# Patient Record
Sex: Male | Born: 1965 | Race: Black or African American | Hispanic: No | Smoking: Never smoker
Health system: Southern US, Community
[De-identification: ages and names within clinical notes are randomized; demographics above are authoritative.]

## PROBLEM LIST (undated history)

## (undated) DIAGNOSIS — M810 Age-related osteoporosis without current pathological fracture: Secondary | ICD-10-CM

## (undated) DIAGNOSIS — I10 Essential (primary) hypertension: Secondary | ICD-10-CM

## (undated) DIAGNOSIS — M1990 Unspecified osteoarthritis, unspecified site: Secondary | ICD-10-CM

## (undated) DIAGNOSIS — M069 Rheumatoid arthritis, unspecified: Secondary | ICD-10-CM

## (undated) DIAGNOSIS — T7840XA Allergy, unspecified, initial encounter: Secondary | ICD-10-CM

## (undated) HISTORY — PX: FOOT SURGERY: SHX648

## (undated) HISTORY — DX: Allergy, unspecified, initial encounter: T78.40XA

## (undated) HISTORY — DX: Rheumatoid arthritis, unspecified: M06.9

## (undated) HISTORY — DX: Essential (primary) hypertension: I10

## (undated) HISTORY — DX: Age-related osteoporosis without current pathological fracture: M81.0

---

## 2001-06-09 HISTORY — PX: KNEE SURGERY: SHX244

## 2003-03-30 ENCOUNTER — Encounter (INDEPENDENT_AMBULATORY_CARE_PROVIDER_SITE_OTHER): Payer: Self-pay | Admitting: Specialist

## 2003-03-30 ENCOUNTER — Ambulatory Visit (HOSPITAL_COMMUNITY): Admission: RE | Admit: 2003-03-30 | Discharge: 2003-03-30 | Payer: Self-pay | Admitting: Specialist

## 2006-08-28 ENCOUNTER — Emergency Department (HOSPITAL_COMMUNITY): Admission: EM | Admit: 2006-08-28 | Discharge: 2006-08-28 | Payer: Self-pay | Admitting: Emergency Medicine

## 2008-12-07 ENCOUNTER — Emergency Department (HOSPITAL_COMMUNITY): Admission: EM | Admit: 2008-12-07 | Discharge: 2008-12-07 | Payer: Self-pay | Admitting: Family Medicine

## 2009-05-08 ENCOUNTER — Emergency Department (HOSPITAL_COMMUNITY): Admission: EM | Admit: 2009-05-08 | Discharge: 2009-05-08 | Payer: Self-pay | Admitting: Emergency Medicine

## 2010-01-17 ENCOUNTER — Encounter: Admission: RE | Admit: 2010-01-17 | Discharge: 2010-01-17 | Payer: Self-pay | Admitting: Podiatry

## 2010-06-30 ENCOUNTER — Encounter: Payer: Self-pay | Admitting: Podiatry

## 2010-09-11 LAB — POCT URINALYSIS DIP (DEVICE)
Bilirubin Urine: NEGATIVE
Glucose, UA: NEGATIVE mg/dL
Ketones, ur: 15 mg/dL — AB
Nitrite: NEGATIVE
Protein, ur: 100 mg/dL — AB
Specific Gravity, Urine: 1.025 (ref 1.005–1.030)
Urobilinogen, UA: 0.2 mg/dL (ref 0.0–1.0)
pH: 6 (ref 5.0–8.0)

## 2010-09-11 LAB — DIFFERENTIAL
Basophils Absolute: 0.1 10*3/uL (ref 0.0–0.1)
Basophils Relative: 1 % (ref 0–1)
Eosinophils Absolute: 0 10*3/uL (ref 0.0–0.7)
Eosinophils Relative: 0 % (ref 0–5)
Lymphocytes Relative: 6 % — ABNORMAL LOW (ref 12–46)
Lymphs Abs: 0.7 10*3/uL (ref 0.7–4.0)
Monocytes Absolute: 0.2 10*3/uL (ref 0.1–1.0)
Monocytes Relative: 2 % — ABNORMAL LOW (ref 3–12)
Neutro Abs: 10.4 10*3/uL — ABNORMAL HIGH (ref 1.7–7.7)
Neutrophils Relative %: 91 % — ABNORMAL HIGH (ref 43–77)

## 2010-09-11 LAB — CBC
HCT: 43.6 % (ref 39.0–52.0)
Hemoglobin: 14.6 g/dL (ref 13.0–17.0)
MCHC: 33.4 g/dL (ref 30.0–36.0)
MCV: 80.2 fL (ref 78.0–100.0)
Platelets: 239 10*3/uL (ref 150–400)
RBC: 5.44 MIL/uL (ref 4.22–5.81)
RDW: 13.7 % (ref 11.5–15.5)
WBC: 11.5 10*3/uL — ABNORMAL HIGH (ref 4.0–10.5)

## 2010-10-25 NOTE — Op Note (Signed)
NAME:  Anthony Boyd, Anthony Boyd                        ACCOUNT NO.:  [id number]   MEDICAL RECORD NO.:  [id number]                   PATIENT TYPE:  AMB   LOCATION:  DAY                                  FACILITY:  [facility]   PHYSICIAN:  Jene Every, M.D.                 DATE OF BIRTH:  08-Feb-1966   DATE OF PROCEDURE:  03/30/2003  DATE OF DISCHARGE:                                 OPERATIVE REPORT   PREOPERATIVE DIAGNOSIS:  Lateral meniscus tear, degenerative joint disease  of the left knee.   POSTOPERATIVE DIAGNOSIS:  Grade 3 chondromalacia lateral tibial plateau,  grade 3 chondromalacia of the patella, medial femoral condyle, lateral  meniscus tear, hypertrophic synovitis.   PROCEDURE PERFORMED:  Left knee arthroscopy, partial lateral meniscectomy,  chondroplasty of the lateral tibial plateau, medial femoral condyle, and  patella, synovial biopsy.   BRIEF HISTORY AND INDICATIONS:  45 year old with refractory knee pain.  MRI  indicating chondromalacia and possible meniscal tear.  Operative  intervention was indicated for diagnosis and treatment.  The risks and  benefits were discussed including bleeding, infection, no changes, etc.   SURGICAL TECHNIQUE:  With the patient in the supine position, after adequate  general anesthesia and 1 gram Kefzol, the left lower extremity was prepped  and draped in the usual sterile fashion.  A lateral parapatellar portal and  a superomedial parapatellar portal was fashioned with a #11 blade.  The  instruments were atraumatically placed.  Irrigant was utilized to insufflate  the joint.  Under direct visualization, the medial parapatellar portal was  fashioned with a #11 blade after localization with the 18 gauge needle  sparring the medial meniscus.  Examination of the suprapatellar pouch  revealed grade 3 chondromalacia of the patella, normal patellofemoral  tracking.  The gutters are unremarkable.  Examination of the intercondylar  notch revealed  hypertrophic synovitis that was fairly significant,  therefore, I introduced a pituitary and performed synovial biopsy and sent  to pathology.  There was an extensive grade 3 lesion of the medial femoral  condyle with a chondral flap tear.  Loose cartilaginous debris was noted  throughout the medial lateral compartment.  The shaver was introduced and  chondroplasty of the patella and medial femoral condyle, lateral tibial  plateau.  The lateral tibial plateau there was a carpet like bunching of the  chondral surface which I debrided with a shaver.  There was also a tear in  the posterolateral meniscus.  I introduced the basket and resected to a  stable base and further contoured it with a shaver.  The remainder of the  medial and lateral menisci were stable without evidence of a tear.  I  debrided hypertrophic synovitis.  The ACL and PCL were unremarkable.  I  copiously lavaged the knee and re-examined all compartments.  No loose  cartilaginous debris or residual meniscal tears were amenable to  arthroscopic intervention.  The knee  was copiously lavaged, all  instrumentation was removed.  The portals were closed with 4-0 nylon simple  sutures.  0.25% Marcaine with epinephrine was infiltrated in the joint.  The  wound was dressed sterilely.  The patient was awakened without difficulty  and transported to the recovery room in satisfactory condition.  The patient  tolerated the procedure well with no complications.                                               Jene Every, M.D.    Cordelia Pen  D:  03/30/2003  T:  03/30/2003  Job:  [id number]

## 2011-11-12 ENCOUNTER — Encounter (INDEPENDENT_AMBULATORY_CARE_PROVIDER_SITE_OTHER): Payer: Self-pay

## 2011-11-26 ENCOUNTER — Encounter (INDEPENDENT_AMBULATORY_CARE_PROVIDER_SITE_OTHER): Payer: Self-pay | Admitting: General Surgery

## 2011-11-27 ENCOUNTER — Ambulatory Visit (INDEPENDENT_AMBULATORY_CARE_PROVIDER_SITE_OTHER): Payer: BC Managed Care – PPO | Admitting: General Surgery

## 2012-11-24 ENCOUNTER — Ambulatory Visit (INDEPENDENT_AMBULATORY_CARE_PROVIDER_SITE_OTHER): Payer: BC Managed Care – PPO | Admitting: General Surgery

## 2012-11-29 ENCOUNTER — Ambulatory Visit (INDEPENDENT_AMBULATORY_CARE_PROVIDER_SITE_OTHER): Payer: BC Managed Care – PPO | Admitting: General Surgery

## 2012-12-16 ENCOUNTER — Encounter (INDEPENDENT_AMBULATORY_CARE_PROVIDER_SITE_OTHER): Payer: Self-pay | Admitting: General Surgery

## 2013-11-14 ENCOUNTER — Telehealth: Payer: Self-pay | Admitting: *Deleted

## 2013-11-14 NOTE — Telephone Encounter (Signed)
Patient returned my call.  I need a prescription refill I think it's for Demerol.  I informed him he hasn't been seen in a while.  He will need to call back on tomorrow and schedule an appointment with Dr. Ralene Cork.  He stated okay, I'll do that.

## 2013-11-14 NOTE — Telephone Encounter (Signed)
Calling about getting (message was muffled couldn't understand what was being said)   I left him a message to return my call.

## 2013-11-28 ENCOUNTER — Encounter (INDEPENDENT_AMBULATORY_CARE_PROVIDER_SITE_OTHER): Payer: Self-pay | Admitting: Surgery

## 2013-12-07 ENCOUNTER — Ambulatory Visit (INDEPENDENT_AMBULATORY_CARE_PROVIDER_SITE_OTHER): Payer: BC Managed Care – PPO | Admitting: Surgery

## 2013-12-11 ENCOUNTER — Other Ambulatory Visit: Payer: Self-pay

## 2015-07-24 ENCOUNTER — Other Ambulatory Visit: Payer: Self-pay | Admitting: Rheumatology

## 2015-07-24 DIAGNOSIS — M25561 Pain in right knee: Secondary | ICD-10-CM

## 2015-07-29 ENCOUNTER — Ambulatory Visit
Admission: RE | Admit: 2015-07-29 | Discharge: 2015-07-29 | Disposition: A | Discharge status: Home / Self Care | Payer: BLUE CROSS/BLUE SHIELD | Source: Ambulatory Visit | Attending: Rheumatology | Admitting: Rheumatology

## 2015-07-29 DIAGNOSIS — M25561 Pain in right knee: Secondary | ICD-10-CM

## 2015-09-11 DIAGNOSIS — Z125 Encounter for screening for malignant neoplasm of prostate: Secondary | ICD-10-CM | POA: Diagnosis not present

## 2015-09-11 DIAGNOSIS — Z13220 Encounter for screening for lipoid disorders: Secondary | ICD-10-CM | POA: Diagnosis not present

## 2015-09-11 DIAGNOSIS — Z0001 Encounter for general adult medical examination with abnormal findings: Secondary | ICD-10-CM | POA: Diagnosis not present

## 2015-09-18 DIAGNOSIS — M0579 Rheumatoid arthritis with rheumatoid factor of multiple sites without organ or systems involvement: Secondary | ICD-10-CM | POA: Diagnosis not present

## 2015-09-18 DIAGNOSIS — R7303 Prediabetes: Secondary | ICD-10-CM | POA: Diagnosis not present

## 2015-09-18 DIAGNOSIS — I10 Essential (primary) hypertension: Secondary | ICD-10-CM | POA: Diagnosis not present

## 2015-09-18 DIAGNOSIS — Z0000 Encounter for general adult medical examination without abnormal findings: Secondary | ICD-10-CM | POA: Diagnosis not present

## 2015-10-09 DIAGNOSIS — M25561 Pain in right knee: Secondary | ICD-10-CM | POA: Diagnosis not present

## 2015-10-24 DIAGNOSIS — M25561 Pain in right knee: Secondary | ICD-10-CM | POA: Diagnosis not present

## 2015-12-10 DIAGNOSIS — Z79899 Other long term (current) drug therapy: Secondary | ICD-10-CM | POA: Diagnosis not present

## 2015-12-10 DIAGNOSIS — M0579 Rheumatoid arthritis with rheumatoid factor of multiple sites without organ or systems involvement: Secondary | ICD-10-CM | POA: Diagnosis not present

## 2015-12-10 DIAGNOSIS — M25561 Pain in right knee: Secondary | ICD-10-CM | POA: Diagnosis not present

## 2015-12-10 DIAGNOSIS — M179 Osteoarthritis of knee, unspecified: Secondary | ICD-10-CM | POA: Diagnosis not present

## 2015-12-19 DIAGNOSIS — M1711 Unilateral primary osteoarthritis, right knee: Secondary | ICD-10-CM | POA: Diagnosis not present

## 2016-01-07 DIAGNOSIS — M179 Osteoarthritis of knee, unspecified: Secondary | ICD-10-CM | POA: Diagnosis not present

## 2016-01-07 DIAGNOSIS — Z79899 Other long term (current) drug therapy: Secondary | ICD-10-CM | POA: Diagnosis not present

## 2016-01-07 DIAGNOSIS — M0579 Rheumatoid arthritis with rheumatoid factor of multiple sites without organ or systems involvement: Secondary | ICD-10-CM | POA: Diagnosis not present

## 2016-01-07 DIAGNOSIS — M25561 Pain in right knee: Secondary | ICD-10-CM | POA: Diagnosis not present

## 2016-03-10 ENCOUNTER — Ambulatory Visit (INDEPENDENT_AMBULATORY_CARE_PROVIDER_SITE_OTHER): Payer: Self-pay | Admitting: Orthopaedic Surgery

## 2016-04-07 DIAGNOSIS — M0579 Rheumatoid arthritis with rheumatoid factor of multiple sites without organ or systems involvement: Secondary | ICD-10-CM | POA: Diagnosis not present

## 2016-04-07 DIAGNOSIS — Z23 Encounter for immunization: Secondary | ICD-10-CM | POA: Diagnosis not present

## 2016-04-07 DIAGNOSIS — M179 Osteoarthritis of knee, unspecified: Secondary | ICD-10-CM | POA: Diagnosis not present

## 2016-04-07 DIAGNOSIS — I10 Essential (primary) hypertension: Secondary | ICD-10-CM | POA: Diagnosis not present

## 2016-10-16 DIAGNOSIS — Z0000 Encounter for general adult medical examination without abnormal findings: Secondary | ICD-10-CM | POA: Diagnosis not present

## 2016-10-16 DIAGNOSIS — Z79899 Other long term (current) drug therapy: Secondary | ICD-10-CM | POA: Diagnosis not present

## 2016-10-16 DIAGNOSIS — M179 Osteoarthritis of knee, unspecified: Secondary | ICD-10-CM | POA: Diagnosis not present

## 2016-10-16 DIAGNOSIS — M0579 Rheumatoid arthritis with rheumatoid factor of multiple sites without organ or systems involvement: Secondary | ICD-10-CM | POA: Diagnosis not present

## 2016-10-16 DIAGNOSIS — M6530 Trigger finger, unspecified finger: Secondary | ICD-10-CM | POA: Diagnosis not present

## 2017-02-04 DIAGNOSIS — M0579 Rheumatoid arthritis with rheumatoid factor of multiple sites without organ or systems involvement: Secondary | ICD-10-CM | POA: Diagnosis not present

## 2017-02-04 DIAGNOSIS — M179 Osteoarthritis of knee, unspecified: Secondary | ICD-10-CM | POA: Diagnosis not present

## 2017-02-04 DIAGNOSIS — M6530 Trigger finger, unspecified finger: Secondary | ICD-10-CM | POA: Diagnosis not present

## 2017-02-04 DIAGNOSIS — Z79899 Other long term (current) drug therapy: Secondary | ICD-10-CM | POA: Diagnosis not present

## 2017-06-08 DIAGNOSIS — H40033 Anatomical narrow angle, bilateral: Secondary | ICD-10-CM | POA: Diagnosis not present

## 2017-06-08 DIAGNOSIS — H04123 Dry eye syndrome of bilateral lacrimal glands: Secondary | ICD-10-CM | POA: Diagnosis not present

## 2017-06-10 DIAGNOSIS — Z0000 Encounter for general adult medical examination without abnormal findings: Secondary | ICD-10-CM | POA: Diagnosis not present

## 2017-06-10 DIAGNOSIS — I10 Essential (primary) hypertension: Secondary | ICD-10-CM | POA: Diagnosis not present

## 2017-06-10 DIAGNOSIS — R7303 Prediabetes: Secondary | ICD-10-CM | POA: Diagnosis not present

## 2017-06-17 ENCOUNTER — Ambulatory Visit (INDEPENDENT_AMBULATORY_CARE_PROVIDER_SITE_OTHER): Payer: BLUE CROSS/BLUE SHIELD | Admitting: Physician Assistant

## 2017-06-17 ENCOUNTER — Ambulatory Visit (INDEPENDENT_AMBULATORY_CARE_PROVIDER_SITE_OTHER): Payer: Self-pay

## 2017-06-17 ENCOUNTER — Encounter (INDEPENDENT_AMBULATORY_CARE_PROVIDER_SITE_OTHER): Payer: Self-pay | Admitting: Physician Assistant

## 2017-06-17 VITALS — Ht 66.0 in | Wt 241.0 lb

## 2017-06-17 DIAGNOSIS — Z0000 Encounter for general adult medical examination without abnormal findings: Secondary | ICD-10-CM | POA: Diagnosis not present

## 2017-06-17 DIAGNOSIS — Z23 Encounter for immunization: Secondary | ICD-10-CM | POA: Diagnosis not present

## 2017-06-17 DIAGNOSIS — M25562 Pain in left knee: Secondary | ICD-10-CM | POA: Diagnosis not present

## 2017-06-17 DIAGNOSIS — M0579 Rheumatoid arthritis with rheumatoid factor of multiple sites without organ or systems involvement: Secondary | ICD-10-CM | POA: Diagnosis not present

## 2017-06-17 DIAGNOSIS — I10 Essential (primary) hypertension: Secondary | ICD-10-CM | POA: Diagnosis not present

## 2017-06-17 DIAGNOSIS — R7303 Prediabetes: Secondary | ICD-10-CM | POA: Diagnosis not present

## 2017-06-17 NOTE — Progress Notes (Signed)
Office Visit Note   Patient: Anthony Boyd           Date of Birth: [birth year]           MRN: [id number] Visit Date: 06/17/2017              Requested by: No referring provider defined for this encounter. PCP: Anthony Fick, MD   Assessment & Plan: Visit Diagnoses:  1. Acute pain of left knee     Plan: We will have him return once supplemental injection has been approved .  Do feel that this is definitely something that can be beneficial and prolonged him from having a total knee replacement as is done very well on the right knee which she has rheumatoid arthritis that is very similar in radiographic appearance.  Questions were encouraged and answered at length.  Follow-Up Instructions: Return for Supplemental injection.   Orders:  Orders Placed This Encounter  Procedures  . XR KNEE 3 VIEW LEFT   No orders of the defined types were placed in this encounter.     Procedures: No procedures performed   Clinical Data: No additional findings.   Subjective: Chief Complaint  Patient presents with  . Left Knee - Pain    HPI Mr. Anthony Boyd is well-known to Dr. Magnus Boyd service is not been seen in July 2017.  In the summer 2017 he did receive some is one injection in his right knee and his right knee overall is doing well some pain periodically.  Comes in mainly today due to left knee pain is been worse over the last month.  Has a history of left knee arthroscopy some 10-15 years ago.  He does have rheumatoid arthritis.  Said no known injury to the left knee.  He states he has a lot of swelling and aching of the left knee after work and he works on concrete floors daily.  Does use brace at times.  On Celebrex methotrexate and Humira.   Review of Systems See HPI otherwise negative  Objective: Vital Signs: Ht 5\' 6"  (1.676 m)   Wt 241 lb (109.3 kg)   BMI 38.90 kg/m   Physical Exam  Constitutional: He is oriented to person, place, and time. He appears well-developed  and well-nourished. No distress.  Pulmonary/Chest: Effort normal.  Neurological: He is alert and oriented to person, place, and time.  Skin: He is not diaphoretic.  Psychiatric: He has a normal mood and affect.    Ortho Exam Right knee good range of motion without pain.  Significant crepitus with passive range of motion.  No instability valgus varus stressing.  Left knee has tenderness along medial lateral joint line.  No effusion abnormal erythema.  No instability valgus varus stressing.  Significant crepitus with passive range of motion Specialty Comments:  No specialty comments available.  Imaging: Xr Knee 3 View Left  Result Date: 06/17/2017 AP left knee lateral view and sunrise view: No acute fracture.  Tricompartmental arthritis with bone-on-bone medial compartment moderate severe lateral compartmental arthritis and severe patellofemoral arthritis.  Knee is well located.    PMFS History: There are no active problems to display for this patient.  Past Medical History:  Diagnosis Date  . Allergy   . Hypertension   . Osteoporosis   . Rheumatoid arthritis(714.0)     Family History  Problem Relation Age of Onset  . Hypertension Mother   . Hypertension Father     Past Surgical History:  Procedure Laterality Date  .  FOOT SURGERY  1998, 2001  . KNEE SURGERY  2003   left   Social History   Occupational History  . Not on file  Tobacco Use  . Smoking status: Never Smoker  . Smokeless tobacco: Never Used  Substance and Sexual Activity  . Alcohol use: No  . Drug use: No  . Sexual activity: Not on file

## 2017-06-22 ENCOUNTER — Ambulatory Visit (INDEPENDENT_AMBULATORY_CARE_PROVIDER_SITE_OTHER): Payer: Self-pay | Admitting: Physician Assistant

## 2017-06-29 DIAGNOSIS — Z1211 Encounter for screening for malignant neoplasm of colon: Secondary | ICD-10-CM | POA: Diagnosis not present

## 2017-07-07 ENCOUNTER — Telehealth (INDEPENDENT_AMBULATORY_CARE_PROVIDER_SITE_OTHER): Payer: Self-pay | Admitting: Orthopaedic Surgery

## 2017-07-07 NOTE — Telephone Encounter (Signed)
[name] from [employer] called stating that the insurance requires a preauthorization for the Syn[employer] injection.  She has checked with the patient's insurance and there is not one on file.  CB#[phone].  Thank you.

## 2017-07-07 NOTE — Telephone Encounter (Signed)
Faxed form for PA

## 2017-07-13 ENCOUNTER — Telehealth (INDEPENDENT_AMBULATORY_CARE_PROVIDER_SITE_OTHER): Payer: Self-pay | Admitting: Orthopaedic Surgery

## 2017-07-13 NOTE — Telephone Encounter (Signed)
Patient called asked if the insurance approved for him to have an injection in his left knee. The number to contact patient is [phone]

## 2017-07-14 ENCOUNTER — Telehealth (INDEPENDENT_AMBULATORY_CARE_PROVIDER_SITE_OTHER): Payer: Self-pay | Admitting: Orthopaedic Surgery

## 2017-07-14 DIAGNOSIS — K635 Polyp of colon: Secondary | ICD-10-CM | POA: Diagnosis not present

## 2017-07-14 DIAGNOSIS — D122 Benign neoplasm of ascending colon: Secondary | ICD-10-CM | POA: Diagnosis not present

## 2017-07-14 DIAGNOSIS — Z1211 Encounter for screening for malignant neoplasm of colon: Secondary | ICD-10-CM | POA: Diagnosis not present

## 2017-07-14 NOTE — Telephone Encounter (Signed)
Yes, this has been approved, we can schedule him for appt

## 2017-07-14 NOTE — Telephone Encounter (Signed)
Patient scheduled for injection next Thursday 02/14.

## 2017-07-14 NOTE — Telephone Encounter (Signed)
Called patient left message to return call to schedule appointment for injection

## 2017-07-22 IMAGING — MR MR KNEE*R* W/O CM
4 of 5 series · 28 of 40 positions shown · non-contrast
Comparison: Plain films right knee 08/31/2014 and 07/23/2015.

CLINICAL DATA: Anterior right knee pain for 1 year. No known
injury. Initial encounter.

EXAM:
MRI OF THE RIGHT KNEE WITHOUT CONTRAST
TECHNIQUE: Multiplanar, multisequence MR imaging of the knee was performed. No
intravenous contrast was administered.

[Series 3: PD fat-sat · coronal · 4.0mm · 0.31mm/px · 9 of 24 slices shown (1 of 3)]
[im 1/24]
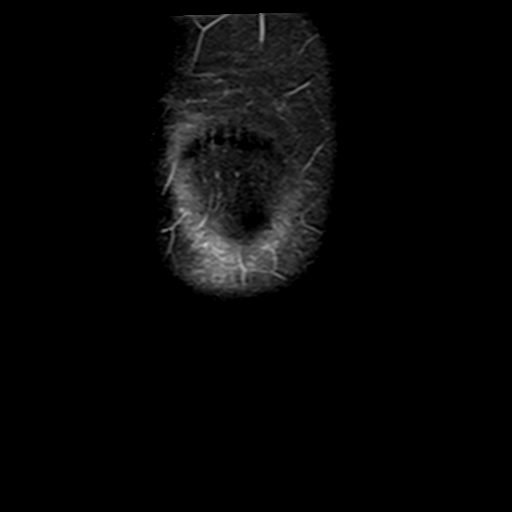
[im 3/24]
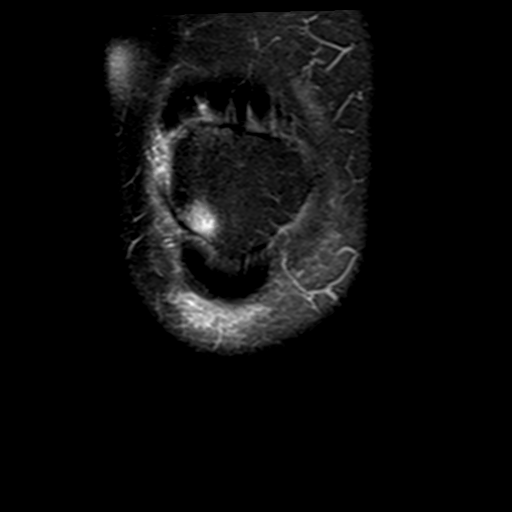
[im 6/24]
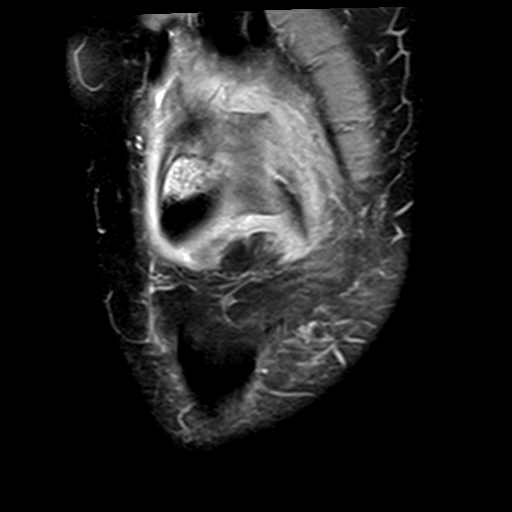
[im 9/24]
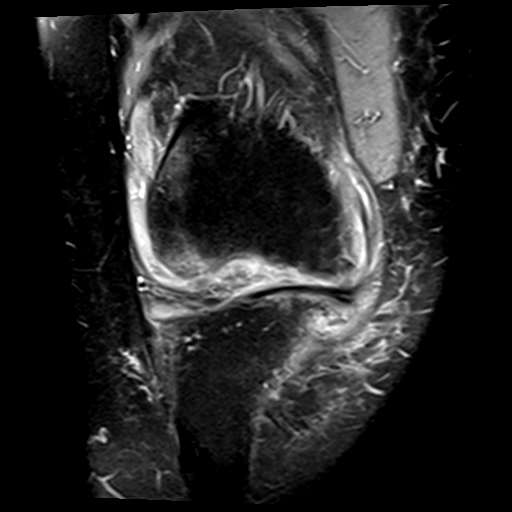
[im 12/24]
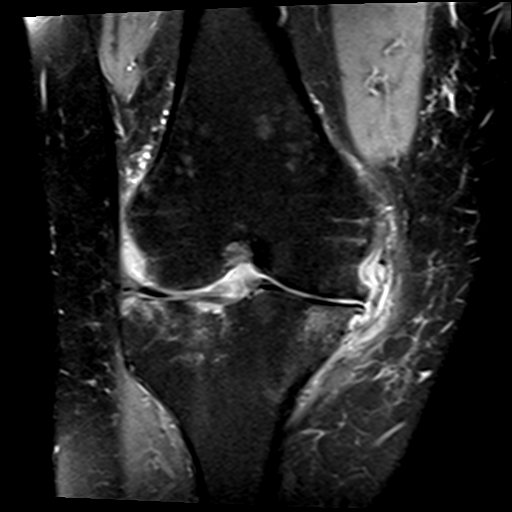
[im 15/24]
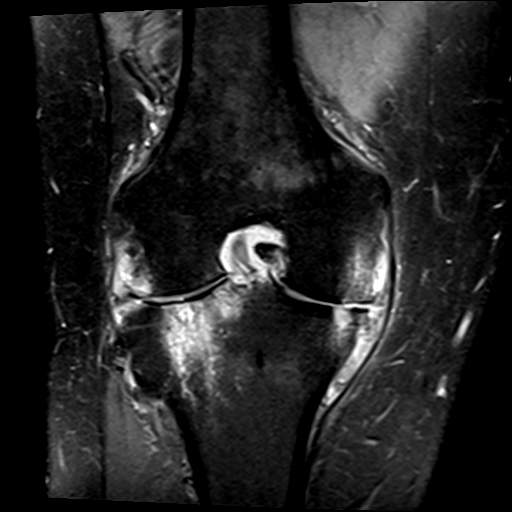
[im 18/24]
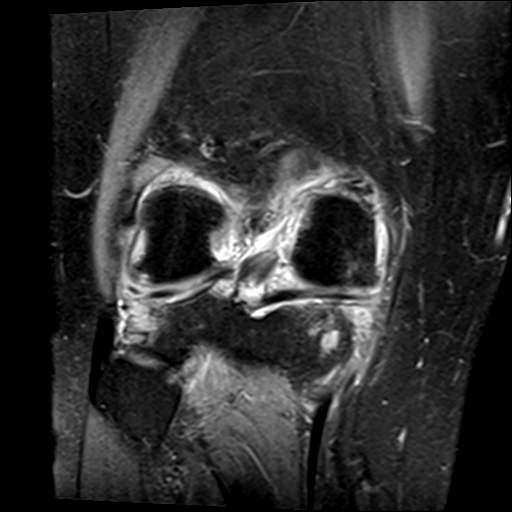
[im 21/24]
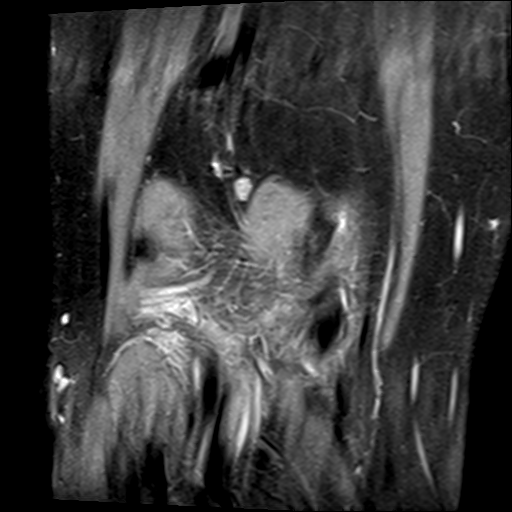
[im 24/24]
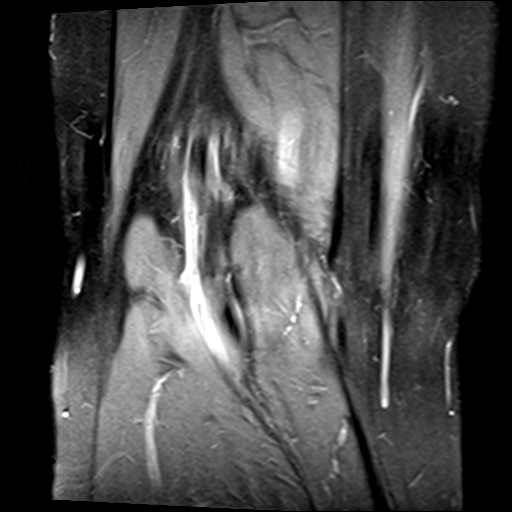

[Series 4: T2 fat-sat · coronal · 4.0mm · 0.31mm/px · 4 of 24 slices shown]
[im 1/24]
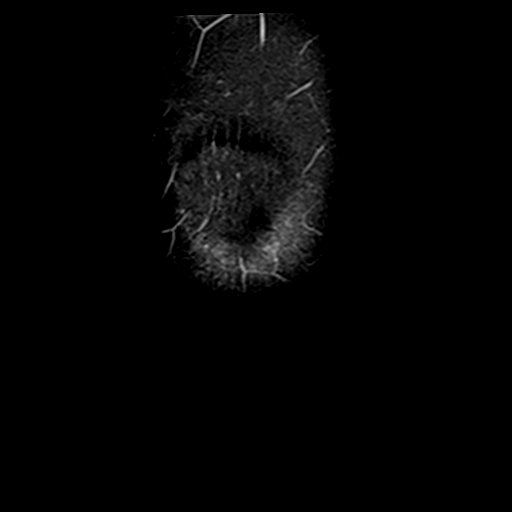
[im 4/24]
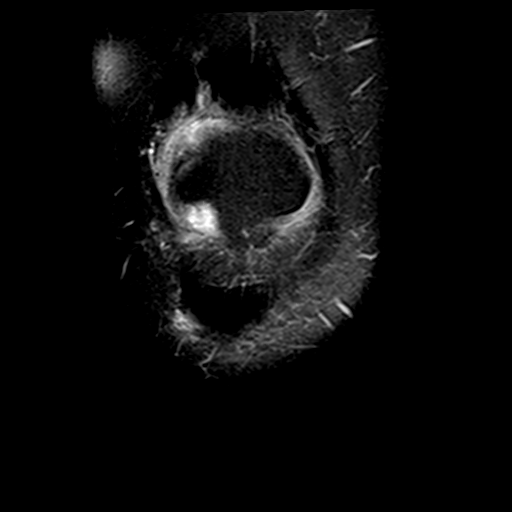
[im 14/24]
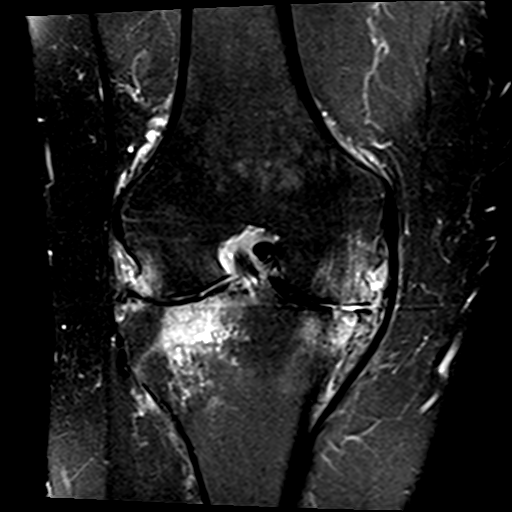
[im 20/24]
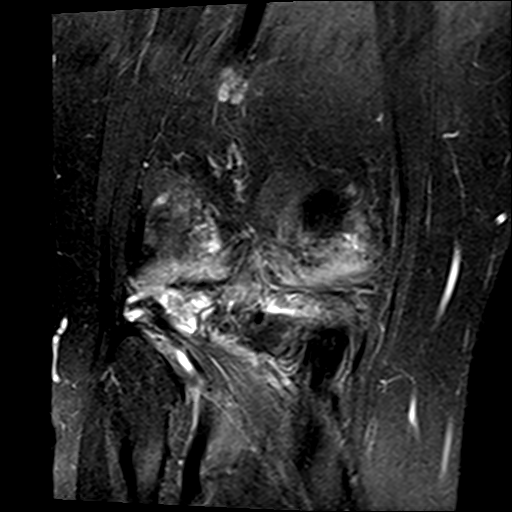

[Series 6: PD fat-sat · sagittal · 4.0mm · 0.50mm/px · 7 of 20 slices shown (2 of 3)]
[im 1/20]
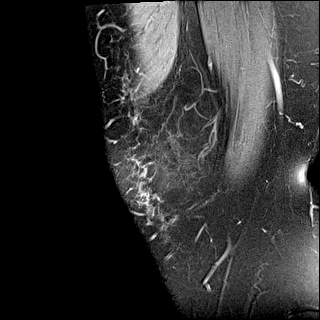
[im 4/20]
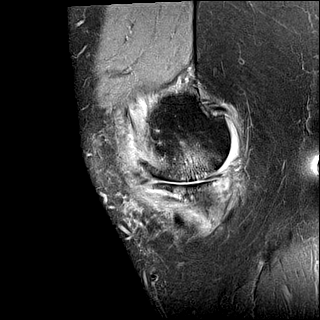
[im 7/20]
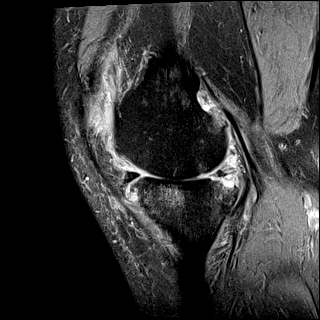
[im 10/20]
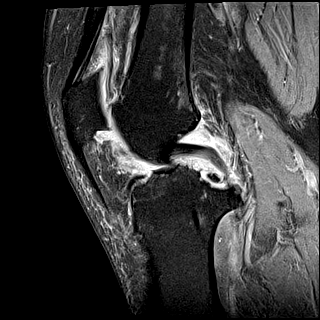
[im 13/20]
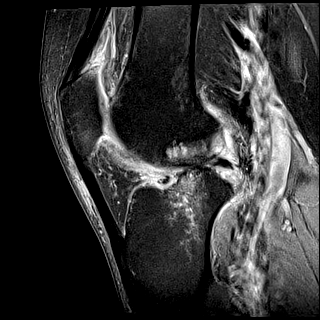
[im 16/20]
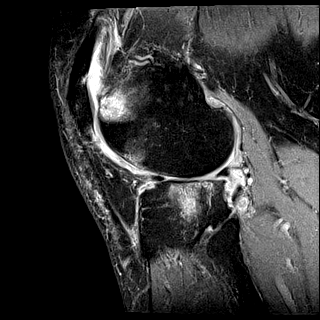
[im 20/20]
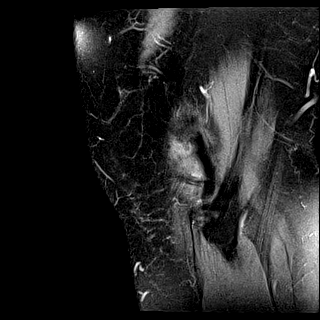

[Series 7: PD fat-sat · axial · 3.5mm · 0.62mm/px · z∈[-46,+43]mm · 8 of 24 slices shown (3 of 3)]
[im 1/24]
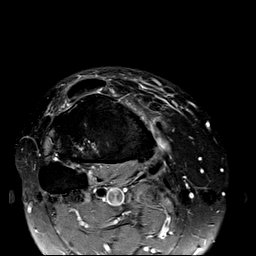
[im 4/24]
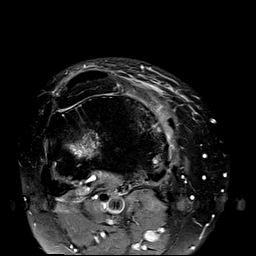
[im 7/24]
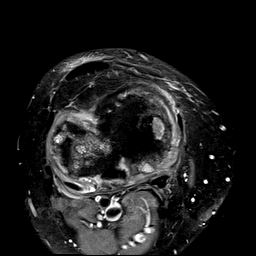
[im 10/24]
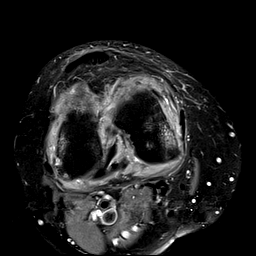
[im 14/24]
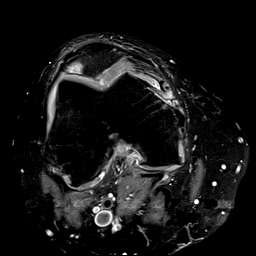
[im 17/24]
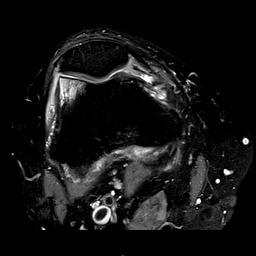
[im 20/24]
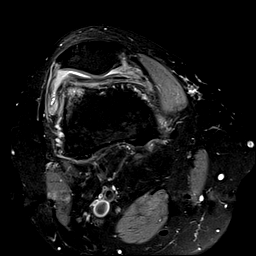
[im 24/24]
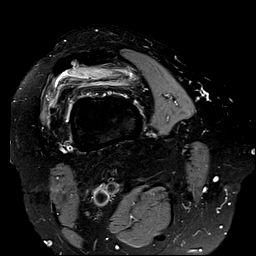

[28 of 40 positions shown; findings below may reference images not displayed]

FINDINGS: MENISCI

Medial meniscus: The posterior horn and body of the medial meniscus
are severely degenerated. No normal-appearing meniscal tissue is
seen in the body.

Lateral meniscus: The body of the medial meniscus is severely
degenerated and diminutive. Only a small remnant of the body is
identified and it is extruded out of the joint peripherally.

LIGAMENTS

Cruciates:  Intact.

Collaterals:  Intact.

CARTILAGE

Patellofemoral: Hyaline cartilage loss is most notable along the
lateral patellar facet and femoral trochlea where there is
associated subchondral cyst formation.

Medial: Cartilage is denuded with bone-on-bone joint space
narrowing. Extensive associated subchondral edema is identified.

Lateral: Hyaline cartilage is nearly completely denuded with
associated extensive subchondral edema, particularly in the tibial
plateau.

Joint:  Small joint effusion.

Popliteal Fossa:  No Baker's cyst.

Extensor Mechanism:  Intact.

Bones: As described above. No fracture or worrisome marrow lesion.
Osteophytosis about the knee is identified.
IMPRESSION: Dominant finding is severe and markedly advanced for age
tricompartmental osteoarthritis appearing worst in the medial and
lateral compartments.

Severely degenerated and diminutive posterior horn and body of the
medial meniscus and body of the lateral meniscus as described above.

## 2017-07-23 ENCOUNTER — Ambulatory Visit (INDEPENDENT_AMBULATORY_CARE_PROVIDER_SITE_OTHER): Payer: BLUE CROSS/BLUE SHIELD | Admitting: Physician Assistant

## 2017-07-27 ENCOUNTER — Ambulatory Visit (INDEPENDENT_AMBULATORY_CARE_PROVIDER_SITE_OTHER): Payer: BLUE CROSS/BLUE SHIELD | Admitting: Physician Assistant

## 2017-07-27 ENCOUNTER — Encounter (INDEPENDENT_AMBULATORY_CARE_PROVIDER_SITE_OTHER): Payer: Self-pay | Admitting: Physician Assistant

## 2017-07-27 DIAGNOSIS — M1710 Unilateral primary osteoarthritis, unspecified knee: Secondary | ICD-10-CM | POA: Diagnosis not present

## 2017-07-27 MED ORDER — HYLAN G-F 20 48 MG/6ML IX SOSY
48.0000 mg | PREFILLED_SYRINGE | INTRA_ARTICULAR | Status: AC | PRN
  Administered 2017-07-27: 48 mg via INTRA_ARTICULAR

## 2017-07-27 NOTE — Progress Notes (Signed)
   Procedure Note  Patient: Anthony Boyd             Date of Birth: 05-30-1966           MRN: [id number]             Visit Date: 07/27/2017 HPI: [name] is well-known to Dr. Magnus Ivan service comes in today for a Synvisc 1 injection of his left knee.  He states he is having pain lateral and medial aspects of the knee.  Pain is worse with standing for prolonged periods time and worse with early morning.  Review of systems: Denies any fevers chills recent falls. Procedures: Visit Diagnoses: Arthritis of knee  Large Joint Inj: L knee on 07/27/2017 4:03 PM Indications: pain Details: 22 G 1.5 in needle, anterolateral approach  Arthrogram: No  Medications: 48 mg Hylan 48 MG/6ML Outcome: tolerated well, no immediate complications Procedure, treatment alternatives, risks and benefits explained, specific risks discussed. Consent was given by the patient. Immediately prior to procedure a time out was called to verify the correct patient, procedure, equipment, support staff and site/side marked as required. Patient was prepped and draped in the usual sterile fashion.     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  We will see him back in 8 weeks to see what type of response he had to the Synvisc 1 injection.  We did write him out of work until Wednesday when he can return to work full duties.

## 2017-08-04 ENCOUNTER — Telehealth (INDEPENDENT_AMBULATORY_CARE_PROVIDER_SITE_OTHER): Payer: Self-pay | Admitting: Physician Assistant

## 2017-08-04 NOTE — Telephone Encounter (Signed)
Patient called wanting to know if his knee is suppose to be stiff after getting an injection.  CB#[phone].  Thank you.

## 2017-08-05 NOTE — Telephone Encounter (Signed)
Please advise 

## 2017-08-05 NOTE — Telephone Encounter (Signed)
Yes this is normal

## 2017-08-07 NOTE — Telephone Encounter (Signed)
LMOM for patient this is normal

## 2017-09-08 ENCOUNTER — Ambulatory Visit (INDEPENDENT_AMBULATORY_CARE_PROVIDER_SITE_OTHER): Payer: BLUE CROSS/BLUE SHIELD | Admitting: Orthopaedic Surgery

## 2017-09-08 ENCOUNTER — Encounter (INDEPENDENT_AMBULATORY_CARE_PROVIDER_SITE_OTHER): Payer: Self-pay | Admitting: Orthopaedic Surgery

## 2017-09-08 DIAGNOSIS — M1711 Unilateral primary osteoarthritis, right knee: Secondary | ICD-10-CM | POA: Diagnosis not present

## 2017-09-08 DIAGNOSIS — M1712 Unilateral primary osteoarthritis, left knee: Secondary | ICD-10-CM | POA: Diagnosis not present

## 2017-09-08 NOTE — Progress Notes (Signed)
The patient is only 52 years old.  He is 6 weeks status post a Synvisc injection with hyaluronic acid in his left knee.  He has known severe tricompartment arthritis of both his knees.  A supplement injection worked well in his right knee.  He says is an hour as well as left knee.  He still has stiffness with pain and swelling.  On exam both knees have slight varus malalignment.  The left knee is more on the right knee.  The left knee does have a mild effusion.  Both knees have patellofemoral position with medial joint line tenderness.  X-rays reviewed again with him of both his knees showing severe tricompartmental arthritis of both his knees.  Both knees have varus malalignment and complete loss of medial joint space.  At this point the only option for him other than doing nothing is considering knee replacement surgery.  I showed him the knee model and explained in detail what this involves.  He said he will think about this and let us know.  He is already on Humira and Celebrex.  All questions concerns were answered and addressed.

## 2017-09-24 ENCOUNTER — Encounter (INDEPENDENT_AMBULATORY_CARE_PROVIDER_SITE_OTHER): Payer: Self-pay | Admitting: Physician Assistant

## 2017-09-24 ENCOUNTER — Ambulatory Visit (INDEPENDENT_AMBULATORY_CARE_PROVIDER_SITE_OTHER): Payer: BLUE CROSS/BLUE SHIELD | Admitting: Physician Assistant

## 2017-09-24 DIAGNOSIS — M1712 Unilateral primary osteoarthritis, left knee: Secondary | ICD-10-CM | POA: Diagnosis not present

## 2017-09-24 MED ORDER — LIDOCAINE HCL 1 % IJ SOLN
3.0000 mL | INTRAMUSCULAR | Status: AC | PRN
  Administered 2017-09-24: 3 mL

## 2017-09-24 MED ORDER — METHYLPREDNISOLONE ACETATE 40 MG/ML IJ SUSP
40.0000 mg | INTRAMUSCULAR | Status: AC | PRN
  Administered 2017-09-24: 40 mg via INTRA_ARTICULAR

## 2017-09-24 NOTE — Progress Notes (Signed)
   Procedure Note  Patient: Anthony Boyd             Date of Birth: [birth year]           MRN: [id number]             Visit Date: 09/24/2017 HPI Sasuke returns today back surgery nicely 8 weeks then his pain he was seen is an injection left knee.  Is having some popping in the knee.  He is requesting a cortisone injection in the knee today.  Pain and stiffness in the knee.  He states if he just feels like the knee needs to pop.  Again patient has severe tricompartmental arthritis of both knees.  He has had no new injury to either knee.  Physical exam: Left knee no effusion abnormal warmth erythema.  Port sites well-healed from prior knee arthroscopy.  He has full extension and flexion to approximately 105 degrees.  No instability valgus varus stressing. Procedures: Visit Diagnoses: Unilateral primary osteoarthritis, left knee  Large Joint Inj: L knee on 09/24/2017 4:15 PM Indications: pain Details: 22 G 1.5 in needle, anterolateral approach  Arthrogram: No  Medications: 3 mL lidocaine 1 %; 40 mg methylPREDNISolone acetate 40 MG/ML Outcome: tolerated well, no immediate complications Procedure, treatment alternatives, risks and benefits explained, specific risks discussed. Consent was given by the patient. Immediately prior to procedure a time out was called to verify the correct patient, procedure, equipment, support staff and site/side marked as required. Patient was prepped and draped in the usual sterile fashion.     Plan: He understands that he can only have cortisone injections every 3 months.  He is work on Dance movement psychotherapist.  Follow-up as needed.

## 2017-11-26 DIAGNOSIS — M5442 Lumbago with sciatica, left side: Secondary | ICD-10-CM | POA: Diagnosis not present

## 2017-12-29 DIAGNOSIS — R7303 Prediabetes: Secondary | ICD-10-CM | POA: Diagnosis not present

## 2017-12-29 DIAGNOSIS — M0579 Rheumatoid arthritis with rheumatoid factor of multiple sites without organ or systems involvement: Secondary | ICD-10-CM | POA: Diagnosis not present

## 2017-12-29 DIAGNOSIS — I10 Essential (primary) hypertension: Secondary | ICD-10-CM | POA: Diagnosis not present

## 2017-12-29 DIAGNOSIS — Z125 Encounter for screening for malignant neoplasm of prostate: Secondary | ICD-10-CM | POA: Diagnosis not present

## 2018-01-06 DIAGNOSIS — M179 Osteoarthritis of knee, unspecified: Secondary | ICD-10-CM | POA: Diagnosis not present

## 2018-01-06 DIAGNOSIS — M0579 Rheumatoid arthritis with rheumatoid factor of multiple sites without organ or systems involvement: Secondary | ICD-10-CM | POA: Diagnosis not present

## 2018-01-06 DIAGNOSIS — R7303 Prediabetes: Secondary | ICD-10-CM | POA: Diagnosis not present

## 2018-01-06 DIAGNOSIS — K429 Umbilical hernia without obstruction or gangrene: Secondary | ICD-10-CM | POA: Diagnosis not present

## 2018-01-06 DIAGNOSIS — I10 Essential (primary) hypertension: Secondary | ICD-10-CM | POA: Diagnosis not present

## 2018-02-05 DIAGNOSIS — M25511 Pain in right shoulder: Secondary | ICD-10-CM | POA: Diagnosis not present

## 2018-02-19 DIAGNOSIS — Z79899 Other long term (current) drug therapy: Secondary | ICD-10-CM | POA: Diagnosis not present

## 2018-02-19 DIAGNOSIS — M179 Osteoarthritis of knee, unspecified: Secondary | ICD-10-CM | POA: Diagnosis not present

## 2018-02-19 DIAGNOSIS — M25561 Pain in right knee: Secondary | ICD-10-CM | POA: Diagnosis not present

## 2018-02-19 DIAGNOSIS — M0579 Rheumatoid arthritis with rheumatoid factor of multiple sites without organ or systems involvement: Secondary | ICD-10-CM | POA: Diagnosis not present

## 2018-04-08 DIAGNOSIS — M0579 Rheumatoid arthritis with rheumatoid factor of multiple sites without organ or systems involvement: Secondary | ICD-10-CM | POA: Diagnosis not present

## 2018-05-20 DIAGNOSIS — M7022 Olecranon bursitis, left elbow: Secondary | ICD-10-CM | POA: Diagnosis not present

## 2018-07-21 DIAGNOSIS — Z79899 Other long term (current) drug therapy: Secondary | ICD-10-CM | POA: Diagnosis not present

## 2018-07-21 DIAGNOSIS — I10 Essential (primary) hypertension: Secondary | ICD-10-CM | POA: Diagnosis not present

## 2018-07-21 DIAGNOSIS — Z13220 Encounter for screening for lipoid disorders: Secondary | ICD-10-CM | POA: Diagnosis not present

## 2018-07-21 DIAGNOSIS — R7303 Prediabetes: Secondary | ICD-10-CM | POA: Diagnosis not present

## 2018-07-27 DIAGNOSIS — Z79899 Other long term (current) drug therapy: Secondary | ICD-10-CM | POA: Diagnosis not present

## 2018-07-27 DIAGNOSIS — M0579 Rheumatoid arthritis with rheumatoid factor of multiple sites without organ or systems involvement: Secondary | ICD-10-CM | POA: Diagnosis not present

## 2018-07-27 DIAGNOSIS — M1712 Unilateral primary osteoarthritis, left knee: Secondary | ICD-10-CM | POA: Diagnosis not present

## 2018-07-27 DIAGNOSIS — M25562 Pain in left knee: Secondary | ICD-10-CM | POA: Diagnosis not present

## 2018-07-30 DIAGNOSIS — Z0000 Encounter for general adult medical examination without abnormal findings: Secondary | ICD-10-CM | POA: Diagnosis not present

## 2018-07-30 DIAGNOSIS — M0579 Rheumatoid arthritis with rheumatoid factor of multiple sites without organ or systems involvement: Secondary | ICD-10-CM | POA: Diagnosis not present

## 2018-07-30 DIAGNOSIS — I10 Essential (primary) hypertension: Secondary | ICD-10-CM | POA: Diagnosis not present

## 2018-07-30 DIAGNOSIS — R7303 Prediabetes: Secondary | ICD-10-CM | POA: Diagnosis not present

## 2018-10-27 DIAGNOSIS — M0579 Rheumatoid arthritis with rheumatoid factor of multiple sites without organ or systems involvement: Secondary | ICD-10-CM | POA: Diagnosis not present

## 2018-10-27 DIAGNOSIS — M1712 Unilateral primary osteoarthritis, left knee: Secondary | ICD-10-CM | POA: Diagnosis not present

## 2018-10-27 DIAGNOSIS — M25562 Pain in left knee: Secondary | ICD-10-CM | POA: Diagnosis not present

## 2018-10-27 DIAGNOSIS — Z79899 Other long term (current) drug therapy: Secondary | ICD-10-CM | POA: Diagnosis not present

## 2018-12-14 ENCOUNTER — Encounter: Payer: Self-pay | Admitting: Orthopaedic Surgery

## 2018-12-14 ENCOUNTER — Other Ambulatory Visit: Payer: Self-pay

## 2018-12-14 ENCOUNTER — Ambulatory Visit (INDEPENDENT_AMBULATORY_CARE_PROVIDER_SITE_OTHER): Payer: BC Managed Care – PPO | Admitting: Orthopaedic Surgery

## 2018-12-14 VITALS — Ht 66.0 in | Wt 246.0 lb

## 2018-12-14 DIAGNOSIS — M1712 Unilateral primary osteoarthritis, left knee: Secondary | ICD-10-CM

## 2018-12-14 DIAGNOSIS — M1711 Unilateral primary osteoarthritis, right knee: Secondary | ICD-10-CM

## 2018-12-14 NOTE — Progress Notes (Signed)
Office Visit Note   Patient: Anthony Boyd           Date of Birth: 09-Sep-1965           MRN: [id number] Visit Date: 12/14/2018              Requested by: Georgianne Fick, MD [address] [place],  [place] [address] PCP: Georgianne Fick, MD   Assessment & Plan: Visit Diagnoses:  1. Unilateral primary osteoarthritis, left knee   2. Unilateral primary osteoarthritis, right knee     Plan: I went over knee replacement with him in great detail showing him his x-rays and a knee replacement model.  We talked about the risk and benefits of surgery.  We discussed his interoperative and postoperative course as it relates to surgery.  I do feel that at this point it is reasonable to proceed with the surgery later this year.  He will continue to work on weight loss as well.  His BMI is 39.7 and I do feel that we are able to proceed with the surgery given his body habitus and what he looks like it is knees.  He is very motivated and will continue to lose weight.  I think that surgery will absolutely help him increase his mobility thereby improving his functional status and leading to more weight loss.  Follow-Up Instructions: Return for 2 weeks post-op.   Orders:  No orders of the defined types were placed in this encounter.  No orders of the defined types were placed in this encounter.     Procedures: No procedures performed   Clinical Data: No additional findings.   Subjective: No chief complaint on file. The patient is well-known to me.  We have been seeing him for well over a year now.  He has debilitating end-stage arthritis in both his knees that we have seen him for for even over 2 years.  He is worked on activity modification and weight loss.  He is worked on Freight forwarder.  He has known bone-on-bone or arthritis in both knees.  His left hurts worse than the right.  He has had multiple injections of steroids in both knees.  He has had  hyaluronic acid injections in both knees.  He has had arthroscopic interventions as well.  He is on Humira.  At this point his pain is daily and is detrimentally affect his mobility, his quality of life and his activities daily living to the point that knee replacement is warranted.  I agree with this as well given the failure of conservative treatment.  HPI  Review of Systems He currently denies any headache, chest pain, shortness of breath, fever, chills, nausea, vomiting  Objective: Vital Signs: Ht 5\' 6"  (1.676 m)   Wt 246 lb (111.6 kg)   BMI 39.71 kg/m   Physical Exam He is alert and orient x3 and in no acute distress Ortho Exam Examination of both knees show varus malalignment with significant medial joint line tenderness.  He has patellofemoral crepitation with both knees.  Both knees have limited mobility secondary to pain but he can flex and extend them and they are ligamentously stable. Specialty Comments:  No specialty comments available.  Imaging: No results found. X-rays reviewed again of both knees from January 2019 show severe end-stage arthritis with complete loss of medial joint space on both knees, para-articular osteophytes in all 3 compartments, sclerotic changes and varus malalignment.  PMFS History: Patient Active Problem List  Diagnosis Date Noted  . Unilateral primary osteoarthritis, left knee 09/08/2017  . Unilateral primary osteoarthritis, right knee 09/08/2017   Past Medical History:  Diagnosis Date  . Allergy   . Hypertension   . Osteoporosis   . Rheumatoid arthritis(714.0)     Family History  Problem Relation Age of Onset  . Hypertension Mother   . Hypertension Father     Past Surgical History:  Procedure Laterality Date  . [place], 2001  . KNEE SURGERY  2003   left   Social History   Occupational History  . Not on file  Tobacco Use  . Smoking status: Never Smoker  . Smokeless tobacco: Never Used  Substance and Sexual  Activity  . Alcohol use: No  . Drug use: No  . Sexual activity: Not on file

## 2018-12-17 DIAGNOSIS — Z7189 Other specified counseling: Secondary | ICD-10-CM | POA: Diagnosis not present

## 2018-12-17 DIAGNOSIS — M545 Low back pain: Secondary | ICD-10-CM | POA: Diagnosis not present

## 2019-02-07 DIAGNOSIS — M1712 Unilateral primary osteoarthritis, left knee: Secondary | ICD-10-CM | POA: Diagnosis not present

## 2019-02-07 DIAGNOSIS — M0579 Rheumatoid arthritis with rheumatoid factor of multiple sites without organ or systems involvement: Secondary | ICD-10-CM | POA: Diagnosis not present

## 2019-02-07 DIAGNOSIS — Z79899 Other long term (current) drug therapy: Secondary | ICD-10-CM | POA: Diagnosis not present

## 2019-02-07 DIAGNOSIS — M25562 Pain in left knee: Secondary | ICD-10-CM | POA: Diagnosis not present

## 2019-02-21 ENCOUNTER — Other Ambulatory Visit: Payer: Self-pay | Admitting: Physician Assistant

## 2019-02-22 ENCOUNTER — Other Ambulatory Visit: Payer: Self-pay

## 2019-03-01 ENCOUNTER — Other Ambulatory Visit (HOSPITAL_COMMUNITY)
Admission: RE | Admit: 2019-03-01 | Discharge: 2019-03-01 | Disposition: A | Discharge status: Home / Self Care | Payer: BC Managed Care – PPO | Source: Ambulatory Visit | Attending: Orthopaedic Surgery | Admitting: Orthopaedic Surgery

## 2019-03-01 DIAGNOSIS — Z01812 Encounter for preprocedural laboratory examination: Secondary | ICD-10-CM | POA: Insufficient documentation

## 2019-03-01 DIAGNOSIS — Z20828 Contact with and (suspected) exposure to other viral communicable diseases: Secondary | ICD-10-CM | POA: Insufficient documentation

## 2019-03-02 LAB — NOVEL CORONAVIRUS, NAA (HOSP ORDER, SEND-OUT TO REF LAB; TAT 18-24 HRS): SARS-CoV-2, NAA: NOT DETECTED

## 2019-03-02 NOTE — Patient Instructions (Addendum)
DUE TO COVID-19 ONLY ONE VISITOR IS ALLOWED TO COME WITH YOU AND STAY IN THE WAITING ROOM ONLY DURING PRE OP AND PROCEDURE DAY OF SURGERY.  THE 1 VISITOR MAY VISIT WITH YOU AFTER SURGERY IN YOUR PRIVATE ROOM DURING VISITING HOURS ONLY!   ONCE YOUR COVID TEST IS COMPLETED, PLEASE BEGIN THE QUARANTINE INSTRUCTIONS AS OUTLINED IN YOUR HANDOUT.                Anthony Boyd    Your procedure is scheduled on: Friday 03/04/19   Report to [facility] Main  Entrance   Report to admitting at  6:00 AM     Call this number if you have problems the morning of surgery [place], NO [place].   Do not eat food After Midnight.   YOU MAY HAVE CLEAR LIQUIDS FROM MIDNIGHT UNTIL 5:30 AM .  At 5:30 AM Please finish the prescribed Pre-Surgery  drink.   Nothing by mouth after you finish the  drink !   Take these medicines the morning of surgery with A SIP OF WATER: none  Ask your MD about your RA meds.                                 You may not have any metal on your body including piercings              Do not wear jewelry,, lotions, powders or  deodorant                       Men may shave face and neck.   Do not bring valuables to the hospital. [place].       Name and phone numurgery.  Leave suitcase in the car. After surgery it may be brought to your room.ber of your driver:  Special Instructions: N/A              Please read over the following fact sheets you were given: _____________________________________________________________________             [facility] - Preparing for Surgery Before surgery, you can play an important role.   Because skin is not sterile, your skin needs to be as free of germs as possible.   You can reduce the number of germs on your skin by washing with CHG (chlorahexidine gluconate) soap before surgery.    CHG is an antiseptic cleaner which kills germs and bonds with the skin to continue killing germs even after washing. Please DO NOT use if you have an allergy to CHG or antibacterial soaps.   If your skin becomes reddened/irritated stop using the CHG and inform your nurse when you arrive at Short Stay. You may shave your face/neck.  Please follow these instructions carefully:  1.  Shower with CHG Soap the night before surgery and the  morning of Surgery.  2.  If you choose to wash your hair, wash your hair first as usual with your  normal  shampoo.  3.  After you shampoo, rinse your hair and body thoroughly to remove the  shampoo.  4.  Use CHG as you would any other liquid soap.  You can apply chg directly  to the skin and wash                       Gently with a scrungie or clean washcloth.  5.  Apply the CHG Soap to your body ONLY FROM THE NECK DOWN.   Do not use on face/ open                           Wound or open sores. Avoid contact with eyes, ears mouth and genitals (private parts).                       Wash face,  Genitals (private parts) with your normal soap.             6.  Wash thoroughly, paying special attention to the area where your surgery  will be performed.  7.  Thoroughly rinse your body with warm water from the neck down.  8.  DO NOT shower/wash with your normal soap after using and rinsing off  the CHG Soap.                9.  Pat yourself dry with a clean towel.            10.  Wear clean pajamas.            11.  Place clean sheets on your bed the night of your first shower and do not  sleep with pets. Day of Surgery : Do not apply any lotions/deodorants the morning of surgery.  Please wear clean clothes to the hospital/surgery center.  FAILURE TO FOLLOW THESE INSTRUCTIONS MAY RESULT IN THE CANCELLATION OF YOUR SURGERY PATIENT SIGNATURE_________________________________  NURSE  SIGNATURE__________________________________  ________________________________________________________________________   Anthony Boyd  An incentive spirometer is a tool that can help keep your lungs clear and active. This tool measures how well you are filling your lungs with each breath. Taking long deep breaths may help reverse or decrease the chance of developing breathing (pulmonary) problems (especially infection) following:  A long period of time when you are unable to move or be active. BEFORE THE PROCEDURE   If the spirometer includes an indicator to show your best effort, your nurse or respiratory therapist will set it to a desired goal.  If possible, sit up straight or lean slightly forward. Try not to slouch.  Hold the incentive spirometer in an upright position. INSTRUCTIONS FOR USE  1. Sit on the edge of your bed if possible, or sit up as far as you can in bed or on a chair. 2. Hold the incentive spirometer in an upright position. 3. Breathe out normally. 4. Place the mouthpiece in your mouth and seal your lips tightly around it. 5. Breathe in slowly and as deeply as possible, raising the piston or the ball toward the top of the column. 6. Hold your breath for 3-5 seconds or for as long as possible. Allow the piston or ball to fall to the bottom of the column. 7. Remove the mouthpiece from your mouth and breathe out normally. 8. Rest for a few seconds and repeat Steps 1 through 7 at least 10 times every 1-2 hours when you are awake. Take your time and take a few normal breaths between deep breaths. 9. The spirometer may include an indicator to show  your best effort. Use the indicator as a goal to work toward during each repetition. 10. After each set of 10 deep breaths, practice coughing to be sure your lungs are clear. If you have an incision (the cut made at the time of surgery), support your incision when coughing by placing a pillow or rolled up towels firmly  against it. Once you are able to get out of bed, walk around indoors and cough well. You may stop using the incentive spirometer when instructed by your caregiver.  RISKS AND COMPLICATIONS  Take your time so you do not get dizzy or light-headed.  If you are in pain, you may need to take or ask for pain medication before doing incentive spirometry. It is harder to take a deep breath if you are having pain. AFTER USE  Rest and breathe slowly and easily.  It can be helpful to keep track of a log of your progress. Your caregiver can provide you with a simple table to help with this. If you are using the spirometer at home, follow these instructions: [place] IF:   You are having difficultly using the spirometer.  You have trouble using the spirometer as often as instructed.  Your pain medication is not giving enough relief while using the spirometer.  You develop fever of 100.5 F (38.1 C) or higher. SEEK IMMEDIATE MEDICAL CARE IF:   You cough up bloody sputum that had not been present before.  You develop fever of 102 F (38.9 C) or greater.  You develop worsening pain at or near the incision site. MAKE SURE YOU:   Understand these instructions.  Will watch your condition.  Will get help right away if you are not doing well or get worse. Document Released: 10/06/2006 Document Revised: 08/18/2011 Document Reviewed: 12/07/2006 [facility] Patient Information 2014 [place], [place].   ________________________________________________________________________

## 2019-03-03 ENCOUNTER — Encounter (HOSPITAL_COMMUNITY): Payer: Self-pay

## 2019-03-03 ENCOUNTER — Encounter (HOSPITAL_COMMUNITY)
Admission: RE | Admit: 2019-03-03 | Discharge: 2019-03-03 | Disposition: A | Discharge status: Home / Self Care | Payer: BC Managed Care – PPO | Source: Ambulatory Visit | Attending: Orthopaedic Surgery | Admitting: Orthopaedic Surgery

## 2019-03-03 ENCOUNTER — Other Ambulatory Visit: Payer: Self-pay

## 2019-03-03 DIAGNOSIS — M1712 Unilateral primary osteoarthritis, left knee: Secondary | ICD-10-CM | POA: Diagnosis not present

## 2019-03-03 DIAGNOSIS — Z471 Aftercare following joint replacement surgery: Secondary | ICD-10-CM | POA: Diagnosis not present

## 2019-03-03 DIAGNOSIS — G8918 Other acute postprocedural pain: Secondary | ICD-10-CM | POA: Diagnosis not present

## 2019-03-03 DIAGNOSIS — Z7982 Long term (current) use of aspirin: Secondary | ICD-10-CM | POA: Diagnosis not present

## 2019-03-03 DIAGNOSIS — I10 Essential (primary) hypertension: Secondary | ICD-10-CM | POA: Diagnosis not present

## 2019-03-03 DIAGNOSIS — M069 Rheumatoid arthritis, unspecified: Secondary | ICD-10-CM | POA: Diagnosis not present

## 2019-03-03 DIAGNOSIS — M25762 Osteophyte, left knee: Secondary | ICD-10-CM | POA: Diagnosis not present

## 2019-03-03 DIAGNOSIS — Z96652 Presence of left artificial knee joint: Secondary | ICD-10-CM | POA: Diagnosis not present

## 2019-03-03 DIAGNOSIS — M810 Age-related osteoporosis without current pathological fracture: Secondary | ICD-10-CM | POA: Diagnosis not present

## 2019-03-03 DIAGNOSIS — Z791 Long term (current) use of non-steroidal anti-inflammatories (NSAID): Secondary | ICD-10-CM | POA: Diagnosis not present

## 2019-03-03 DIAGNOSIS — M25562 Pain in left knee: Secondary | ICD-10-CM | POA: Diagnosis not present

## 2019-03-03 HISTORY — DX: Unspecified osteoarthritis, unspecified site: M19.90

## 2019-03-03 LAB — BASIC METABOLIC PANEL
Anion gap: 6 (ref 5–15)
BUN: 14 mg/dL (ref 6–20)
CO2: 27 mmol/L (ref 22–32)
Calcium: 9.2 mg/dL (ref 8.9–10.3)
Chloride: 105 mmol/L (ref 98–111)
Creatinine, Ser: 0.89 mg/dL (ref 0.61–1.24)
GFR calc Af Amer: >60 mL/min (ref >60)
GFR calc non Af Amer: >60 mL/min (ref >60)
Glucose, Bld: 120 mg/dL — ABNORMAL HIGH (ref 70–99)
Potassium: 4.4 mmol/L (ref 3.5–5.1)
Sodium: 138 mmol/L (ref 135–145)

## 2019-03-03 LAB — CBC
HCT: 47.8 % (ref 39.0–52.0)
Hemoglobin: 14.5 g/dL (ref 13.0–17.0)
MCH: 26.3 pg (ref 26.0–34.0)
MCHC: 30.3 g/dL (ref 30.0–36.0)
MCV: 86.6 fL (ref 80.0–100.0)
Platelets: 278 10*3/uL (ref 150–400)
RBC: 5.52 MIL/uL (ref 4.22–5.81)
RDW: 14.3 % (ref 11.5–15.5)
WBC: 6 10*3/uL (ref 4.0–10.5)
nRBC: 0 % (ref 0.0–0.2)

## 2019-03-03 LAB — SURGICAL PCR SCREEN
MRSA, PCR: NEGATIVE
Staphylococcus aureus: NEGATIVE

## 2019-03-03 NOTE — Anesthesia Preprocedure Evaluation (Addendum)
Anesthesia Evaluation  Patient identified by MRN, date of birth, ID band Patient awake    Reviewed: Allergy & Precautions, NPO status , Patient's Chart, lab work & pertinent test results  History of Anesthesia Complications Negative for: history of anesthetic complications  Airway Mallampati: II  TM Distance: >3 FB Neck ROM: Full    Dental  (+) Teeth Intact   Pulmonary neg pulmonary ROS,    Pulmonary exam normal        Cardiovascular hypertension, Pt. on medications Normal cardiovascular exam     Neuro/Psych negative neurological ROS  negative psych ROS   GI/Hepatic negative GI ROS, Neg liver ROS,   Endo/Other  Morbid obesity  Renal/GU negative Renal ROS  negative genitourinary   Musculoskeletal  (+) Arthritis , Rheumatoid disorders,    Abdominal   Peds  Hematology negative hematology ROS (+)   Anesthesia Other Findings On humira/MTX for RA  plts 278, no AC  Reproductive/Obstetrics                          Anesthesia Physical Anesthesia Plan  ASA: III  Anesthesia Plan: Spinal   Post-op Pain Management: GA combined w/ Regional for post-op pain   Induction:   PONV Risk Score and Plan: 1 and Propofol infusion, Treatment may vary due to age or medical condition, Ondansetron and TIVA  Airway Management Planned: Nasal Cannula and Simple Face Mask  Additional Equipment: None  Intra-op Plan:   Post-operative Plan:   Informed Consent: I have reviewed the patients History and Physical, chart, labs and discussed the procedure including the risks, benefits and alternatives for the proposed anesthesia with the patient or authorized representative who has indicated his/her understanding and acceptance.       Plan Discussed with:   Anesthesia Plan Comments:        Anesthesia Quick Evaluation

## 2019-03-03 NOTE — Progress Notes (Signed)
PCP - Dr. Roselle Locus Cardiologist - none  Chest x-ray - no EKG - 03/03/19 Stress Test - no ECHO - no Cardiac Cath - no  Sleep Study - no CPAP -   Fasting Blood Sugar - NA Checks Blood Sugar _____ times a day  Blood Thinner Instructions:NA Aspirin Instructions: Last Dose:  Anesthesia review:   Patient denies shortness of breath, fever, cough and chest pain at PAT appointment yes  Patient verbalized understanding of instructions that were given to them at the PAT appointment. Patient was also instructed that they will need to review over the PAT instructions again at home before surgery. yes

## 2019-03-04 ENCOUNTER — Inpatient Hospital Stay (HOSPITAL_COMMUNITY): Payer: BC Managed Care – PPO

## 2019-03-04 ENCOUNTER — Encounter (HOSPITAL_COMMUNITY): Payer: Self-pay | Admitting: Anesthesiology

## 2019-03-04 ENCOUNTER — Inpatient Hospital Stay (HOSPITAL_COMMUNITY): Payer: BC Managed Care – PPO | Admitting: Anesthesiology

## 2019-03-04 ENCOUNTER — Inpatient Hospital Stay (HOSPITAL_COMMUNITY): Payer: BC Managed Care – PPO | Admitting: Physician Assistant

## 2019-03-04 ENCOUNTER — Inpatient Hospital Stay (HOSPITAL_COMMUNITY)
Admission: RE | Admit: 2019-03-04 | Discharge: 2019-03-05 | DRG: 470 | Disposition: A | Discharge status: Home Health | Payer: BC Managed Care – PPO | Attending: Orthopaedic Surgery | Admitting: Orthopaedic Surgery

## 2019-03-04 ENCOUNTER — Encounter (HOSPITAL_COMMUNITY)
Admission: RE | Disposition: A | Discharge status: Home / Self Care | Payer: Self-pay | Source: Home / Self Care | Attending: Orthopaedic Surgery

## 2019-03-04 DIAGNOSIS — M25762 Osteophyte, left knee: Secondary | ICD-10-CM | POA: Diagnosis present

## 2019-03-04 DIAGNOSIS — I10 Essential (primary) hypertension: Secondary | ICD-10-CM | POA: Diagnosis present

## 2019-03-04 DIAGNOSIS — M069 Rheumatoid arthritis, unspecified: Secondary | ICD-10-CM | POA: Diagnosis present

## 2019-03-04 DIAGNOSIS — Z791 Long term (current) use of non-steroidal anti-inflammatories (NSAID): Secondary | ICD-10-CM

## 2019-03-04 DIAGNOSIS — Z96652 Presence of left artificial knee joint: Secondary | ICD-10-CM

## 2019-03-04 DIAGNOSIS — M810 Age-related osteoporosis without current pathological fracture: Secondary | ICD-10-CM | POA: Diagnosis present

## 2019-03-04 DIAGNOSIS — M1712 Unilateral primary osteoarthritis, left knee: Secondary | ICD-10-CM | POA: Diagnosis present

## 2019-03-04 DIAGNOSIS — M25562 Pain in left knee: Secondary | ICD-10-CM | POA: Diagnosis present

## 2019-03-04 DIAGNOSIS — Z7982 Long term (current) use of aspirin: Secondary | ICD-10-CM

## 2019-03-04 HISTORY — PX: TOTAL KNEE ARTHROPLASTY: SHX125

## 2019-03-04 SURGERY — ARTHROPLASTY, KNEE, TOTAL
Anesthesia: Spinal | Site: Knee | Laterality: Left

## 2019-03-04 MED ORDER — POVIDONE-IODINE 10 % EX SWAB
2.0000 "application " | Freq: Once | CUTANEOUS | Status: AC
  Administered 2019-03-04: 2 via TOPICAL

## 2019-03-04 MED ORDER — SODIUM CHLORIDE 0.9 % IV SOLN
INTRAVENOUS | Status: DC | PRN
  Administered 2019-03-04: 20 ug/min via INTRAVENOUS

## 2019-03-04 MED ORDER — DEXAMETHASONE SODIUM PHOSPHATE 10 MG/ML IJ SOLN
INTRAMUSCULAR | Status: DC | PRN
  Administered 2019-03-04: 10 mg via INTRAVENOUS

## 2019-03-04 MED ORDER — ACETAMINOPHEN 325 MG PO TABS
325.0000 mg | ORAL_TABLET | Freq: Four times a day (QID) | ORAL | Status: DC | PRN
  Administered 2019-03-05: 06:00:00 650 mg via ORAL
  Filled 2019-03-04: qty 2

## 2019-03-04 MED ORDER — METOCLOPRAMIDE HCL 5 MG PO TABS: 5.0000 mg | ORAL_TABLET | Freq: Three times a day (TID) | ORAL | Status: DC | PRN

## 2019-03-04 MED ORDER — FENTANYL CITRATE (PF) 100 MCG/2ML IJ SOLN
25.0000 ug | INTRAMUSCULAR | Status: DC | PRN
  Administered 2019-03-04: 25 ug via INTRAVENOUS

## 2019-03-04 MED ORDER — FENTANYL CITRATE (PF) 100 MCG/2ML IJ SOLN
INTRAMUSCULAR | Status: AC
  Administered 2019-03-04: 100 ug via INTRAVENOUS
  Filled 2019-03-04: qty 2

## 2019-03-04 MED ORDER — ROPIVACAINE HCL 5 MG/ML IJ SOLN
INTRAMUSCULAR | Status: DC | PRN
  Administered 2019-03-04: 30 mL via PERINEURAL

## 2019-03-04 MED ORDER — PROPOFOL 10 MG/ML IV BOLUS
INTRAVENOUS | Status: AC
  Filled 2019-03-04: qty 80

## 2019-03-04 MED ORDER — MIDAZOLAM HCL 5 MG/5ML IJ SOLN
INTRAMUSCULAR | Status: DC | PRN
  Administered 2019-03-04 (×2): 0.5 mg via INTRAVENOUS
  Administered 2019-03-04: 1 mg via INTRAVENOUS

## 2019-03-04 MED ORDER — TRANEXAMIC ACID-NACL 1000-0.7 MG/100ML-% IV SOLN
1000.0000 mg | INTRAVENOUS | Status: AC
  Administered 2019-03-04: 1000 mg via INTRAVENOUS

## 2019-03-04 MED ORDER — OXYCODONE HCL 5 MG/5ML PO SOLN: 5.0000 mg | Freq: Once | ORAL | Status: DC | PRN

## 2019-03-04 MED ORDER — CEFAZOLIN SODIUM-DEXTROSE 1-4 GM/50ML-% IV SOLN
1.0000 g | Freq: Four times a day (QID) | INTRAVENOUS | Status: AC
  Administered 2019-03-04 (×2): 1 g via INTRAVENOUS
  Filled 2019-03-04 (×3): qty 50

## 2019-03-04 MED ORDER — DEXAMETHASONE SODIUM PHOSPHATE 10 MG/ML IJ SOLN
INTRAMUSCULAR | Status: AC
  Filled 2019-03-04: qty 2

## 2019-03-04 MED ORDER — GLYCOPYRROLATE PF 0.2 MG/ML IJ SOSY
PREFILLED_SYRINGE | INTRAMUSCULAR | Status: AC
  Filled 2019-03-04: qty 1

## 2019-03-04 MED ORDER — PROPOFOL 500 MG/50ML IV EMUL
INTRAVENOUS | Status: DC | PRN
  Administered 2019-03-04: 85 ug/kg/min via INTRAVENOUS

## 2019-03-04 MED ORDER — LIDOCAINE 2% (20 MG/ML) 5 ML SYRINGE
INTRAMUSCULAR | Status: AC
  Filled 2019-03-04: qty 5

## 2019-03-04 MED ORDER — 0.9 % SODIUM CHLORIDE (POUR BTL) OPTIME
TOPICAL | Status: DC | PRN
  Administered 2019-03-04: 1000 mL

## 2019-03-04 MED ORDER — MIDAZOLAM HCL 2 MG/2ML IJ SOLN
1.0000 mg | INTRAMUSCULAR | Status: DC
  Administered 2019-03-04: 08:00:00 2 mg via INTRAVENOUS

## 2019-03-04 MED ORDER — DIPHENHYDRAMINE HCL 12.5 MG/5ML PO ELIX: 12.5000 mg | ORAL_SOLUTION | ORAL | Status: DC | PRN

## 2019-03-04 MED ORDER — MENTHOL 3 MG MT LOZG: 1.0000 | LOZENGE | OROMUCOSAL | Status: DC | PRN

## 2019-03-04 MED ORDER — ASPIRIN 81 MG PO CHEW
81.0000 mg | CHEWABLE_TABLET | Freq: Two times a day (BID) | ORAL | Status: DC
  Administered 2019-03-04 – 2019-03-05 (×2): 81 mg via ORAL
  Filled 2019-03-04 (×2): qty 1

## 2019-03-04 MED ORDER — PROPOFOL 10 MG/ML IV BOLUS
INTRAVENOUS | Status: DC | PRN
  Administered 2019-03-04: 10 mg via INTRAVENOUS

## 2019-03-04 MED ORDER — METHOCARBAMOL 500 MG IVPB - SIMPLE MED
500.0000 mg | Freq: Four times a day (QID) | INTRAVENOUS | Status: DC | PRN
  Administered 2019-03-04: 500 mg via INTRAVENOUS
  Filled 2019-03-04: qty 50

## 2019-03-04 MED ORDER — PROPOFOL 10 MG/ML IV BOLUS
INTRAVENOUS | Status: AC
  Filled 2019-03-04: qty 20

## 2019-03-04 MED ORDER — ONDANSETRON HCL 4 MG/2ML IJ SOLN
INTRAMUSCULAR | Status: DC | PRN
  Administered 2019-03-04: 4 mg via INTRAVENOUS

## 2019-03-04 MED ORDER — MIDAZOLAM HCL 2 MG/2ML IJ SOLN
INTRAMUSCULAR | Status: AC
  Filled 2019-03-04: qty 2

## 2019-03-04 MED ORDER — MIDAZOLAM HCL 2 MG/2ML IJ SOLN
INTRAMUSCULAR | Status: AC
  Administered 2019-03-04: 2 mg via INTRAVENOUS
  Filled 2019-03-04: qty 2

## 2019-03-04 MED ORDER — PANTOPRAZOLE SODIUM 40 MG PO TBEC
40.0000 mg | DELAYED_RELEASE_TABLET | Freq: Every day | ORAL | Status: DC
  Administered 2019-03-04 – 2019-03-05 (×2): 40 mg via ORAL
  Filled 2019-03-04 (×2): qty 1

## 2019-03-04 MED ORDER — METOCLOPRAMIDE HCL 5 MG/ML IJ SOLN: 5.0000 mg | Freq: Three times a day (TID) | INTRAMUSCULAR | Status: DC | PRN

## 2019-03-04 MED ORDER — TRANEXAMIC ACID-NACL 1000-0.7 MG/100ML-% IV SOLN
INTRAVENOUS | Status: AC
  Filled 2019-03-04: qty 100

## 2019-03-04 MED ORDER — ONDANSETRON HCL 4 MG/2ML IJ SOLN: 4.0000 mg | Freq: Four times a day (QID) | INTRAMUSCULAR | Status: DC | PRN

## 2019-03-04 MED ORDER — OXYCODONE HCL 5 MG PO TABS
ORAL_TABLET | ORAL | Status: AC
  Filled 2019-03-04: qty 1

## 2019-03-04 MED ORDER — SODIUM CHLORIDE 0.9 % IV SOLN
INTRAVENOUS | Status: DC
  Administered 2019-03-04: 13:00:00 via INTRAVENOUS

## 2019-03-04 MED ORDER — KETOROLAC TROMETHAMINE 15 MG/ML IJ SOLN
15.0000 mg | Freq: Four times a day (QID) | INTRAMUSCULAR | Status: AC
  Administered 2019-03-04 (×3): 15 mg via INTRAVENOUS
  Filled 2019-03-04 (×2): qty 1

## 2019-03-04 MED ORDER — STERILE WATER FOR IRRIGATION IR SOLN
Status: DC | PRN
  Administered 2019-03-04: 2000 mL

## 2019-03-04 MED ORDER — DEXMEDETOMIDINE HCL IN NACL 200 MCG/50ML IV SOLN
INTRAVENOUS | Status: AC
  Filled 2019-03-04: qty 50

## 2019-03-04 MED ORDER — ONDANSETRON HCL 4 MG/2ML IJ SOLN: 4.0000 mg | Freq: Once | INTRAMUSCULAR | Status: DC | PRN

## 2019-03-04 MED ORDER — KETOROLAC TROMETHAMINE 15 MG/ML IJ SOLN
INTRAMUSCULAR | Status: AC
  Filled 2019-03-04: qty 1

## 2019-03-04 MED ORDER — HYDROCHLOROTHIAZIDE 12.5 MG PO CAPS
12.5000 mg | ORAL_CAPSULE | Freq: Every day | ORAL | Status: DC
  Administered 2019-03-04 – 2019-03-05 (×2): 12.5 mg via ORAL
  Filled 2019-03-04 (×2): qty 1

## 2019-03-04 MED ORDER — DEXMEDETOMIDINE HCL IN NACL 200 MCG/50ML IV SOLN
INTRAVENOUS | Status: DC | PRN
  Administered 2019-03-04 (×2): 4 ug via INTRAVENOUS

## 2019-03-04 MED ORDER — ONDANSETRON HCL 4 MG/2ML IJ SOLN
INTRAMUSCULAR | Status: AC
  Filled 2019-03-04: qty 4

## 2019-03-04 MED ORDER — OXYCODONE HCL 5 MG PO TABS: 5.0000 mg | ORAL_TABLET | Freq: Once | ORAL | Status: DC | PRN

## 2019-03-04 MED ORDER — FOLIC ACID 1 MG PO TABS
1.0000 mg | ORAL_TABLET | Freq: Every day | ORAL | Status: DC
  Administered 2019-03-04 – 2019-03-05 (×2): 1 mg via ORAL
  Filled 2019-03-04 (×2): qty 1

## 2019-03-04 MED ORDER — BUPIVACAINE IN DEXTROSE 0.75-8.25 % IT SOLN
INTRATHECAL | Status: DC | PRN
  Administered 2019-03-04: 1.6 mL via INTRATHECAL

## 2019-03-04 MED ORDER — FENTANYL CITRATE (PF) 100 MCG/2ML IJ SOLN
INTRAMUSCULAR | Status: AC
  Filled 2019-03-04: qty 4

## 2019-03-04 MED ORDER — CEFAZOLIN SODIUM-DEXTROSE 2-4 GM/100ML-% IV SOLN
2.0000 g | INTRAVENOUS | Status: AC
  Administered 2019-03-04: 2 g via INTRAVENOUS

## 2019-03-04 MED ORDER — CHLORHEXIDINE GLUCONATE 4 % EX LIQD: 60.0000 mL | Freq: Once | CUTANEOUS | Status: DC

## 2019-03-04 MED ORDER — ZOLPIDEM TARTRATE 5 MG PO TABS: 5.0000 mg | ORAL_TABLET | Freq: Every evening | ORAL | Status: DC | PRN

## 2019-03-04 MED ORDER — DOCUSATE SODIUM 100 MG PO CAPS
100.0000 mg | ORAL_CAPSULE | Freq: Two times a day (BID) | ORAL | Status: DC
  Administered 2019-03-04 – 2019-03-05 (×2): 100 mg via ORAL
  Filled 2019-03-04 (×2): qty 1

## 2019-03-04 MED ORDER — FENTANYL CITRATE (PF) 100 MCG/2ML IJ SOLN
50.0000 ug | INTRAMUSCULAR | Status: DC
  Administered 2019-03-04: 08:00:00 100 ug via INTRAVENOUS

## 2019-03-04 MED ORDER — LISINOPRIL 20 MG PO TABS
20.0000 mg | ORAL_TABLET | Freq: Every day | ORAL | Status: DC
  Administered 2019-03-04 – 2019-03-05 (×2): 20 mg via ORAL
  Filled 2019-03-04 (×2): qty 1

## 2019-03-04 MED ORDER — LACTATED RINGERS IV SOLN
INTRAVENOUS | Status: DC
  Administered 2019-03-04 (×2): via INTRAVENOUS

## 2019-03-04 MED ORDER — HYDROMORPHONE HCL 1 MG/ML IJ SOLN
0.5000 mg | INTRAMUSCULAR | Status: DC | PRN
  Administered 2019-03-04: 0.5 mg via INTRAVENOUS
  Filled 2019-03-04: qty 1

## 2019-03-04 MED ORDER — METHOCARBAMOL 500 MG PO TABS
500.0000 mg | ORAL_TABLET | Freq: Four times a day (QID) | ORAL | Status: DC | PRN
  Administered 2019-03-04 – 2019-03-05 (×3): 500 mg via ORAL
  Filled 2019-03-04 (×3): qty 1

## 2019-03-04 MED ORDER — POLYETHYLENE GLYCOL 3350 17 G PO PACK: 17.0000 g | PACK | Freq: Every day | ORAL | Status: DC | PRN

## 2019-03-04 MED ORDER — ALUM & MAG HYDROXIDE-SIMETH 200-200-20 MG/5ML PO SUSP
30.0000 mL | ORAL | Status: DC | PRN
  Administered 2019-03-05: 02:00:00 30 mL via ORAL
  Filled 2019-03-04: qty 30

## 2019-03-04 MED ORDER — OXYCODONE HCL 5 MG PO TABS
10.0000 mg | ORAL_TABLET | ORAL | Status: DC | PRN
  Administered 2019-03-04 – 2019-03-05 (×3): 10 mg via ORAL

## 2019-03-04 MED ORDER — PHENOL 1.4 % MT LIQD: 1.0000 | OROMUCOSAL | Status: DC | PRN

## 2019-03-04 MED ORDER — PHENYLEPHRINE HCL (PRESSORS) 10 MG/ML IV SOLN
INTRAVENOUS | Status: AC
  Filled 2019-03-04: qty 1

## 2019-03-04 MED ORDER — BUPIVACAINE-EPINEPHRINE 0.5% -1:200000 IJ SOLN
INTRAMUSCULAR | Status: AC
  Filled 2019-03-04: qty 1

## 2019-03-04 MED ORDER — PHENYLEPHRINE HCL (PRESSORS) 10 MG/ML IV SOLN
INTRAVENOUS | Status: AC
  Filled 2019-03-04: qty 7

## 2019-03-04 MED ORDER — METHOCARBAMOL 500 MG IVPB - SIMPLE MED
INTRAVENOUS | Status: AC
  Filled 2019-03-04: qty 50

## 2019-03-04 MED ORDER — DEXAMETHASONE SODIUM PHOSPHATE 10 MG/ML IJ SOLN
INTRAMUSCULAR | Status: AC
  Filled 2019-03-04: qty 1

## 2019-03-04 MED ORDER — SODIUM CHLORIDE 0.9 % IR SOLN
Status: DC | PRN
  Administered 2019-03-04: 1000 mL

## 2019-03-04 MED ORDER — BUPIVACAINE HCL (PF) 0.25 % IJ SOLN
INTRAMUSCULAR | Status: AC
  Filled 2019-03-04: qty 30

## 2019-03-04 MED ORDER — LIDOCAINE 2% (20 MG/ML) 5 ML SYRINGE
INTRAMUSCULAR | Status: DC | PRN
  Administered 2019-03-04: 50 mg via INTRAVENOUS

## 2019-03-04 MED ORDER — CEFAZOLIN SODIUM-DEXTROSE 2-4 GM/100ML-% IV SOLN
INTRAVENOUS | Status: AC
  Filled 2019-03-04: qty 100

## 2019-03-04 MED ORDER — GLYCOPYRROLATE PF 0.2 MG/ML IJ SOSY
PREFILLED_SYRINGE | INTRAMUSCULAR | Status: DC | PRN
  Administered 2019-03-04 (×2): .1 mg via INTRAVENOUS

## 2019-03-04 MED ORDER — OXYCODONE HCL 5 MG PO TABS
5.0000 mg | ORAL_TABLET | ORAL | Status: DC | PRN
  Administered 2019-03-04: 5 mg via ORAL
  Administered 2019-03-05 (×2): 10 mg via ORAL
  Filled 2019-03-04 (×5): qty 2

## 2019-03-04 MED ORDER — LISINOPRIL-HYDROCHLOROTHIAZIDE 20-12.5 MG PO TABS: 1.0000 | ORAL_TABLET | Freq: Every day | ORAL | Status: DC

## 2019-03-04 MED ORDER — ONDANSETRON HCL 4 MG/2ML IJ SOLN
INTRAMUSCULAR | Status: AC
  Filled 2019-03-04: qty 2

## 2019-03-04 MED ORDER — ONDANSETRON HCL 4 MG PO TABS: 4.0000 mg | ORAL_TABLET | Freq: Four times a day (QID) | ORAL | Status: DC | PRN

## 2019-03-04 SURGICAL SUPPLY — 54 items
BAG ZIPLOCK 12X15 (MISCELLANEOUS) IMPLANT
BEARING TIBIAL INST SZ4 10 KNE (Miscellaneous) IMPLANT
BENZOIN TINCTURE PRP APPL 2/3 (GAUZE/BANDAGES/DRESSINGS) IMPLANT
BLADE SAG 18X100X1.27 (BLADE) ×1 IMPLANT
BLADE SURG SZ10 CARB STEEL (BLADE) ×4 IMPLANT
BNDG ELASTIC 6X10 VLCR STRL LF (GAUZE/BANDAGES/DRESSINGS) ×2 IMPLANT
BNDG ELASTIC 6X5.8 VLCR STR LF (GAUZE/BANDAGES/DRESSINGS) ×2 IMPLANT
BOWL SMART MIX CTS (DISPOSABLE) IMPLANT
COVER SURGICAL LIGHT HANDLE (MISCELLANEOUS) ×2 IMPLANT
COVER WAND RF STERILE (DRAPES) ×1 IMPLANT
CUFF TOURN SGL QUICK 34 (TOURNIQUET CUFF) ×1
CUFF TRNQT CYL 34X4.125X (TOURNIQUET CUFF) ×1 IMPLANT
DECANTER SPIKE VIAL GLASS SM (MISCELLANEOUS) IMPLANT
DRAPE U-SHAPE 47X51 STRL (DRAPES) ×2 IMPLANT
DRSG PAD ABDOMINAL 8X10 ST (GAUZE/BANDAGES/DRESSINGS) ×4 IMPLANT
DURAPREP 26ML APPLICATOR (WOUND CARE) ×2 IMPLANT
ELECT REM PT RETURN 15FT ADLT (MISCELLANEOUS) ×2 IMPLANT
FEMORAL POSTERIOR SZ5 LFT (Femur) IMPLANT
GAUZE SPONGE 4X4 12PLY STRL (GAUZE/BANDAGES/DRESSINGS) ×2 IMPLANT
GAUZE XEROFORM 1X8 LF (GAUZE/BANDAGES/DRESSINGS) ×2 IMPLANT
GLOVE BIO SURGEON STRL SZ7.5 (GLOVE) ×2 IMPLANT
GLOVE BIOGEL PI IND STRL 8 (GLOVE) ×2 IMPLANT
GLOVE BIOGEL PI INDICATOR 8 (GLOVE) ×2
GLOVE ECLIPSE 8.0 STRL XLNG CF (GLOVE) ×2 IMPLANT
GOWN STRL REUS W/TWL XL LVL3 (GOWN DISPOSABLE) ×4 IMPLANT
HANDPIECE INTERPULSE COAX TIP (DISPOSABLE) ×1
HOLDER FOLEY CATH W/STRAP (MISCELLANEOUS) ×1 IMPLANT
IMMOBILIZER KNEE 20 (SOFTGOODS) ×2
IMMOBILIZER KNEE 20 THIGH 36 (SOFTGOODS) ×1 IMPLANT
KIT TURNOVER KIT A (KITS) IMPLANT
KNEE PATELLA ASYMMETRIC 10X35 (Knees) ×1 IMPLANT
KNEE TIBIAL COMP TRI SZ4 (Knees) ×1 IMPLANT
NS IRRIG 1000ML POUR BTL (IV SOLUTION) ×2 IMPLANT
PACK TOTAL KNEE CUSTOM (KITS) ×2 IMPLANT
PADDING CAST COTTON 6X4 STRL (CAST SUPPLIES) ×4 IMPLANT
PIN FLUTED HEDLESS FIX 3.5X1/8 (PIN) ×1 IMPLANT
POSTERIOR FEMORAL SZ5 LFT (Femur) ×2 IMPLANT
PROTECTOR NERVE ULNAR (MISCELLANEOUS) ×2 IMPLANT
SET HNDPC FAN SPRY TIP SCT (DISPOSABLE) ×1 IMPLANT
SET PAD KNEE POSITIONER (MISCELLANEOUS) ×2 IMPLANT
STAPLER VISISTAT 35W (STAPLE) ×1 IMPLANT
STRIP CLOSURE SKIN 1/2X4 (GAUZE/BANDAGES/DRESSINGS) IMPLANT
SUT MNCRL AB 4-0 PS2 18 (SUTURE) ×1 IMPLANT
SUT VIC AB 0 CT1 27 (SUTURE) ×1
SUT VIC AB 0 CT1 27XBRD ANTBC (SUTURE) ×1 IMPLANT
SUT VIC AB 0 CT1 36 (SUTURE) ×1 IMPLANT
SUT VIC AB 1 CT1 36 (SUTURE) ×4 IMPLANT
SUT VIC AB 2-0 CT1 27 (SUTURE) ×2
SUT VIC AB 2-0 CT1 TAPERPNT 27 (SUTURE) ×2 IMPLANT
TIBIAL BEAR INST SZ4 10 KNEE (Miscellaneous) ×2 IMPLANT
TRAY FOLEY MTR SLVR 16FR STAT (SET/KITS/TRAYS/PACK) ×2 IMPLANT
WATER STERILE IRR 1000ML POUR (IV SOLUTION) ×2 IMPLANT
WRAP KNEE MAXI GEL POST OP (GAUZE/BANDAGES/DRESSINGS) ×2 IMPLANT
YANKAUER SUCT BULB TIP 10FT TU (MISCELLANEOUS) ×2 IMPLANT

## 2019-03-04 NOTE — Anesthesia Procedure Notes (Signed)

## 2019-03-04 NOTE — Transfer of Care (Signed)
Immediate Anesthesia Transfer of Care Note  Patient: Anthony Boyd  Procedure(s) Performed: Procedure(s): LEFT TOTAL KNEE ARTHROPLASTY (Left)  Patient Location: PACU  Anesthesia Type:Spinal  Level of Consciousness:  sedated, patient cooperative and responds to stimulation  Airway & Oxygen Therapy:Patient Spontanous Breathing and Patient connected to face mask oxgen  Post-op Assessment:  Report given to PACU RN and Post -op Vital signs reviewed and stable  Post vital signs:  Reviewed and stable  Last Vitals:  Vitals:   03/04/19 0759 03/04/19 1023  BP:  (!) 147/85  Pulse: 83 91  Resp: 20 18  Temp:  36.6 C  SpO2: 84% 66%    Complications: No apparent anesthesia complications

## 2019-03-04 NOTE — Evaluation (Signed)
Physical Therapy Evaluation Patient Details Name: Anthony Boyd MRN: [id number] DOB: 14-Aug-1965 Today's Date: 03/04/2019   History of Present Illness  Pt s/p L TKR and with hx RA and osteoporosis  Clinical Impression  Pt s/p L TKR and presents with decreased L LE strength/ROM and post op pain limiting functional mobility.  Pt should progress to dc home with family assist.    Follow Up Recommendations Follow surgeon's recommendation for DC plan and follow-up therapies    Equipment Recommendations  None recommended by PT    Recommendations for Other Services       Precautions / Restrictions Precautions Precautions: Knee;Fall Required Braces or Orthoses: Knee Immobilizer - Left Knee Immobilizer - Left: Discontinue once straight leg raise with < 10 degree lag Restrictions Weight Bearing Restrictions: No Other Position/Activity Restrictions: WBAT      Mobility  Bed Mobility Overal bed mobility: Needs Assistance Bed Mobility: Supine to Sit     Supine to sit: Min assist     General bed mobility comments: cues for sequence with min assist for L LE  Transfers Overall transfer level: Needs assistance Equipment used: Rolling walker (2 wheeled) Transfers: Sit to/from Stand Sit to Stand: Min assist         General transfer comment: cues for LE management and use of UEs to self assist  Ambulation/Gait Ambulation/Gait assistance: Min assist Gait Distance (Feet): 42 Feet Assistive device: Rolling walker (2 wheeled) Gait Pattern/deviations: Step-to pattern;Decreased step length - right;Decreased step length - left;Shuffle;Trunk flexed Gait velocity: decr   General Gait Details: cues for sequence, posture and position from [employer]            Wheelchair Mobility    Modified Rankin (Stroke Patients Only)       Balance Overall balance assessment: Mild deficits observed, not formally tested                                            Pertinent Vitals/Pain Pain Assessment: 0-10 Pain Score: 6  Pain Location: L knee Pain Descriptors / Indicators: Aching;Sore Pain Intervention(s): Limited activity within patient's tolerance;Monitored during session;Premedicated before session;Ice applied    Home Living Family/patient expects to be discharged to:: Private residence Living Arrangements: Spouse/significant other;Children Available Help at Discharge: Family Type of Home: House Home Access: Stairs to enter Entrance Stairs-Rails: Right;Left;Can reach both Technical brewer of Steps: 5 Home Layout: One level Home Equipment: Environmental consultant - 2 wheels      Prior Function Level of Independence: Independent         Comments: 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     Hand Dominance        Extremity/Trunk Assessment   Upper Extremity Assessment Upper Extremity Assessment: Overall WFL for tasks assessed    Lower Extremity Assessment Lower Extremity Assessment: LLE deficits/detail       Communication   Communication: No difficulties  Cognition Arousal/Alertness: Awake/alert Behavior During Therapy: WFL for tasks assessed/performed Overall Cognitive Status: Within Functional Limits for tasks assessed                                        General Comments      Exercises Total Joint Exercises Ankle Circles/Pumps: AROM;Both;15 reps;Supine   Assessment/Plan  PT Assessment Patient needs continued PT services  PT Problem List Decreased strength;Decreased range of motion;Decreased activity tolerance;Decreased mobility;Decreased knowledge of use of DME;Obesity;Pain       PT Treatment Interventions DME instruction;Gait training;Stair training;Functional mobility training;Therapeutic activities;Therapeutic exercise;Patient/family education    PT Goals (Current goals can be found in the Care Plan section)  Acute Rehab PT Goals Patient Stated Goal: Regain  IND PT Goal Formulation: With patient Time For Goal Achievement: 03/11/19 Potential to Achieve Goals: Good    Frequency 7X/week   Barriers to discharge        Co-evaluation               AM-PAC PT "6 Clicks" Mobility  Outcome Measure Help needed turning from your back to your side while in a flat bed without using bedrails?: A Little Help needed moving from lying on your back to sitting on the side of a flat bed without using bedrails?: A Little Help needed moving to and from a bed to a chair (including a wheelchair)?: A Little Help needed standing up from a chair using your arms (e.g., wheelchair or bedside chair)?: A Little Help needed to walk in hospital room?: A Little Help needed climbing 3-5 steps with a railing? : A Lot 6 Click Score: 17    End of Session Equipment Utilized During Treatment: Gait belt;Left knee immobilizer Activity Tolerance: Patient tolerated treatment well Patient left: in chair;with call bell/phone within reach;with chair alarm set;with family/visitor present Nurse Communication: Mobility status PT Visit Diagnosis: Difficulty in walking, not elsewhere classified (R26.2)    Time: 8127-5170 PT Time Calculation (min) (ACUTE ONLY): 20 min   Charges:   PT Evaluation $PT Eval Low Complexity: 1 Low          Anthony Boyd PT Acute Rehabilitation Services Pager [phone] Office [phone]   Anthony Boyd 03/04/2019, 4:04 PM

## 2019-03-04 NOTE — Anesthesia Postprocedure Evaluation (Signed)
Anesthesia Post Note  Patient: Anthony Boyd  Procedure(s) Performed: LEFT TOTAL KNEE ARTHROPLASTY (Left Knee)     Patient location during evaluation: PACU Anesthesia Type: Spinal Level of consciousness: oriented and awake and alert Pain management: pain level controlled Vital Signs Assessment: post-procedure vital signs reviewed and stable Respiratory status: spontaneous breathing, respiratory function stable and nonlabored ventilation Cardiovascular status: blood pressure returned to baseline and stable Postop Assessment: no headache, no backache, no apparent nausea or vomiting and spinal receding Anesthetic complications: no    Last Vitals:  Vitals:   03/04/19 1254 03/04/19 1408  BP: (!) 144/96 (!) 159/95  Pulse: 71 66  Resp: 14 18  Temp: 36.5 C 36.8 C  SpO2: 99% 100%    Last Pain:  Vitals:   03/04/19 1453  TempSrc:   PainSc: 6                  Lidia Collum

## 2019-03-04 NOTE — Op Note (Signed)
NAME: BRANSEN, FASSNACHT MEDICAL RECORD VQ:[id number] ACCOUNT [id number] DATE OF BIRTH:April 24, 1966 FACILITY: [facility] LOCATION: [facility] PHYSICIAN:Jolie Strohecker Aretha Parrot, MD  OPERATIVE REPORT  DATE OF PROCEDURE:  03/04/2019  PREOPERATIVE DIAGNOSIS:  Primary end-stage arthritis and degenerative joint disease, left knee.  POSTOPERATIVE DIAGNOSIS:  Primary end-stage arthritis and degenerative joint disease, left knee.  PROCEDURE:  Left total knee arthroplasty.  IMPLANTS:  Stryker Triathlon press-fit knee system with size 5 femur, size 4 tibial tray, a 10 mm, fixed-bearing polyethylene insert, size 35 press-fit patellar button.  SURGEON:  Vanita Panda. Magnus Ivan, MD  ASSISTANT:  Richardean Canal, PA-C  ANESTHESIA: 1.  Left lower extremity adductor canal block. 2.  General.  TOURNIQUET TIME:  Less than 1 hour.  ESTIMATED BLOOD LOSS:  100 mL.  ANTIBIOTICS:  Two grams IV Ancef.  COMPLICATIONS:  None.  INDICATIONS:  The patient is a 53 year old active individual with bilateral knee severe end-stage arthritis.  He had a left knee arthroscopy about 6 years ago.  His pain is daily.  It has detrimentally affected his activities of daily living, his quality  of life, and his mobility.  His x-rays show complete loss of joint space on the left side.  He also has severe arthritis on his right side.  At this point, he has tried and failed all forms of conservative treatment and does wish to proceed with total  knee arthroplasty due to the detrimental effect his knee pain has had on his quality of life and his mobility as well as activities of daily living.  He understands knee replacement surgery, the risk of acute blood loss anemia, nerve and vessel injury,  fracture, infection, DVT, and implant failure.  He understands our goals are to decrease pain, improve mobility, and overall improve quality of life.  DESCRIPTION OF PROCEDURE:  After informed consent was obtained and appropriate left knee was  marked, an adductor canal block was obtained in the holding room.  He was then brought to the operating room, sat up on the operating table.  Spinal anesthesia  was then obtained.  He was then laid in supine position.  A Foley catheter was placed, and a nonsterile tourniquet was placed around his upper left thigh.  His left thigh, knee, leg, ankle, foot were prepped and draped with DuraPrep and sterile drapes.   Time-out was called, and he was identified as correct patient, correct left knee.  We then made a midline incision over the patella and carried this proximally and distally.  We dissected down the knee joint and carried out a medial parapatellar  arthrotomy, finding a large joint effusion and significant large osteophytes and loose bodies around the knee.  With the knee in a flexed position, we removed remnants of ACL, PCL, medial, and lateral meniscus.  We found significant varus malalignment  and removed all the osteophytes and loose bodies as we could as well.  Using the extramedullary cutting guide for making our proximal tibia cut, we set this for a neutral slope, correcting for varus and valgus and taking 9 mm off the high side and made  this cut without difficulty, but I felt like we needed to take him a little bit more tibia, so we actually took 2 more millimeters.  We then went to the femur.  Using intramedullary guide for the femur, setting this for a left femur for our distal  femoral cut at 10 mm and distal femoral cut in 5 degrees externally rotated.  We made that cut without difficulty, and  I brought the knee back down to full extension.  With a 9 mm extension block, it was just slightly tight, but we felt like we did open  this up with a 4-in-1 cutting block.  We then went back to the femur and put our femoral sizing guide based off Whiteside's line and epicondylar axis.  For a left knee at 3 degrees, we chose a size 5 femur.  We put a 4-in-1 cutting block for a size 5  femur, made our  anterior and posterior cuts, followed by our chamfer cuts.  We then made our femoral box cut.  We then went back to the tibia.  We chose a size 4 tibial tray for coverage, and we again felt like we could perform press-fit implants due to  his young age and his hard bone with good bone quality.  We set our rotation of the tibial tray off of the tibial tubercle and the femur and made our keel cut off of this and our keel punch.  We then placed a trial 4 tibia, followed by the trial 5 femur.   We tried a 9 mm fixed-bearing insert, and we felt good with the 9 mm insert in terms of stability and range of motion.  We then made our patellar cut and drilled 3 holes for a left press-fit 35 patellar button.  We then removed all instrumentation from  the knee and irrigated the knee thoroughly with normal saline solution using pulsatile lavage.  We dried the knee real well.  Then with the knee in a flexed position, we placed our real Stryker tibial tray size 4.  We did perform the final finishing  block for the tibia prior to placing the size 4 insert.  Once the size 4 tibial tray was placed, we placed the real size 5 femur, and we went with a 10 mm fixed-bearing polyethylene insert and then we press-fit our patellar button.  I was pleased with  range of motion and stability after that.  With the tourniquet down, hemostasis was obtained with electrocautery.  We then closed the arthrotomy with interrupted #1 Vicryl suture, followed by closing the deep tissue with 0 Vicryl, 2-0 Vicryl was used to  close subcutaneous tissue, and staples to close the skin incision.  A Xeroform well-padded sterile dressing was applied.  He was taken to recovery room in stable condition.  All final counts were correct.  There were no complications noted.  Note Benita Stabile, PA-C, assisted during the entire case.  His assistance was crucial for facilitating all aspects of this case.  LN/NUANCE  D:03/04/2019 T:03/04/2019 JOB:[id number]

## 2019-03-04 NOTE — Progress Notes (Signed)
AssistedDr. Carolyn Witman with left, ultrasound guided, adductor canal block. Side rails up, monitors on throughout procedure. See vital signs in flow sheet. Tolerated Procedure well.  

## 2019-03-04 NOTE — Brief Op Note (Signed)
03/04/2019  9:50 AM  PATIENT:  Anthony Boyd  53 y.o. male  PRE-OPERATIVE DIAGNOSIS:  osteoarthritis left knee  POST-OPERATIVE DIAGNOSIS:  osteoarthritis left knee  PROCEDURE:  Procedure(s): LEFT TOTAL KNEE ARTHROPLASTY (Left)  SURGEON:  Surgeon(s) and Role:    Mcarthur Rossetti, MD - Primary  PHYSICIAN ASSISTANT: Benita Stabile, PA-C  ANESTHESIA:   regional and spinal  EBL:  100 mL   COUNTS:  YES  TOURNIQUET:   Total Tourniquet Time Documented: Thigh (Left) - 48 minutes Total: Thigh (Left) - 48 minutes   DICTATION: .Other Dictation: Dictation Number [id number]  PLAN OF CARE: Admit to inpatient   PATIENT DISPOSITION:  PACU - hemodynamically stable.   Delay start of Pharmacological VTE agent (>24hrs) due to surgical blood loss or risk of bleeding: no

## 2019-03-04 NOTE — Anesthesia Procedure Notes (Signed)
Anesthesia Regional Block: Adductor canal block   Pre-Anesthetic Checklist: ,, timeout performed, Correct Patient, Correct Site, Correct Laterality, Correct Procedure, Correct Position, site marked, Risks and benefits discussed,  Surgical consent,  Pre-op evaluation,  At surgeon's request and post-op pain management  Laterality: Left  Prep: chloraprep       Needles:  Injection technique: Single-shot  Needle Type: Echogenic Stimulator Needle     Needle Length: 10cm  Needle Gauge: 21     Additional Needles:   Procedures:,,,, ultrasound used (permanent image in chart),,,,  Narrative:  Start time: 03/04/2019 7:55 AM End time: 03/04/2019 8:01 AM Injection made incrementally with aspirations every 5 mL.  Performed by: Personally  Anesthesiologist: Lidia Collum, MD  Additional Notes: Monitors applied. Injection made in 5cc increments. No resistance to injection. Good needle visualization. Patient tolerated procedure well.

## 2019-03-04 NOTE — H&P (Signed)
TOTAL KNEE ADMISSION H&P  Patient is being admitted for left total knee arthroplasty.  Subjective:  Chief Complaint:left knee pain.  HPI: Anthony Boyd, 53 y.o. male, has a history of pain and functional disability in the left knee due to arthritis and has failed non-surgical conservative treatments for greater than 12 weeks to includeNSAID's and/or analgesics, corticosteriod injections, viscosupplementation injections, flexibility and strengthening excercises, supervised PT with diminished ADL's post treatment, weight reduction as appropriate and activity modification.  Onset of symptoms was gradual, starting 6 years ago with gradually worsening course since that time. The patient noted prior procedures on the knee to include  arthroscopy on the left knee(s).  Patient currently rates pain in the left knee(s) at 10 out of 10 with activity. Patient has night pain, worsening of pain with activity and weight bearing, pain that interferes with activities of daily living, pain with passive range of motion, crepitus and joint swelling.  Patient has evidence of subchondral sclerosis, periarticular osteophytes and joint space narrowing by imaging studies. There is no active infection.  Patient Active Problem List   Diagnosis Date Noted  . Unilateral primary osteoarthritis, left knee 09/08/2017  . Unilateral primary osteoarthritis, right knee 09/08/2017   Past Medical History:  Diagnosis Date  . Allergy   . 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 Hypertension   . Osteoporosis   . Rheumatoid arthritis(714.0)     Past Surgical History:  Procedure Laterality Date  . FOOT SURGERY  1998, 2001  . KNEE SURGERY  2003   left    Current Facility-Administered Medications  Medication Dose Route Frequency Provider Last Rate Last Dose  . ceFAZolin (ANCEF) 2-4 GM/100ML-% IVPB           . ceFAZolin (ANCEF) IVPB 2g/100 mL premix  2 g Intravenous On Call to OR Kirtland Bouchard, PA-C      . chlorhexidine  (HIBICLENS) 4 % liquid 4 application  60 mL Topical Once Richardean Canal W, PA-C      . fentaNYL (SUBLIMAZE) 100 MCG/2ML injection           . fentaNYL (SUBLIMAZE) injection 50-100 mcg  50-100 mcg Intravenous UD Lucretia Kern, MD      . lactated ringers infusion   Intravenous Continuous Lucretia Kern, MD 20 mL/hr at 03/04/19 [phone]    . midazolam (VERSED) 2 MG/2ML injection           . midazolam (VERSED) injection 1-2 mg  1-2 mg Intravenous UD Witman, Gae Bon, MD      . tranexamic acid (CYKLOKAPRON) 1000MG /149mL IVPB           . tranexamic acid (CYKLOKAPRON) IVPB 1,000 mg  1,000 mg Intravenous To OR 80m, PA-C       No Known Allergies  Social History   Tobacco Use  . Smoking status: Never Smoker  . Smokeless tobacco: Never Used  Substance Use Topics  . Alcohol use: No    Family History  Problem Relation Age of Onset  . Hypertension Mother   . Hypertension Father      Review of Systems  Musculoskeletal: Positive for joint pain.  All other systems reviewed and are negative.   Objective:  Physical Exam  Constitutional: He is oriented to person, place, and time. He appears well-developed and well-nourished.  HENT:  Head: Normocephalic and atraumatic.  Eyes: Pupils are equal, round, and reactive to light. EOM are normal.  Neck: Normal range of motion.  Cardiovascular: Normal rate.  Respiratory: Effort normal.  GI: Soft.  Musculoskeletal:     Left knee: He exhibits decreased range of motion, swelling, effusion, abnormal alignment, bony tenderness and abnormal meniscus. Tenderness found. Medial joint line and lateral joint line tenderness noted.  Neurological: He is alert and oriented to person, place, and time.  Skin: Skin is warm and dry.  Psychiatric: He has a normal mood and affect.    Vital signs in last 24 hours: Temp:  [98.3 F (36.8 C)-98.6 F (37 C)] 98.6 F (37 C) (09/25 0630) Pulse Rate:  [79-93] 93 (09/25 0630) Resp:  [17-20] 17 (09/25  0630) BP: (156)/(99) 156/99 (09/24 0946) SpO2:  [96 %-97 %] 96 % (09/25 0630) Weight:  [829 kg] 112 kg (09/25 0643)  Labs:   Estimated body mass index is 39.87 kg/m as calculated from the following:   Height as of this encounter: 5\' 6"  (1.676 m).   Weight as of this encounter: 112 kg.   Imaging Review Plain radiographs demonstrate severe degenerative joint disease of the left knee(s). The overall alignment ismild varus. The bone quality appears to be excellent for age and reported activity level.      Assessment/Plan:  End stage arthritis, left knee   The patient history, physical examination, clinical judgment of the provider and imaging studies are consistent with end stage degenerative joint disease of the left knee(s) and total knee arthroplasty is deemed medically necessary. The treatment options including medical management, injection therapy arthroscopy and arthroplasty were discussed at length. The risks and benefits of total knee arthroplasty were presented and reviewed. The risks due to aseptic loosening, infection, stiffness, patella tracking problems, thromboembolic complications and other imponderables were discussed. The patient acknowledged the explanation, agreed to proceed with the plan and consent was signed. Patient is being admitted for inpatient treatment for surgery, pain control, PT, OT, prophylactic antibiotics, VTE prophylaxis, progressive ambulation and ADL's and discharge planning. The patient is planning to be discharged home with home health services     Patient's anticipated LOS is less than 2 midnights, meeting these requirements: - Younger than 39 - Lives within 1 hour of care - Has a competent adult at home to recover with post-op recover - NO history of  - Chronic pain requiring opiods  - Diabetes  - Coronary Artery Disease  - Heart failure  - Heart attack  - Stroke  - DVT/VTE  - Cardiac arrhythmia  - Respiratory Failure/COPD  - Renal  failure  - Anemia  - Advanced Liver disease

## 2019-03-05 LAB — CBC
HCT: 40.2 % (ref 39.0–52.0)
Hemoglobin: 12.1 g/dL — ABNORMAL LOW (ref 13.0–17.0)
MCH: 26.3 pg (ref 26.0–34.0)
MCHC: 30.1 g/dL (ref 30.0–36.0)
MCV: 87.4 fL (ref 80.0–100.0)
Platelets: 267 10*3/uL (ref 150–400)
RBC: 4.6 MIL/uL (ref 4.22–5.81)
RDW: 14.2 % (ref 11.5–15.5)
WBC: 9.4 10*3/uL (ref 4.0–10.5)
nRBC: 0 % (ref 0.0–0.2)

## 2019-03-05 LAB — BASIC METABOLIC PANEL
Anion gap: 7 (ref 5–15)
BUN: 24 mg/dL — ABNORMAL HIGH (ref 6–20)
CO2: 24 mmol/L (ref 22–32)
Calcium: 8.3 mg/dL — ABNORMAL LOW (ref 8.9–10.3)
Chloride: 102 mmol/L (ref 98–111)
Creatinine, Ser: 1.52 mg/dL — ABNORMAL HIGH (ref 0.61–1.24)
GFR calc Af Amer: 60 mL/min — ABNORMAL LOW (ref >60)
GFR calc non Af Amer: 52 mL/min — ABNORMAL LOW (ref >60)
Glucose, Bld: 149 mg/dL — ABNORMAL HIGH (ref 70–99)
Potassium: 5 mmol/L (ref 3.5–5.1)
Sodium: 133 mmol/L — ABNORMAL LOW (ref 135–145)

## 2019-03-05 MED ORDER — OXYCODONE HCL 5 MG PO TABS: 5.0000 mg | ORAL_TABLET | ORAL | 0 refills | Status: DC | PRN

## 2019-03-05 MED ORDER — METHOCARBAMOL 500 MG PO TABS: 500.0000 mg | ORAL_TABLET | Freq: Three times a day (TID) | ORAL | 1 refills | Status: DC | PRN

## 2019-03-05 MED ORDER — DOCUSATE SODIUM 100 MG PO CAPS: 100.0000 mg | ORAL_CAPSULE | Freq: Two times a day (BID) | ORAL | 0 refills | Status: DC

## 2019-03-05 MED ORDER — ASPIRIN 81 MG PO CHEW: 81.0000 mg | CHEWABLE_TABLET | Freq: Two times a day (BID) | ORAL | 1 refills | Status: DC

## 2019-03-05 NOTE — Progress Notes (Signed)
Physical Therapy Treatment Patient Details Name: Anthony Boyd MRN: [id number] DOB: 12/18/1965 Today's Date: 03/05/2019    History of Present Illness Pt s/p L TKR and with hx RA and osteoporosis    PT Comments    Pt progressing well with mobility and eager for dc home this date.   Follow Up Recommendations  Follow surgeon's recommendation for DC plan and follow-up therapies     Equipment Recommendations  None recommended by PT    Recommendations for Other Services       Precautions / Restrictions Precautions Precautions: Knee;Fall Required Braces or Orthoses: Knee Immobilizer - Left Knee Immobilizer - Left: Discontinue once straight leg raise with < 10 degree lag(Pt performed IND SLR this am) Restrictions Weight Bearing Restrictions: No Other Position/Activity Restrictions: WBAT    Mobility  Bed Mobility Overal bed mobility: Needs Assistance Bed Mobility: Supine to Sit     Supine to sit: Min assist     General bed mobility comments: cues for sequence with min assist for L LE  Transfers Overall transfer level: Needs assistance Equipment used: Rolling walker (2 wheeled) Transfers: Sit to/from Stand Sit to Stand: Min assist         General transfer comment: cues for LE management and use of UEs to self assist  Ambulation/Gait Ambulation/Gait assistance: Min assist;Min guard Gait Distance (Feet): 140 Feet Assistive device: Rolling walker (2 wheeled) Gait Pattern/deviations: Step-to pattern;Decreased step length - right;Decreased step length - left;Shuffle;Trunk flexed Gait velocity: decr   General Gait Details: cues for sequence, posture and position from [employer]             Wheelchair Mobility    Modified Rankin (Stroke Patients Only)       Balance Overall balance assessment: Mild deficits observed, not formally tested                                          Cognition Arousal/Alertness: Awake/alert Behavior  During Therapy: WFL for tasks assessed/performed Overall Cognitive Status: Within Functional Limits for tasks assessed                                        Exercises Total Joint Exercises Ankle Circles/Pumps: AROM;Both;15 reps;Supine Quad Sets: AROM;Both;10 reps;Supine Heel Slides: AAROM;Left;15 reps;Supine Straight Leg Raises: AAROM;AROM;Left;15 reps;Supine Goniometric ROM: AAROM L knee -10 - 45    General Comments        Pertinent Vitals/Pain Pain Assessment: 0-10 Pain Score: 5  Pain Location: L knee Pain Descriptors / Indicators: Aching;Sore Pain Intervention(s): Limited activity within patient's tolerance;Monitored during session;Premedicated before session;Ice applied    Home Living                      Prior Function            PT Goals (current goals can now be found in the care plan section) Acute Rehab PT Goals Patient Stated Goal: Regain IND PT Goal Formulation: With patient Time For Goal Achievement: 03/11/19 Potential to Achieve Goals: Good Progress towards PT goals: Progressing toward goals    Frequency    7X/week      PT Plan Current plan remains appropriate    Co-evaluation              AM-PAC  PT "6 Clicks" Mobility   Outcome Measure  Help needed turning from your back to your side while in a flat bed without using bedrails?: A Little Help needed moving from lying on your back to sitting on the side of a flat bed without using bedrails?: A Little Help needed moving to and from a bed to a chair (including a wheelchair)?: A Little Help needed standing up from a chair using your arms (e.g., wheelchair or bedside chair)?: A Little Help needed to walk in hospital room?: A Little Help needed climbing 3-5 steps with a railing? : A Lot 6 Click Score: 17    End of Session Equipment Utilized During Treatment: Gait belt Activity Tolerance: Patient tolerated treatment well Patient left: in chair;with call bell/phone  within reach;with chair alarm set Nurse Communication: Mobility status PT Visit Diagnosis: Difficulty in walking, not elsewhere classified (R26.2)     Time: 4431-5400 PT Time Calculation (min) (ACUTE ONLY): 27 min  Charges:  $Gait Training: 8-22 mins $Therapeutic Exercise: 8-22 mins                     [place] Pager [phone] Office [phone]    Halim Surrette 03/05/2019, 12:36 PM

## 2019-03-05 NOTE — TOC Initial Note (Signed)
Transition of Care [facility]) - Initial/Assessment Note    Patient Details  Name: Anthony Boyd MRN: [id number] Date of Birth: 10/17/1965  Transition of Care (TOC) CM/SW Contact:    Joaquin Courts, RN Phone Number: 03/05/2019, 12:58 PM  Clinical Narrative:            CM spoke with patient at bedside. [facility] [place]. Reports has rolling walker at home, declines 3-in-1.        Expected Discharge Plan: [place] Barriers to Discharge: No Barriers Identified   Patient Goals and CMS Choice Patient states their goals for this hospitalization and ongoing recovery are:: to go home CMS Medicare.gov Compare Post Acute Care list provided to:: Patient Choice offered to / list presented to : Patient  Expected Discharge Plan and Services Expected Discharge Plan: [place]   Discharge Planning Services: CM Consult Post Acute Care Choice: [place] arrangements for the past 2 months: Single Family Home Expected Discharge Date: 03/05/19               DME Arranged: N/A DME Agency: NA       HH Arranged: PT HH [facility]: [facility] [employer]     Representative spoke with at [place]: pre arranged by MD office  Prior Living Arrangements/Services Living arrangements for the past 2 months: Single Family Home Lives with:: Spouse Patient language and need for interpreter reviewed:: Yes Do you feel safe going back to the place where you live?: Yes      Need for Family Participation in Patient Care: Yes (Comment) Care giver support system in place?: Yes (comment)   Criminal Activity/Legal Involvement Pertinent to Current Situation/Hospitalization: No - Comment as needed  Activities of Daily Living Home Assistive Devices/Equipment: Gilford Rile (specify type) ADL Screening (condition at time of admission) Patient's cognitive ability adequate to safely complete daily activities?: Yes Is the  patient deaf or have difficulty hearing?: No Does the patient have difficulty seeing, even when wearing glasses/contacts?: No Does the patient have difficulty concentrating, remembering, or making decisions?: No Patient able to express need for assistance with ADLs?: Yes Does the patient have difficulty dressing or bathing?: No Independently performs ADLs?: Yes (appropriate for developmental age) Does the patient have difficulty walking or climbing stairs?: No Weakness of Legs: None Weakness of Arms/Hands: None  Permission Sought/Granted                  Emotional Assessment Appearance:: Appears stated age Attitude/Demeanor/Rapport: Engaged Affect (typically observed): Accepting Orientation: : Oriented to Place, Oriented to  Time, Oriented to Situation, Oriented to Self   Psych Involvement: No (comment)  Admission diagnosis:  osteoarthritis left knee Patient Active Problem List   Diagnosis Date Noted  . Status post total left knee replacement 03/04/2019  . Unilateral primary osteoarthritis, left knee 09/08/2017  . Unilateral primary osteoarthritis, right knee 09/08/2017   PCP:  Merrilee Seashore, MD Pharmacy:   [id number] - [place], [place] [address] [place] [address] Phone: [phone] Fax: [phone]     Social Determinants of Health (SDOH) Interventions    Readmission Risk Interventions No flowsheet data found.

## 2019-03-05 NOTE — Progress Notes (Addendum)
     Subjective: 1 Day Post-Op Procedure(s) (LRB): LEFT TOTAL KNEE ARTHROPLASTY (Left) Awake, alert and oriented x 3. I'd like to go home today. Stood with PT last evening and has help at home.  Patient reports pain as moderate.    Objective:   VITALS:  Temp:  [97.7 F (36.5 C)-98.2 F (36.8 C)] 98 F (36.7 C) (09/26 0537) Pulse Rate:  [63-95] 63 (09/26 0537) Resp:  [11-21] 16 (09/26 0537) BP: (117-159)/(70-96) 117/79 (09/26 0537) SpO2:  [95 %-100 %] 100 % (09/26 0537)  Neurologically intact ABD soft Neurovascular intact Sensation intact distally Intact pulses distally Dorsiflexion/Plantar flexion intact Incision: dressing C/D/I and Ice packs intact, CPM is on table  Compartment soft   LABS Recent Labs    03/03/19 1000 03/05/19 0320  HGB 14.5 12.1*  WBC 6.0 9.4  PLT 278 267   Recent Labs    03/03/19 1000 03/05/19 0320  NA 138 133*  K 4.4 5.0  CL 105 102  CO2 27 24  BUN 14 24*  CREATININE 0.89 1.52*  GLUCOSE 120* 149*   No results for input(s): LABPT, INR in the last 72 hours.   Assessment/Plan: 1 Day Post-Op Procedure(s) (LRB): LEFT TOTAL KNEE ARTHROPLASTY (Left)  Advance diet Up with therapy D/C IV fluids  Dr. Ninfa Linden will see and decide if he will go home. Needs PT assessment prior to discharge but overall he is stable.  Paperwork completed and Rx sent to pharmacy pending Dr.Blackman's visit.   Basil Dess 03/05/2019, 8:37 AMPatient ID: Anthony Boyd, male   DOB: 1965/07/06, 53 y.o.   MRN: [id number]

## 2019-03-05 NOTE — Progress Notes (Signed)
Physical Therapy Treatment Patient Details Name: Anthony Boyd MRN: [id number] DOB: [birth year] Today's Date: 03/05/2019    History of Present Illness Pt s/p L TKR and with hx RA and osteoporosis    PT Comments    Pt progressing well with mobility.  Spouse present to review stairs and home therex program - written instruction provided.   Follow Up Recommendations  Follow surgeon's recommendation for DC plan and follow-up therapies     Equipment Recommendations  None recommended by PT    Recommendations for Other Services       Precautions / Restrictions Precautions Precautions: Knee;Fall Required Braces or Orthoses: Knee Immobilizer - Left Knee Immobilizer - Left: Discontinue once straight leg raise with < 10 degree lag Restrictions Weight Bearing Restrictions: No Other Position/Activity Restrictions: WBAT    Mobility  Bed Mobility Overal bed mobility: Needs Assistance Bed Mobility: Supine to Sit     Supine to sit: Min assist     General bed mobility comments: Pt up in chair and requests back to same  Transfers Overall transfer level: Needs assistance Equipment used: Rolling walker (2 wheeled) Transfers: Sit to/from Stand Sit to Stand: Supervision         General transfer comment: cues for LE management and use of UEs to self assist  Ambulation/Gait Ambulation/Gait assistance: Min guard;Supervision Gait Distance (Feet): 100 Feet Assistive device: Rolling walker (2 wheeled) Gait Pattern/deviations: Step-to pattern;Decreased step length - right;Decreased step length - left;Shuffle;Trunk flexed Gait velocity: decr   General Gait Details: cues for sequence, posture and position from RW   Stairs Stairs: Yes Stairs assistance: Min assist Stair Management: Two rails;Step to pattern;Forwards Number of Stairs: 5 General stair comments: min cues for sequence   Wheelchair Mobility    Modified Rankin (Stroke Patients Only)       Balance Overall  balance assessment: Mild deficits observed, not formally tested                                          Cognition Arousal/Alertness: Awake/alert Behavior During Therapy: WFL for tasks assessed/performed Overall Cognitive Status: Within Functional Limits for tasks assessed                                        Exercises Total Joint Exercises Ankle Circles/Pumps: AROM;Both;15 reps;Supine Quad Sets: AROM;Both;10 reps;Supine Heel Slides: AAROM;Left;15 reps;Supine Straight Leg Raises: AAROM;AROM;Left;15 reps;Supine Goniometric ROM: AAROM L knee -10 - 45    General Comments        Pertinent Vitals/Pain Pain Assessment: 0-10 Pain Score: 4  Pain Location: L knee Pain Descriptors / Indicators: Aching;Sore Pain Intervention(s): Limited activity within patient's tolerance;Monitored during session;Premedicated before session    Home Living                      Prior Function            PT Goals (current goals can now be found in the care plan section) Acute Rehab PT Goals Patient Stated Goal: Regain IND PT Goal Formulation: With patient Time For Goal Achievement: 03/11/19 Potential to Achieve Goals: Good Progress towards PT goals: Progressing toward goals    Frequency    7X/week      PT Plan Current plan remains appropriate    Co-evaluation  AM-PAC PT "6 Clicks" Mobility   Outcome Measure  Help needed turning from your back to your side while in a flat bed without using bedrails?: A Little Help needed moving from lying on your back to sitting on the side of a flat bed without using bedrails?: A Little Help needed moving to and from a bed to a chair (including a wheelchair)?: A Little Help needed standing up from a chair using your arms (e.g., wheelchair or bedside chair)?: A Little Help needed to walk in hospital room?: A Little Help needed climbing 3-5 steps with a railing? : A Little 6 Click Score:  18    End of Session Equipment Utilized During Treatment: Gait belt Activity Tolerance: Patient tolerated treatment well Patient left: in chair;with call bell/phone within reach;with family/visitor present Nurse Communication: Mobility status PT Visit Diagnosis: Difficulty in walking, not elsewhere classified (R26.2)     Time: 5427-0623 PT Time Calculation (min) (ACUTE ONLY): 37 min  Charges:  $Gait Training: 8-22 mins $Therapeutic Exercise: 8-22 mins                     [name] PT Acute Rehabilitation Services Pager [phone] Office [phone]    [name] 03/05/2019, 3:19 PM

## 2019-03-05 NOTE — Progress Notes (Signed)
    Home health agencies that serve [phone].        Home Health Agencies Search Results  Results List Table  Home Health Agency Information Quality of Patient Care Rating Patient Survey Summary Rating  [facility] [phone] 4 out of 5 stars 4 out of 5 stars  [facility] [phone] 3 out of 5 stars 4 out of 5 stars  [facility] [phone] 4  out of 5 stars 3 out of 5 stars  [facility] [phone] 4  out of 5 stars 4 out of 5 stars  [facility] [phone] 4 out of 5 stars 4 out of 5 stars  [facility] [phone] 4 out of 5 stars 4 out of 5 stars  [facility] [phone] 3  out of 5 stars 4 out of 5 stars  [facility] [phone] 2  out of 5 stars 3 out of 5 stars  [facility] [phone] 3 out of 5 stars 4 out of 5 stars  [facility] [phone] 4 out of 5 stars Not Available12  [facility] [phone] 3 out of 5 stars 4 out of 5 stars  [facility] [phone] Not Available5 Not Available12  [facility] [phone] 3  out of 5 stars 3 out of 5 stars  [facility] [phone] 5 out of 5 stars 4 out of 5 stars  [facility] [phone] 3  out of 5 stars 4 out of 5 stars  [facility] [phone] 3  out of 5 stars 3 out of 5 stars  [facility] [phone] 3  out of 5 stars Not Available11  [facility] [phone] 4  out of 5 stars 3 out of 5 stars   Home Health Footnotes  Footnote number Footnote as displayed on Home Health Compare  1 This agency provides services under a federal waiver program to non-traditional, chronic long term population.  2 This agency provides services to a special needs population.  3 Not Available.  4 The number of patient episodes  for this measure is too small to report.  5 This measure currently does not have data or provider has been certified/recertified for less than 6 months.  6 The national average for this measure is not provided because of state-to-state differences in data collection.  7 Medicare is not displaying rates for this measure for any home health agency, because of an issue with the data.  8 There were problems with the data and they are being corrected.  9 Zero, or very few, patients met the survey's rules for inclusion. The scores shown, if any, reflect a very small number of surveys and may not accurately tell how an agency is doing.  10 Survey results are based on less than 12 months of data.  11 Fewer than 70 patients completed the survey. Use the scores shown, if any, with caution as the number of surveys may be too low to accurately tell how an agency is doing.  12 No survey results are available for this period.  13 Data suppressed by CMS for one or more quarters.    

## 2019-03-05 NOTE — Progress Notes (Signed)
Patient ID: Anthony Boyd, male   DOB: 01/26/1966, 53 y.o.   MRN: [id number] Doing well.  Can be discharged to home this afternoon.

## 2019-03-05 NOTE — Discharge Summary (Signed)
Patient ID: Anthony Boyd MRN: [id number] DOB/AGE: [birth year] 53 y.o.  Admit date: 03/04/2019 Discharge date: 03/05/2019  Admission Diagnoses:  Principal Problem:   Unilateral primary osteoarthritis, left knee Active Problems:   Status post total left knee replacement   Discharge Diagnoses:  Same  Past Medical History:  Diagnosis Date  . Allergy   . 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 Hypertension   . Osteoporosis   . Rheumatoid arthritis(714.0)     Surgeries: Procedure(s): LEFT TOTAL KNEE ARTHROPLASTY on 03/04/2019   Consultants:   Discharged Condition: Improved  Hospital Course: Anthony Boyd is an 53 y.o. male who was admitted 03/04/2019 for operative treatment ofUnilateral primary osteoarthritis, left knee. Patient has severe unremitting pain that affects sleep, daily activities, and work/hobbies. After pre-op clearance the patient was taken to the operating room on 03/04/2019 and underwent  Procedure(s): LEFT TOTAL KNEE ARTHROPLASTY.    Patient was given perioperative antibiotics:  Anti-infectives (From admission, onward)   Start     Dose/Rate Route Frequency Ordered Stop   03/04/19 1300  ceFAZolin (ANCEF) IVPB 1 g/50 mL premix     1 g 100 mL/hr over 30 Minutes Intravenous Every 6 hours 03/04/19 1254 03/04/19 2208   03/04/19 0630  ceFAZolin (ANCEF) IVPB 2g/100 mL premix     2 g 200 mL/hr over 30 Minutes Intravenous On call to O.R. 03/04/19 9629 03/04/19 0828   03/04/19 0622  ceFAZolin (ANCEF) 2-4 GM/100ML-% IVPB    Note to Pharmacy: Randa Evens  : cabinet override      03/04/19 0622 03/04/19 0828       Patient was given sequential compression devices, early ambulation, and chemoprophylaxis to prevent DVT.  Patient benefited maximally from hospital stay and there were no complications.    Recent vital signs:  Patient Vitals for the past 24 hrs:  BP Temp Temp src Pulse Resp SpO2  03/05/19 0948 133/63 97.8 F (36.6 C) Oral 65 16 97 %  03/05/19  0537 117/79 98 F (36.7 C) Oral 63 16 100 %  03/05/19 0143 118/71 98 F (36.7 C) Oral 69 18 98 %  03/04/19 2159 132/84 98 F (36.7 C) Oral 70 18 98 %  03/04/19 1757 (!) 141/91 97.8 F (36.6 C) - 73 17 97 %  03/04/19 1408 (!) 159/95 98.2 F (36.8 C) Oral 66 18 100 %     Recent laboratory studies:  Recent Labs    03/03/19 1000 03/05/19 0320  WBC 6.0 9.4  HGB 14.5 12.1*  HCT 47.8 40.2  PLT 278 267  NA 138 133*  K 4.4 5.0  CL 105 102  CO2 27 24  BUN 14 24*  CREATININE 0.89 1.52*  GLUCOSE 120* 149*  CALCIUM 9.2 8.3*     Discharge Medications:   Allergies as of 03/05/2019   No Known Allergies     Medication List    STOP taking these medications   methotrexate 2.5 MG tablet Commonly known as: RHEUMATREX     TAKE these medications   aspirin 81 MG chewable tablet Chew 1 tablet (81 mg total) by mouth 2 (two) times daily.   celecoxib 200 MG capsule Commonly known as: CELEBREX Take 200 mg by mouth 2 (two) times daily.   docusate sodium 100 MG capsule Commonly known as: COLACE Take 1 capsule (100 mg total) by mouth 2 (two) times daily.   folic acid 1 MG tablet Commonly known as: FOLVITE Take 1 mg by mouth daily.  Humira Pen 40 MG/0.8ML Pnkt Generic drug: Adalimumab Inject 40 mg into the skin every 14 (fourteen) days.   lisinopril-hydrochlorothiazide 20-12.5 MG tablet Commonly known as: ZESTORETIC Take 1 tablet by mouth daily.   methocarbamol 500 MG tablet Commonly known as: ROBAXIN Take 1 tablet (500 mg total) by mouth every 8 (eight) hours as needed for muscle spasms.   oxyCODONE 5 MG immediate release tablet Commonly known as: Oxy IR/ROXICODONE Take 1-2 tablets (5-10 mg total) by mouth every 4 (four) hours as needed for moderate pain (pain score 4-6).            Durable Medical Equipment  (From admission, onward)         Start     Ordered   03/04/19 1255  DME Walker rolling  Once    Question:  Patient needs a walker to treat with the  following condition  Answer:  Status post total left knee replacement   03/04/19 1254   03/04/19 1255  DME 3 n 1  Once     03/04/19 1254          Diagnostic Studies: Dg Knee Left Port  Result Date: 03/04/2019 CLINICAL DATA:  Status post total left knee arthroplasty EXAM: PORTABLE LEFT KNEE - 1-2 VIEW COMPARISON:  Radiographs 06/17/2017 FINDINGS: The femoral, patellar and tibial components are well seated. No complicating features are identified. IMPRESSION: Well seated components of a total knee arthroplasty. Electronically Signed   By: Rudie Meyer M.D.   On: 03/04/2019 13:00    Disposition: Discharge disposition: 01-Home or Self Care       Discharge Instructions    Call MD / Call 911   Complete by: As directed    If you experience chest pain or shortness of breath, CALL 911 and be transported to the hospital emergency room.  If you develope a fever above 101 F, pus (white drainage) or increased drainage or redness at the wound, or calf pain, call your surgeon's office.   Constipation Prevention   Complete by: As directed    Drink plenty of fluids.  Prune juice may be helpful.  You may use a stool softener, such as Colace (over the counter) 100 mg twice a day.  Use MiraLax (over the counter) for constipation as needed.   Diet - low sodium heart healthy   Complete by: As directed    Discharge instructions   Complete by: As directed    INSTRUCTIONS AFTER JOINT REPLACEMENT   Remove items at home which could result in a fall. This includes throw rugs or furniture in walking pathways ICE to the affected joint every three hours while awake for 30 minutes at a time, for at least the first 3-5 days, and then as needed for pain and swelling.  Continue to use ice for pain and swelling. You may notice swelling that will progress down to the foot and ankle.  This is normal after surgery.  Elevate your leg when you are not up walking on it.   Continue to use the breathing machine you got in  the hospital (incentive spirometer) which will help keep your temperature down.  It is common for your temperature to cycle up and down following surgery, especially at night when you are not up moving around and exerting yourself.  The breathing machine keeps your lungs expanded and your temperature down.   DIET:  As you were doing prior to hospitalization, we recommend a well-balanced diet.  DRESSING / WOUND CARE / SHOWERING  Keep the surgical dressing until follow up.  The dressing is water proof, so you can shower without any extra covering.  IF THE DRESSING FALLS OFF or the wound gets wet inside, change the dressing with sterile gauze.  Please use good hand washing techniques before changing the dressing.  Do not use any lotions or creams on the incision until instructed by your surgeon.    ACTIVITY  Increase activity slowly as tolerated, but follow the weight bearing instructions below.   No driving for 6 weeks or until further direction given by your physician.  You cannot drive while taking narcotics.  No lifting or carrying greater than 10 lbs. until further directed by your surgeon. Avoid periods of inactivity such as sitting longer than an hour when not asleep. This helps prevent blood clots.  You may return to work once you are authorized by your doctor.     WEIGHT BEARING   Weight bearing as tolerated with assist device (walker, cane, etc) as directed, use it as long as suggested by your surgeon or therapist, typically at least 4-6 weeks.   EXERCISES  Results after joint replacement surgery are often greatly improved when you follow the exercise, range of motion and muscle strengthening exercises prescribed by your doctor. Safety measures are also important to protect the joint from further injury. Any time any of these exercises cause you to have increased pain or swelling, decrease what you are doing until you are comfortable again and then slowly increase them. If you have  problems or questions, call your caregiver or physical therapist for advice.   Rehabilitation is important following a joint replacement. After just a few days of immobilization, the muscles of the leg can become weakened and shrink (atrophy).  These exercises are designed to build up the tone and strength of the thigh and leg muscles and to improve motion. Often times heat used for twenty to thirty minutes before working out will loosen up your tissues and help with improving the range of motion but do not use heat for the first two weeks following surgery (sometimes heat can increase post-operative swelling).   These exercises can be done on a training (exercise) mat, on the floor, on a table or on a bed. Use whatever works the best and is most comfortable for you.    Use music or television while you are exercising so that the exercises are a pleasant break in your day. This will make your life better with the exercises acting as a break in your routine that you can look forward to.   Perform all exercises about fifteen times, three times per day or as directed.  You should exercise both the operative leg and the other leg as well.  Exercises include:   Quad Sets - Tighten up the muscle on the front of the thigh (Quad) and hold for 5-10 seconds.   Straight Leg Raises - With your knee straight (if you were given a brace, keep it on), lift the leg to 60 degrees, hold for 3 seconds, and slowly lower the leg.  Perform this exercise against resistance later as your leg gets stronger.  Leg Slides: Lying on your back, slowly slide your foot toward your buttocks, bending your knee up off the floor (only go as far as is comfortable). Then slowly slide your foot back down until your leg is flat on the floor again.  Angel Wings: Lying on your back spread your legs to the side as far  apart as you can without causing discomfort.  Hamstring Strength:  Lying on your back, push your heel against the floor with your  leg straight by tightening up the muscles of your buttocks.  Repeat, but this time bend your knee to a comfortable angle, and push your heel against the floor.  You may put a pillow under the heel to make it more comfortable if necessary.   A rehabilitation program following joint replacement surgery can speed recovery and prevent re-injury in the future due to weakened muscles. Contact your doctor or a physical therapist for more information on knee rehabilitation.    CONSTIPATION  Constipation is defined medically as fewer than three stools per week and severe constipation as less than one stool per week.  Even if you have a regular bowel pattern at home, your normal regimen is likely to be disrupted due to multiple reasons following surgery.  Combination of anesthesia, postoperative narcotics, change in appetite and fluid intake all can affect your bowels.   YOU MUST use at least one of the following options; they are listed in order of increasing strength to get the job done.  They are all available over the counter, and you may need to use some, POSSIBLY even all of these options:    Drink plenty of fluids (prune juice may be helpful) and high fiber foods Colace 100 mg by mouth twice a day  Senokot for constipation as directed and as needed Dulcolax (bisacodyl), take with full glass of water  Miralax (polyethylene glycol) once or twice a day as needed.  If you have tried all these things and are unable to have a bowel movement in the first 3-4 days after surgery call either your surgeon or your primary doctor.    If you experience loose stools or diarrhea, hold the medications until you stool forms back up.  If your symptoms do not get better within 1 week or if they get worse, check with your doctor.  If you experience "the worst abdominal pain ever" or develop nausea or vomiting, please contact the office immediately for further recommendations for treatment.   ITCHING:  If you experience  itching with your medications, try taking only a single pain pill, or even half a pain pill at a time.  You can also use Benadryl over the counter for itching or also to help with sleep.   TED HOSE STOCKINGS:  Use stockings on both legs until for at least 2 weeks or as directed by physician office. They may be removed at night for sleeping.  MEDICATIONS:  See your medication summary on the "After Visit Summary" that nursing will review with you.  You may have some home medications which will be placed on hold until you complete the course of blood thinner medication.  It is important for you to complete the blood thinner medication as prescribed.  PRECAUTIONS:  If you experience chest pain or shortness of breath - call 911 immediately for transfer to the hospital emergency department.   If you develop a fever greater that 101 F, purulent drainage from wound, increased redness or drainage from wound, foul odor from the wound/dressing, or calf pain - CONTACT YOUR SURGEON.  FOLLOW-UP APPOINTMENTS:  If you do not already have a post-op appointment, please call the office for an appointment to be seen by your surgeon.  Guidelines for how soon to be seen are listed in your "After Visit Summary", but are typically between 1-4 weeks after surgery.  OTHER INSTRUCTIONS:   Knee Replacement:  Do not place pillow under knee, focus on keeping the knee straight while resting. CPM instructions: 0-90 degrees, 2 hours in the morning, 2 hours in the afternoon, and 2 hours in the evening. Place foam block, curve side up under heel at all times except when in CPM or when walking.  DO NOT modify, tear, cut, or change the foam block in any way.  MAKE SURE YOU:  Understand these instructions.  Get help right away if you are not doing well or get worse.    Thank you for letting us be a part of your medical care team.  It is a privilege we respect greatly.  We hope these  instructions will help you stay on track for a fast and full recovery!   Driving restrictions   Complete by: As directed    No driving for 6 weeks   Increase activity slowly as tolerated   Complete by: As directed    Lifting restrictions   Complete by: As directed    No lifting for 6 weeks      Follow-up Information    Kathryne HitchBlackman, Hanz Winterhalter Y, MD Follow up in 2 week(s).   Specialty: Orthopedic Surgery Contact information: [address] [place] [address] [phone]        [facility] Follow up.   Specialty: Home Health Services Why: agency will provide home health physical therapy. agency will call you to schedule first visit. Contact information: [address] [place] [id number] [phone]            Signed: Kathryne HitchChristopher Y Naiyah Klostermann 03/05/2019, 1:13 PM

## 2019-03-05 NOTE — Discharge Instructions (Signed)

## 2019-03-07 ENCOUNTER — Encounter (HOSPITAL_COMMUNITY): Payer: Self-pay | Admitting: Orthopaedic Surgery

## 2019-03-07 DIAGNOSIS — M810 Age-related osteoporosis without current pathological fracture: Secondary | ICD-10-CM | POA: Diagnosis not present

## 2019-03-07 DIAGNOSIS — I10 Essential (primary) hypertension: Secondary | ICD-10-CM | POA: Diagnosis not present

## 2019-03-07 DIAGNOSIS — Z9181 History of falling: Secondary | ICD-10-CM | POA: Diagnosis not present

## 2019-03-07 DIAGNOSIS — M069 Rheumatoid arthritis, unspecified: Secondary | ICD-10-CM | POA: Diagnosis not present

## 2019-03-07 DIAGNOSIS — M1711 Unilateral primary osteoarthritis, right knee: Secondary | ICD-10-CM | POA: Diagnosis not present

## 2019-03-07 DIAGNOSIS — Z471 Aftercare following joint replacement surgery: Secondary | ICD-10-CM | POA: Diagnosis not present

## 2019-03-07 DIAGNOSIS — Z96652 Presence of left artificial knee joint: Secondary | ICD-10-CM | POA: Diagnosis not present

## 2019-03-11 ENCOUNTER — Other Ambulatory Visit: Payer: Self-pay | Admitting: Orthopaedic Surgery

## 2019-03-11 ENCOUNTER — Telehealth: Payer: Self-pay | Admitting: Orthopaedic Surgery

## 2019-03-11 MED ORDER — OXYCODONE HCL 5 MG PO TABS: 5.0000 mg | ORAL_TABLET | ORAL | 0 refills | Status: DC | PRN

## 2019-03-11 NOTE — Telephone Encounter (Signed)
I'll send some in. 

## 2019-03-11 NOTE — Telephone Encounter (Signed)
Please advise 

## 2019-03-11 NOTE — Telephone Encounter (Signed)
Patient called. He needs a refill on the oxycodone. Says he has 7 left. His call back number is [phone]

## 2019-03-15 DIAGNOSIS — Z96652 Presence of left artificial knee joint: Secondary | ICD-10-CM | POA: Diagnosis not present

## 2019-03-15 DIAGNOSIS — Z9181 History of falling: Secondary | ICD-10-CM | POA: Diagnosis not present

## 2019-03-15 DIAGNOSIS — M810 Age-related osteoporosis without current pathological fracture: Secondary | ICD-10-CM | POA: Diagnosis not present

## 2019-03-15 DIAGNOSIS — M1711 Unilateral primary osteoarthritis, right knee: Secondary | ICD-10-CM | POA: Diagnosis not present

## 2019-03-15 DIAGNOSIS — I10 Essential (primary) hypertension: Secondary | ICD-10-CM | POA: Diagnosis not present

## 2019-03-15 DIAGNOSIS — M069 Rheumatoid arthritis, unspecified: Secondary | ICD-10-CM | POA: Diagnosis not present

## 2019-03-15 DIAGNOSIS — Z471 Aftercare following joint replacement surgery: Secondary | ICD-10-CM | POA: Diagnosis not present

## 2019-03-16 DIAGNOSIS — M1711 Unilateral primary osteoarthritis, right knee: Secondary | ICD-10-CM | POA: Diagnosis not present

## 2019-03-16 DIAGNOSIS — Z471 Aftercare following joint replacement surgery: Secondary | ICD-10-CM | POA: Diagnosis not present

## 2019-03-16 DIAGNOSIS — Z96652 Presence of left artificial knee joint: Secondary | ICD-10-CM | POA: Diagnosis not present

## 2019-03-16 DIAGNOSIS — I10 Essential (primary) hypertension: Secondary | ICD-10-CM | POA: Diagnosis not present

## 2019-03-16 DIAGNOSIS — M069 Rheumatoid arthritis, unspecified: Secondary | ICD-10-CM | POA: Diagnosis not present

## 2019-03-16 DIAGNOSIS — M810 Age-related osteoporosis without current pathological fracture: Secondary | ICD-10-CM | POA: Diagnosis not present

## 2019-03-16 DIAGNOSIS — Z9181 History of falling: Secondary | ICD-10-CM | POA: Diagnosis not present

## 2019-03-18 DIAGNOSIS — M810 Age-related osteoporosis without current pathological fracture: Secondary | ICD-10-CM | POA: Diagnosis not present

## 2019-03-18 DIAGNOSIS — Z9181 History of falling: Secondary | ICD-10-CM | POA: Diagnosis not present

## 2019-03-18 DIAGNOSIS — I10 Essential (primary) hypertension: Secondary | ICD-10-CM | POA: Diagnosis not present

## 2019-03-18 DIAGNOSIS — M069 Rheumatoid arthritis, unspecified: Secondary | ICD-10-CM | POA: Diagnosis not present

## 2019-03-18 DIAGNOSIS — Z471 Aftercare following joint replacement surgery: Secondary | ICD-10-CM | POA: Diagnosis not present

## 2019-03-18 DIAGNOSIS — Z96652 Presence of left artificial knee joint: Secondary | ICD-10-CM | POA: Diagnosis not present

## 2019-03-18 DIAGNOSIS — M1711 Unilateral primary osteoarthritis, right knee: Secondary | ICD-10-CM | POA: Diagnosis not present

## 2019-03-21 ENCOUNTER — Other Ambulatory Visit: Payer: Self-pay

## 2019-03-21 ENCOUNTER — Ambulatory Visit (INDEPENDENT_AMBULATORY_CARE_PROVIDER_SITE_OTHER): Payer: BC Managed Care – PPO | Admitting: Orthopaedic Surgery

## 2019-03-21 ENCOUNTER — Encounter: Payer: Self-pay | Admitting: Orthopaedic Surgery

## 2019-03-21 DIAGNOSIS — Z96652 Presence of left artificial knee joint: Secondary | ICD-10-CM

## 2019-03-21 MED ORDER — METHOCARBAMOL 500 MG PO TABS: 500.0000 mg | ORAL_TABLET | Freq: Four times a day (QID) | ORAL | 1 refills | Status: DC | PRN

## 2019-03-21 MED ORDER — OXYCODONE HCL 5 MG PO TABS: 5.0000 mg | ORAL_TABLET | ORAL | 0 refills | Status: DC | PRN

## 2019-03-21 NOTE — Progress Notes (Signed)
The patient is 17 days status post a left total knee arthroplasty.  He is doing well overall.  He does need a refill on pain medications which is appropriate.  He has been on aspirin twice a day.  He is finished home health physical therapy and is ready for outpatient physical therapy.  He denies any calf pain.  On examination of his left operative knee he lacks full extension by about 3 degrees.  His flexion is to 90 degrees.  His calf is soft.  His incision is healed nicely and there is new Steri-Strips applied.  We will continue increase his activities as he tolerates.  We will have him stop his aspirin.  I am absolutely fine with refilling his oxycodone and Robaxin.  We will need to set him up for outpatient physical therapy to work on aggressive range of motion of his left knee as well as quad strengthening and other modalities that can help with his balance, coordination and gait training.  All question concerns were answered addressed.  We will see him back in 4 weeks for repeat clinical exam.

## 2019-03-24 ENCOUNTER — Encounter: Payer: Self-pay | Admitting: Physical Therapy

## 2019-03-24 ENCOUNTER — Ambulatory Visit: Payer: BC Managed Care – PPO | Attending: Orthopaedic Surgery | Admitting: Physical Therapy

## 2019-03-24 ENCOUNTER — Other Ambulatory Visit: Payer: Self-pay

## 2019-03-24 DIAGNOSIS — M6281 Muscle weakness (generalized): Secondary | ICD-10-CM | POA: Insufficient documentation

## 2019-03-24 DIAGNOSIS — R2689 Other abnormalities of gait and mobility: Secondary | ICD-10-CM

## 2019-03-24 DIAGNOSIS — G8929 Other chronic pain: Secondary | ICD-10-CM | POA: Diagnosis not present

## 2019-03-24 DIAGNOSIS — M25662 Stiffness of left knee, not elsewhere classified: Secondary | ICD-10-CM | POA: Diagnosis not present

## 2019-03-24 DIAGNOSIS — M25562 Pain in left knee: Secondary | ICD-10-CM | POA: Diagnosis not present

## 2019-03-24 NOTE — Patient Instructions (Signed)
Access Code: [email or web address]  URL: [email or web address]  Date: 03/24/2019  Prepared by: Hilda Blades   Exercises Supine Active Straight Leg Raise - 10 reps - 2 sets - 1-2x daily - 5x weekly Seated Knee Extension AROM - 10 reps - 2 sets - 1-2x daily - 5x weekly Standing Hip Abduction - 10 reps - 2 sets - 1-2x daily - 5x weekly Standing Hip Extension - 10 reps - 2 sets - 1-2x daily - 5x weekly Standing Heel Raises - 10 reps - 2 sets - 1-2x daily - 5x weekly Mini Squats at Table - 10 reps - 2 sets - 1-2x daily - 5x weekly Supine Heel Slide with Strap - 10 reps - 5-10 seconds hold - 2-3x daily - 7x weekly Standing Knee Flexion Stretch on Step - 10 reps - 5-10 seconds hold - 2-3x daily - 7x weekly Seated Knee Flexion Stretch - 10 reps - 5-10 seconds hold - 2-3x daily - 7x weekly Seated Hamstring Stretch - 10 reps - 5-10 seconds hold - 2-3x daily - 7x weekly

## 2019-03-24 NOTE — Therapy (Addendum)
[facility] [address] [place], [place], [address] Phone: [phone]   Fax:  [phone]  Physical Therapy Evaluation  Patient Details  Name: Anthony Boyd MRN: [id number] Date of Birth: [birth year] Referring Provider (PT): Kathryne Hitch, MD   Encounter Date: 03/24/2019  PT End of Session - 03/24/19 1042    Visit Number  1    Number of Visits  12    Date for PT Re-Evaluation  05/05/19    Authorization Type  BCBS    PT Start Time  [phone]    PT Stop Time  1017    PT Time Calculation (min)  41 min    Activity Tolerance  Patient tolerated treatment well    Behavior During Therapy  [name] for tasks assessed/performed       Past Medical History:  Diagnosis Date  . Allergy   . 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 Hypertension   . Osteoporosis   . Rheumatoid arthritis(714.0)     Past Surgical History:  Procedure Laterality Date  . FOOT SURGERY  1998, 2001  . KNEE SURGERY  2003   left  . TOTAL KNEE ARTHROPLASTY Left 03/04/2019   Procedure: LEFT TOTAL KNEE ARTHROPLASTY;  Surgeon: Kathryne Hitch, MD;  Location: [facility];  Service: Orthopedics;  Laterality: Left;    There were no vitals filed for this visit.   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 He had left TKA and he plans to have the right one done in the future. He is currently walking with the cane and he has been doing exercisres HHPT gave him. He reports that he has trouble getting comfortable to go to sleep. During the day he tries to keep moving and he does pretty well. Patient is not retired, but he is hoping to find a position where he doesn't put too much stress on his knee. He notes that he is stiff and painful in the morning so it takes him a while to get moving.    Pertinent History  RA,     Limitations  Walking    How long can you sit comfortably?  1-2 hours    How long can you stand comfortably?  20 minutes (with cane)    How long can you walk comfortably?  A few minutes around his house    Patient Stated Goals  Patient wants to get his knee strong so he can move comfortably and safely    Currently in Pain?  Yes    Pain Score  4     Pain Location  Knee    Pain Orientation  Left    Pain Descriptors / Indicators  Aching;Dull    Pain Type  Chronic pain;Surgical pain    Pain Radiating Towards  N/A    Pain Frequency  Constant    Aggravating Factors   Twisting or "tweaking" the knee    Pain Relieving Factors  Ice, medication    Effect of Pain on Daily Activities  Patient moves more slowly and cautiously, he hasn't done much in community since surgery    Multiple Pain Sites  No         [facility] PT Assessment - 03/24/19 0001      Assessment   Medical Diagnosis  s/p Left TKA,  right knee pain, right knee stiffness, right leg weakness    Referring Provider (PT)  Kathryne HitchBlackman, Christopher Y, MD    Onset Date/Surgical Date  03/04/19    Next MD Visit  04/21/2019    Prior Therapy  None      Precautions   Precautions  None      Restrictions   Weight Bearing Restrictions  No      Balance Screen   Has the patient fallen in the past 6 months  No    Has the patient had a decrease in activity level because of a fear of falling?   No    Is the patient reluctant to leave their home because of a fear of falling?   No      Home Environment   Living Environment  Private residence    Living Arrangements  Spouse/significant other;Children    Home Access  Stairs to enter    Entrance Stairs-Number of Steps  5    Entrance Stairs-Rails  Can reach both    Home Layout  Two level    Alternate Level Stairs-Number of Steps  15-18    Alternate Level Stairs-Rails  Can reach both      Prior Function   Level of Independence  Independent      Cognition   Overall Cognitive Status  Within  Functional Limits for tasks assessed      Observation/Other Assessments   Observations  Steri-strips applied    Skin Integrity  Healing scar    Focus on Therapeutic Outcomes (FOTO)   47% limitation      Observation/Other Assessments-Edema    Edema  --   Minimal     Sensation   Light Touch  Appears Intact   some change in sensation around the incision     Posture/Postural Control   Posture/Postural Control  No significant limitations      ROM / Strength   AROM / PROM / Strength  AROM;PROM;Strength      AROM   AROM Assessment Site  Knee    Right/Left Knee  Right;Left    Right Knee Extension  0    Right Knee Flexion  100    Left Knee Extension  5   lacking from neutral   Left Knee Flexion  95      PROM   PROM Assessment Site  Knee    Right/Left Knee  Left    Right Knee Extension  --    Right Knee Flexion  --    Left Knee Extension  3   lacking from neutral   Left Knee Flexion  98      Strength   Strength Assessment Site  Knee    Right/Left Knee  Right;Left    Right Knee Flexion  5/5    Right Knee Extension  5/5    Left Knee Flexion  4/5    Left Knee Extension  4/5      Palpation   Patella mobility  Mildly hypomobile superior/inferior    Palpation comment  Mildly tender on medial and lateral aspect of knee      Transfers   Transfers  Independent with all Transfers      Ambulation/Gait   Ambulation/Gait  Yes    Ambulation/Gait Assistance  6: Modified independent (Device/Increase time)    Assistive device  Straight cane    Gait Comments  Slow gait speed, antalgic on left with decreased stance time, decreased knee extension with heel strike  Balance   Balance Assessed  Yes   Unable to perform single leg stance on left leg                Objective measurements completed on examination: See above findings.      [facility] Adult PT Treatment/Exercise - 03/24/19 0001      Exercises   Exercises  Knee/Hip      Knee/Hip Exercises: Stretches    Passive Hamstring Stretch  30 seconds;Left   seated     Knee/Hip Exercises: Standing   Heel Raises  Both;1 set;10 reps    Hip Abduction  AROM;1 set;Left;10 reps    Hip Extension  AROM;Left;1 set;10 reps    Functional Squat  1 set;10 reps    Functional Squat Limitations  use of counter for support, decreased depth    Other Standing Knee Exercises  Standing knee flexion stretch foot on step x10      Knee/Hip Exercises: Seated   Long Arc Quad  AROM;Left;1 set;10 reps    Heel Slides  10 reps    Other Seated Knee/Hip Exercises  Seated knee flexion forward scoot stretch x10      Knee/Hip Exercises: Supine   Heel Slides  PROM;Left;10 reps;1 set    Straight Leg Raises  AROM;Left;1 set;10 reps             PT Education - 03/24/19 1021    Person(s) Educated  Patient    Methods  Explanation;Demonstration;Verbal cues;Handout    Comprehension  Verbalized understanding;Need further instruction       PT Short Term Goals - 03/24/19 1055      PT SHORT TERM GOAL #1   Title  Patient will be independent and consistent with home exercise program in order to maintain progress in PT.    Baseline  Instructed on exercises on eval    Time  6    Period  Weeks    Status  New    Target Date  05/05/19      PT SHORT TERM GOAL #2   Title  Patient will be able to walk community level distances without use of assistive device.    Baseline  Use of SPC    Time  6    Period  Weeks    Status  New    Target Date  05/05/19      PT SHORT TERM GOAL #3   Title  Patient will exhibit left knee range of motion WFL to allow for normalized gait form.    Baseline  Limited knee flexion and extension    Time  6    Period  Weeks    Status  New    Target Date  05/05/19      PT SHORT TERM GOAL #4   Title  Patient will exhibit left knee strength of 5/5 to allow for safe return to work and ability to play with children with no pain or limitation.    Baseline  Strength 4/5 left knee    Time  6    Period   Weeks    Status  New    Target Date  05/05/19      PT SHORT TERM GOAL #5   Title  Patient will display improvement in functional level to < or = 31% disability FOTO.    Baseline  47% disability    Time  6    Period  Weeks    Status  New    Target Date  05/05/19                Plan - 03/24/19 1044    Clinical Impression Statement  Patient presents to physical therapy s/p left TKA on 03/04/2019. He is doing well following surgery, but displays decreased left knee range of motion and strength deficits resulting in gait impairements with use of SPC and limitations in functional mobility. He would benefit from continued skilled physical therapy to reduce his pain and imporve his range of motion and strength to maximize his functional ability and allow for safe return to work with no pain or limitation.    Personal Factors and Comorbidities  Profession;Fitness;Comorbidity 2    Comorbidities  BMI, HTN    Examination-Activity Limitations  Bend;Carry;Lift;Squat;Stairs;Stand;Sit;Sleep;Transfers;Locomotion Level    Examination-Participation Restrictions  Yard Work;Community Activity    Stability/Clinical Decision Making  Evolving/Moderate complexity    Clinical Decision Making  Moderate    Rehab Potential  Good    PT Frequency  2x / week    PT Duration  6 weeks    PT Treatment/Interventions  ADLs/Self Care Home Management;Cryotherapy;Electrical Stimulation;Iontophoresis 4mg /ml Dexamethasone;Moist Heat;Gait training;Stair training;Functional mobility training;Therapeutic activities;Therapeutic exercise;Balance training;Neuromuscular re-education;Patient/family education;Manual techniques;Scar mobilization;Passive range of motion;Dry needling;Taping;Joint Manipulations    PT Next Visit Plan  Improve knee flexion and extension range of motion through joint mob and stretching, progress strength, gait training    PT Home Exercise Plan  Stretching: supine knee flexion heel slides with sheet, seated  knee flexion scoot, standing knee flexion on step, seated hamstring stretch with pressure over knee; Strengthening: SLR, LAQ, standing hip abduction and extension, mini-squat with counter support, heel raises    Consulted and Agree with Plan of Care  Patient       Patient will benefit from skilled therapeutic intervention in order to improve the following deficits and impairments:  Abnormal gait, Decreased balance, Decreased endurance, Decreased mobility, Difficulty walking, Hypomobility, Improper body mechanics, Decreased range of motion, Decreased activity tolerance, Decreased strength, Pain  Visit Diagnosis: Chronic pain of left knee - Plan: PT plan of care cert/re-cert  Stiffness of left knee, not elsewhere classified - Plan: PT plan of care cert/re-cert  Muscle weakness (generalized) - Plan: PT plan of care cert/re-cert  Other abnormalities of gait and mobility - Plan: PT plan of care cert/re-cert     Problem List Patient Active Problem List   Diagnosis Date Noted  . Status post total left knee replacement 03/04/2019  . Unilateral primary osteoarthritis, left knee 09/08/2017  . Unilateral primary osteoarthritis, right knee 09/08/2017    Anthony Boyd 03/24/2019, 12:24 PM  [facility] [address] [place], [place], [address] Phone: [phone]   Fax:  [phone]  Name: Anthony Boyd MRN: [id number] Date of Birth: 1965/10/25

## 2019-03-27 ENCOUNTER — Other Ambulatory Visit (INDEPENDENT_AMBULATORY_CARE_PROVIDER_SITE_OTHER): Payer: Self-pay | Admitting: Specialist

## 2019-03-28 ENCOUNTER — Telehealth: Payer: Self-pay | Admitting: Orthopaedic Surgery

## 2019-03-28 ENCOUNTER — Other Ambulatory Visit: Payer: Self-pay | Admitting: Orthopaedic Surgery

## 2019-03-28 MED ORDER — HYDROCODONE-ACETAMINOPHEN 5-325 MG PO TABS: 1.0000 | ORAL_TABLET | Freq: Four times a day (QID) | ORAL | 0 refills | Status: DC | PRN

## 2019-03-28 NOTE — Telephone Encounter (Signed)
9/25 left total knee  Continue ASA?

## 2019-03-28 NOTE — Telephone Encounter (Signed)
Patient called. He would like some hydrocodone called in. Says he has 4 left. His call back number is [phone]

## 2019-03-28 NOTE — Telephone Encounter (Signed)
Dr. Blackman patient 

## 2019-03-28 NOTE — Telephone Encounter (Signed)
Please advise 

## 2019-03-30 ENCOUNTER — Other Ambulatory Visit: Payer: Self-pay

## 2019-03-30 ENCOUNTER — Encounter: Payer: Self-pay | Admitting: Physical Therapy

## 2019-03-30 ENCOUNTER — Ambulatory Visit: Payer: BC Managed Care – PPO | Admitting: Physical Therapy

## 2019-03-30 DIAGNOSIS — R2689 Other abnormalities of gait and mobility: Secondary | ICD-10-CM | POA: Diagnosis not present

## 2019-03-30 DIAGNOSIS — G8929 Other chronic pain: Secondary | ICD-10-CM

## 2019-03-30 DIAGNOSIS — M25562 Pain in left knee: Secondary | ICD-10-CM | POA: Diagnosis not present

## 2019-03-30 DIAGNOSIS — M25662 Stiffness of left knee, not elsewhere classified: Secondary | ICD-10-CM | POA: Diagnosis not present

## 2019-03-30 DIAGNOSIS — M6281 Muscle weakness (generalized): Secondary | ICD-10-CM

## 2019-03-30 NOTE — Patient Instructions (Signed)
Access Code: [email or web address]  URL: [email or web address]  Date: 03/30/2019  Prepared by: Hilda Blades   Exercises Prone Quadriceps Stretch with Strap - 3 reps - 30 seconds hold Standing Gastroc Stretch at Counter - 3 reps - 30 seconds hold

## 2019-03-30 NOTE — Therapy (Signed)
[facility] [address] [place], [place], [address] Phone: [phone]   Fax:  [phone]  Physical Therapy Treatment  Patient Details  Name: Anthony Boyd MRN: [id number] Date of Birth: 05-16-1966 Referring Provider (PT): Kathryne Hitch, MD   Encounter Date: 03/30/2019  PT End of Session - 03/30/19 1145    Visit Number  2    Number of Visits  12    Date for PT Re-Evaluation  05/05/19    Authorization Type  BCBS    PT Start Time  1135    PT Stop Time  1215    PT Time Calculation (min)  40 min    Activity Tolerance  Patient tolerated treatment well    Behavior During Therapy  [facility] for tasks assessed/performed       Past Medical History:  Diagnosis Date  . Allergy   . 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 Hypertension   . Osteoporosis   . Rheumatoid arthritis(714.0)     Past Surgical History:  Procedure Laterality Date  . FOOT SURGERY  1998, 2001  . KNEE SURGERY  2003   left  . TOTAL KNEE ARTHROPLASTY Left 03/04/2019   Procedure: LEFT TOTAL KNEE ARTHROPLASTY;  Surgeon: Kathryne Hitch, MD;  Location: [facility];  Service: Orthopedics;  Laterality: Left;    There were no vitals filed for this visit.  Subjective Assessment - 03/30/19 1142    Subjective  Patient reports he is doing well. His knee has been improving and his consistent with his exercises. He does not onset of hand swelling, he believes is due to being off of some of his medications for >1 month.    Currently in Pain?  No/denies    Pain Location  Knee    Pain Orientation  Left    Pain Descriptors / Indicators  Tightness    Pain Frequency  Intermittent         [facility] PT Assessment - 03/30/19 0001      ROM / Strength   AROM / PROM / Strength  AROM      AROM   AROM Assessment Site  Knee    Right/Left Knee  Right;Left    Left Knee Extension  4    Left Knee Flexion  100                   [facility] Adult PT  Treatment/Exercise - 03/30/19 0001      Ambulation/Gait   Ambulation/Gait  Yes    Ambulation/Gait Assistance  7: Independent    Ambulation Distance (Feet)  50 Feet   2 reps   Gait Pattern  Step-through pattern    Ambulation Surface  Level;Indoor    Gait Comments  Patient exhibits improved gait mechanics, non-antalgic      Exercises   Exercises  Knee/Hip      Knee/Hip Exercises: Stretches   Passive Hamstring Stretch  Left;30 seconds;2 reps    Passive Hamstring Stretch Limitations  seated    Quad Stretch  Left;30 seconds;2 reps    Quad Stretch Limitations  prone with strap    Gastroc Stretch  Left;30 seconds;2 reps    Gastroc Stretch Limitations  standing      Knee/Hip Exercises: Aerobic   Nustep  L4 x5 min LE only      Knee/Hip Exercises: Standing   Lateral Step Up  Left;15 reps;1 set    Lateral Step Up Limitations  use of UE for  support    Forward Step Up  Left;15 reps;1 set    Forward Step Up Limitations  use of UE for support    Functional Squat  10 reps;2 sets      Knee/Hip Exercises: Seated   Long Arc Quad  Left;10 reps;2 sets      Knee/Hip Exercises: Prone   Hamstring Curl  10 reps;2 sets      Manual Therapy   Manual Therapy  Joint mobilization;Passive ROM    Joint Mobilization  Grade III patellar and tibiofemoral    Passive ROM  Knee flexion and extension ROM             PT Education - 03/30/19 1144    Education Details  HEP, importance of stretching and range of motion, not using cane with ambulation in familiar environments    Person(s) Educated  Patient    Methods  Explanation;Demonstration;Handout;Verbal cues    Comprehension  Verbalized understanding;Verbal cues required;Need further instruction       PT Short Term Goals - 03/24/19 1055      PT SHORT TERM GOAL #1   Title  Patient will be independent and consistent with home exercise program in order to maintain progress in PT.    Baseline  Instructed on exercises on eval    Time  6    Period   Weeks    Status  New    Target Date  05/05/19      PT SHORT TERM GOAL #2   Title  Patient will be able to walk community level distances without use of assistive device.    Baseline  Use of SPC    Time  6    Period  Weeks    Status  New    Target Date  05/05/19      PT SHORT TERM GOAL #3   Title  Patient will exhibit left knee range of motion WFL to allow for normalized gait form.    Baseline  Limited knee flexion and extension    Time  6    Period  Weeks    Status  New    Target Date  05/05/19      PT SHORT TERM GOAL #4   Title  Patient will exhibit left knee strength of 5/5 to allow for safe return to work and ability to play with children with no pain or limitation.    Baseline  Strength 4/5 left knee    Time  6    Period  Weeks    Status  New    Target Date  05/05/19      PT SHORT TERM GOAL #5   Title  Patient will display improvement in functional level to < or = 31% disability FOTO.    Baseline  47% disability    Time  6    Period  Weeks    Status  New    Target Date  05/05/19               Plan - 03/30/19 1145    Clinical Impression Statement  Patient tolerated therapy well with no adverse effects. He exhibits improvement in range of motion and is tolerating strengthening well. His gait has improved and he was instructed to wean from cane. He does continue to be limited in his motion and was encouraged to prioritize stretching at home. He would benefit from continued skilled physical therapy to reduce his pain and imporve his range of motion and strength  to maximize his functional ability and allow for safe return to work with no pain or limitation.    PT Treatment/Interventions  ADLs/Self Care Home Management;Cryotherapy;Electrical Stimulation;Iontophoresis 4mg /ml Dexamethasone;Moist Heat;Gait training;Stair training;Functional mobility training;Therapeutic activities;Therapeutic exercise;Balance training;Neuromuscular re-education;Patient/family  education;Manual techniques;Scar mobilization;Passive range of motion;Dry needling;Taping;Joint Manipulations    PT Next Visit Plan  Improve knee flexion and extension range of motion through joint mob and stretching, progress strength, gait training    PT Home Exercise Plan  Stretching: supine knee flexion heel slides with sheet, seated knee flexion scoot, standing knee flexion on step, seated hamstring stretch with pressure over knee; Strengthening: SLR, LAQ, standing hip abduction and extension, mini-squat with counter support, heel raises    Consulted and Agree with Plan of Care  Patient       Patient will benefit from skilled therapeutic intervention in order to improve the following deficits and impairments:  Abnormal gait, Decreased balance, Decreased endurance, Decreased mobility, Difficulty walking, Hypomobility, Improper body mechanics, Decreased range of motion, Decreased activity tolerance, Decreased strength, Pain  Visit Diagnosis: Chronic pain of left knee  Stiffness of left knee, not elsewhere classified  Muscle weakness (generalized)  Other abnormalities of gait and mobility     Problem List Patient Active Problem List   Diagnosis Date Noted  . Status post total left knee replacement 03/04/2019  . Unilateral primary osteoarthritis, left knee 09/08/2017  . Unilateral primary osteoarthritis, right knee 09/08/2017    11/08/2017, PT, DPT, LAT, ATC 03/30/19  12:28 PM Phone: [phone] Fax: [phone]   [facility] [address] [place], [place], [place] Phone: [phone]   Fax:  [phone]  Name: Anthony Boyd MRN: [id number] Date of Birth: 18-Jan-1966

## 2019-04-01 ENCOUNTER — Encounter

## 2019-04-04 ENCOUNTER — Encounter: Payer: Self-pay | Admitting: Physical Therapy

## 2019-04-04 ENCOUNTER — Ambulatory Visit: Payer: BC Managed Care – PPO | Admitting: Physical Therapy

## 2019-04-04 ENCOUNTER — Other Ambulatory Visit: Payer: Self-pay

## 2019-04-04 DIAGNOSIS — G8929 Other chronic pain: Secondary | ICD-10-CM

## 2019-04-04 DIAGNOSIS — R2689 Other abnormalities of gait and mobility: Secondary | ICD-10-CM | POA: Diagnosis not present

## 2019-04-04 DIAGNOSIS — M25562 Pain in left knee: Secondary | ICD-10-CM | POA: Diagnosis not present

## 2019-04-04 DIAGNOSIS — M6281 Muscle weakness (generalized): Secondary | ICD-10-CM

## 2019-04-04 DIAGNOSIS — M25662 Stiffness of left knee, not elsewhere classified: Secondary | ICD-10-CM | POA: Diagnosis not present

## 2019-04-04 NOTE — Therapy (Signed)
[facility] [place], [place], [address] Phone: [phone]   Fax:  [phone]  Physical Therapy Treatment  Patient Details  Name: Anthony Boyd MRN: [id number] Date of Birth: May 26, 1966 Referring Provider (PT): Mcarthur Rossetti, MD   Encounter Date: 04/04/2019  PT End of Session - 04/04/19 1055    Visit Number  3    Number of Visits  12    Date for PT Re-Evaluation  05/05/19    Authorization Type  BCBS    PT Start Time  1049    PT Stop Time  1130    PT Time Calculation (min)  41 min    Activity Tolerance  Patient tolerated treatment well    Behavior During Therapy  [facility] for tasks assessed/performed       Past Medical History:  Diagnosis Date  . Allergy   . 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 Hypertension   . Osteoporosis   . Rheumatoid arthritis(714.0)     Past Surgical History:  Procedure Laterality Date  . [place], 2001  . KNEE SURGERY  2003   left  . TOTAL KNEE ARTHROPLASTY Left 03/04/2019   Procedure: LEFT TOTAL KNEE ARTHROPLASTY;  Surgeon: Mcarthur Rossetti, MD;  Location: [facility];  Service: Orthopedics;  Laterality: Left;    There were no vitals filed for this visit.  Subjective Assessment - 04/04/19 1049    Subjective  Patient reports he is doing well. He is doing more without the cane. He notes only knee tightness.    Currently in Pain?  No/denies    Pain Location  Knee    Pain Orientation  Left    Pain Descriptors / Indicators  Tightness    Pain Frequency  Intermittent         [facility] PT Assessment - 04/04/19 0001      ROM / Strength   AROM / PROM / Strength  AROM      AROM   AROM Assessment Site  Knee    Right/Left Knee  Left    Left Knee Extension  2    Left Knee Flexion  105                   [facility] Adult PT Treatment/Exercise - 04/04/19 0001      Neuro Re-ed    Neuro Re-ed Details   Tandem balance 2x30 sec each, Single leg balance  3x15 sec each      Exercises   Exercises  Knee/Hip      Knee/Hip Exercises: Stretches   Passive Hamstring Stretch  Left;30 seconds;2 reps    Passive Hamstring Stretch Limitations  seated    Quad Stretch  Left;30 seconds;2 reps    Quad Stretch Limitations  prone with strap    Gastroc Stretch  Left;30 seconds;2 reps    Gastroc Stretch Limitations  standing      Knee/Hip Exercises: Aerobic   Nustep  L5 x5 min LE only (seat 9)      Knee/Hip Exercises: Machines for Strengthening   Cybex Knee Extension  35# 2x10    Cybex Knee Flexion  35# 2x10    Cybex Leg Press  65# 2x15      Knee/Hip Exercises: Standing   Hip Abduction  2 sets;10 reps    Abduction Limitations  red band    Hip Extension  2 sets;10 reps    Extension Limitations  red band  Manual Therapy   Manual Therapy  Joint mobilization;Passive ROM    Joint Mobilization  Grade III patellar and tibiofemoral    Passive ROM  Knee flexion and extension ROM             PT Education - 04/04/19 1055    Education Details  HEP    Person(s) Educated  Patient    Methods  Explanation;Demonstration;Verbal cues    Comprehension  Verbalized understanding;Verbal cues required;Need further instruction       PT Short Term Goals - 03/24/19 1055      PT SHORT TERM GOAL #1   Title  Patient will be independent and consistent with home exercise program in order to maintain progress in PT.    Baseline  Instructed on exercises on eval    Time  6    Period  Weeks    Status  New    Target Date  05/05/19      PT SHORT TERM GOAL #2   Title  Patient will be able to walk community level distances without use of assistive device.    Baseline  Use of SPC    Time  6    Period  Weeks    Status  New    Target Date  05/05/19      PT SHORT TERM GOAL #3   Title  Patient will exhibit left knee range of motion WFL to allow for normalized gait form.    Baseline  Limited knee flexion and extension    Time  6    Period  Weeks    Status   New    Target Date  05/05/19      PT SHORT TERM GOAL #4   Title  Patient will exhibit left knee strength of 5/5 to allow for safe return to work and ability to play with children with no pain or limitation.    Baseline  Strength 4/5 left knee    Time  6    Period  Weeks    Status  New    Target Date  05/05/19      PT SHORT TERM GOAL #5   Title  Patient will display improvement in functional level to < or = 31% disability FOTO.    Baseline  47% disability    Time  6    Period  Weeks    Status  New    Target Date  05/05/19               Plan - 04/04/19 1056    Clinical Impression Statement  Patient is progressing well with his motion and strengthening exercises. He did not use his cane this visit and exhibited improved gait. He would benefit from continued skilled PT to reduce his pain and improve his range of motion and strength to maximize his functional ability and allow for safe return to work with no pain or limitation.    PT Treatment/Interventions  ADLs/Self Care Home Management;Cryotherapy;Electrical Stimulation;Iontophoresis 4mg /ml Dexamethasone;Moist Heat;Gait training;Stair training;Functional mobility training;Therapeutic activities;Therapeutic exercise;Balance training;Neuromuscular re-education;Patient/family education;Manual techniques;Scar mobilization;Passive range of motion;Dry needling;Taping;Joint Manipulations    PT Next Visit Plan  Improve knee flexion and extension range of motion through joint mob and stretching, progress strength    PT Home Exercise Plan  Stretching: supine knee flexion heel slides with sheet, seated knee flexion scoot, standing knee flexion on step, seated hamstring stretch with pressure over knee; Strengthening: SLR, LAQ, standing hip abduction and extension with red band around  knees, mini-squat with counter support, heel raises; Balance: single leg balance    Consulted and Agree with Plan of Care  Patient       Patient will benefit  from skilled therapeutic intervention in order to improve the following deficits and impairments:  Abnormal gait, Decreased balance, Decreased endurance, Decreased mobility, Difficulty walking, Hypomobility, Improper body mechanics, Decreased range of motion, Decreased activity tolerance, Decreased strength, Pain  Visit Diagnosis: Chronic pain of left knee  Stiffness of left knee, not elsewhere classified  Muscle weakness (generalized)  Other abnormalities of gait and mobility     Problem List Patient Active Problem List   Diagnosis Date Noted  . Status post total left knee replacement 03/04/2019  . Unilateral primary osteoarthritis, left knee 09/08/2017  . Unilateral primary osteoarthritis, right knee 09/08/2017    Rosana Hoesampbell Sharlet Notaro, PT, DPT, LAT, ATC 04/04/19  12:28 PM Phone: [phone] Fax: [phone]   [facility] [address] [place], [place], [address] Phone: [phone]   Fax:  [phone]  Name: Anthony Boyd MRN: [id number] Date of Birth: 22-Oct-1965

## 2019-04-07 ENCOUNTER — Telehealth: Payer: Self-pay | Admitting: Radiology

## 2019-04-07 ENCOUNTER — Ambulatory Visit: Payer: BC Managed Care – PPO | Admitting: Physical Therapy

## 2019-04-07 ENCOUNTER — Other Ambulatory Visit: Payer: Self-pay

## 2019-04-07 MED ORDER — AMOXICILLIN 500 MG PO TABS: ORAL_TABLET | ORAL | 0 refills | Status: DC

## 2019-04-07 NOTE — Telephone Encounter (Signed)
Dental office aware ok to do dental cleanings for first three months after surgery with antibiotics then none after that

## 2019-04-07 NOTE — Telephone Encounter (Signed)
Received voicemail from [place] with Dr. Maximiano Coss office. Patient is status post left total knee arthroplasty on 03/04/2019. Helene Kelp would like to know if it has been an appropriate amount of time since surgery for dental cleaning and if pre med is needed. Please call Helene Kelp at [phone]

## 2019-04-08 DIAGNOSIS — H524 Presbyopia: Secondary | ICD-10-CM | POA: Diagnosis not present

## 2019-04-08 DIAGNOSIS — H5211 Myopia, right eye: Secondary | ICD-10-CM | POA: Diagnosis not present

## 2019-04-08 DIAGNOSIS — H52222 Regular astigmatism, left eye: Secondary | ICD-10-CM | POA: Diagnosis not present

## 2019-04-08 DIAGNOSIS — H5202 Hypermetropia, left eye: Secondary | ICD-10-CM | POA: Diagnosis not present

## 2019-04-11 ENCOUNTER — Encounter: Payer: Self-pay | Admitting: Physical Therapy

## 2019-04-11 ENCOUNTER — Other Ambulatory Visit: Payer: Self-pay

## 2019-04-11 ENCOUNTER — Ambulatory Visit: Payer: BC Managed Care – PPO | Attending: Orthopaedic Surgery | Admitting: Physical Therapy

## 2019-04-11 DIAGNOSIS — M25562 Pain in left knee: Secondary | ICD-10-CM | POA: Diagnosis not present

## 2019-04-11 DIAGNOSIS — M25662 Stiffness of left knee, not elsewhere classified: Secondary | ICD-10-CM

## 2019-04-11 DIAGNOSIS — M6281 Muscle weakness (generalized): Secondary | ICD-10-CM | POA: Diagnosis not present

## 2019-04-11 DIAGNOSIS — G8929 Other chronic pain: Secondary | ICD-10-CM

## 2019-04-11 DIAGNOSIS — R2689 Other abnormalities of gait and mobility: Secondary | ICD-10-CM

## 2019-04-11 NOTE — Therapy (Signed)
[facility] [place], [place], [address] Phone: [phone]   Fax:  [phone]  Physical Therapy Treatment  Patient Details  Name: Anthony Boyd MRN: [id number] Date of Birth: 1965/11/15 Referring Provider (PT): Mcarthur Rossetti, MD   Encounter Date: 04/11/2019  PT End of Session - 04/11/19 1411    Visit Number  4    Number of Visits  12    Date for PT Re-Evaluation  05/05/19    Authorization Type  BCBS    PT Start Time  1415    PT Stop Time  1500    PT Time Calculation (min)  45 min    Activity Tolerance  Patient tolerated treatment well    Behavior During Therapy  [facility] for tasks assessed/performed       Past Medical History:  Diagnosis Date  . Allergy   . 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 Hypertension   . Osteoporosis   . Rheumatoid arthritis(714.0)     Past Surgical History:  Procedure Laterality Date  . [place], 2001  . KNEE SURGERY  2003   left  . TOTAL KNEE ARTHROPLASTY Left 03/04/2019   Procedure: LEFT TOTAL KNEE ARTHROPLASTY;  Surgeon: Mcarthur Rossetti, MD;  Location: [facility];  Service: Orthopedics;  Laterality: Left;    There were no vitals filed for this visit.  Subjective Assessment - 04/11/19 1410    Subjective  Patient reports he continues to do well and improve. He does feel a little stiffer today but feels it is due to the weather and he worked out in the yard this weekend.    Currently in Pain?  No/denies         [facility] PT Assessment - 04/11/19 0001      ROM / Strength   AROM / PROM / Strength  AROM      AROM   AROM Assessment Site  Knee    Right/Left Knee  Left    Left Knee Extension  2    Left Knee Flexion  110                   [facility] Adult PT Treatment/Exercise - 04/11/19 0001      Neuro Re-ed    Neuro Re-ed Details   --      Exercises   Exercises  Knee/Hip      Knee/Hip Exercises: Stretches   Passive Hamstring Stretch   Left;2 reps;30 seconds    Passive Hamstring Stretch Limitations  seated with pressure over knee    Quad Stretch  Left;2 reps;30 seconds    Quad Stretch Limitations  prone with strap    Gastroc Stretch  Left;2 reps;30 seconds    Gastroc Stretch Limitations  standing      Knee/Hip Exercises: Aerobic   Nustep  L5 x5 min LE only (seat 9)      Knee/Hip Exercises: Machines for Strengthening   Cybex Knee Extension  35# 2x10    Cybex Knee Flexion  35# 2x10    Cybex Leg Press  65# 2x15      Knee/Hip Exercises: Standing   Heel Raises  20 reps    Hip Abduction  --    Abduction Limitations  --    Hip Extension  2 sets;10 reps    Extension Limitations  green band    Forward Step Up  2 sets;15 reps    Forward Step Up Limitations  8",  opposite UE for balance    Other Standing Knee Exercises  Standing TKE with yellow band x15    Other Standing Knee Exercises  Lateral band walk with green band around knees 3x10      Manual Therapy   Manual Therapy  Joint mobilization;Passive ROM    Joint Mobilization  Grade III patellar and tibiofemoral    Passive ROM  Knee flexion and extension ROM             PT Education - 04/11/19 1411    Education Details  HEP    Person(s) Educated  Patient    Methods  Explanation;Demonstration;Verbal cues;Handout    Comprehension  Verbalized understanding;Verbal cues required       PT Short Term Goals - 03/24/19 1055      PT SHORT TERM GOAL #1   Title  Patient will be independent and consistent with home exercise program in order to maintain progress in PT.    Baseline  Instructed on exercises on eval    Time  6    Period  Weeks    Status  New    Target Date  05/05/19      PT SHORT TERM GOAL #2   Title  Patient will be able to walk community level distances without use of assistive device.    Baseline  Use of SPC    Time  6    Period  Weeks    Status  New    Target Date  05/05/19      PT SHORT TERM GOAL #3   Title  Patient will exhibit left knee  range of motion WFL to allow for normalized gait form.    Baseline  Limited knee flexion and extension    Time  6    Period  Weeks    Status  New    Target Date  05/05/19      PT SHORT TERM GOAL #4   Title  Patient will exhibit left knee strength of 5/5 to allow for safe return to work and ability to play with children with no pain or limitation.    Baseline  Strength 4/5 left knee    Time  6    Period  Weeks    Status  New    Target Date  05/05/19      PT SHORT TERM GOAL #5   Title  Patient will display improvement in functional level to < or = 31% disability FOTO.    Baseline  47% disability    Time  6    Period  Weeks    Status  New    Target Date  05/05/19               Plan - 04/11/19 1411    Clinical Impression Statement  Patient continues to exhibit improvement in motion and has progressed to completely off his cane. He continues to exhibit a limitation in his motion especially in knee flexion. His strengthening is going well and he was encouraged to do more on his feet and go up/down stairs to improve activity tolerance. He would benefit from continued skilled PT to reduce his pain and improve his range of motion and strength to maximize his functional ability and allow for safe return to work with no pain or limitation.    PT Treatment/Interventions  ADLs/Self Care Home Management;Cryotherapy;Electrical Stimulation;Iontophoresis 4mg /ml Dexamethasone;Moist Heat;Gait training;Stair training;Functional mobility training;Therapeutic activities;Therapeutic exercise;Balance training;Neuromuscular re-education;Patient/family education;Manual techniques;Scar mobilization;Passive range of motion;Dry needling;Taping;Joint Manipulations  PT Next Visit Plan  Improve knee flexion and extension range of motion through joint mob and stretching, progress strength    PT Home Exercise Plan  Stretching: supine knee flexion heel slides with sheet, seated knee flexion scoot, standing knee  flexion on step, seated hamstring stretch with pressure over knee; Strengthening: SLR, LAQ, standing hip abduction and extension with green band around knees, squat with counter support, heel raises, steps; Balance: single leg balance    Consulted and Agree with Plan of Care  Patient       Patient will benefit from skilled therapeutic intervention in order to improve the following deficits and impairments:  Abnormal gait, Decreased balance, Decreased endurance, Decreased mobility, Difficulty walking, Hypomobility, Improper body mechanics, Decreased range of motion, Decreased activity tolerance, Decreased strength, Pain  Visit Diagnosis: Chronic pain of left knee  Stiffness of left knee, not elsewhere classified  Muscle weakness (generalized)  Other abnormalities of gait and mobility     Problem List Patient Active Problem List   Diagnosis Date Noted  . Status post total left knee replacement 03/04/2019  . Unilateral primary osteoarthritis, left knee 09/08/2017  . Unilateral primary osteoarthritis, right knee 09/08/2017    Rosana Hoesampbell Lynnetta Tom, PT, DPT, LAT, ATC 04/11/19  3:08 PM Phone: [phone] Fax: [phone]   [facility] [address] [place], [place], [address] Phone: [phone]   Fax:  [phone]  Name: Tomma Rakersony D Raney MRN: [id number] Date of Birth: [birth year]

## 2019-04-12 DIAGNOSIS — M1712 Unilateral primary osteoarthritis, left knee: Secondary | ICD-10-CM | POA: Diagnosis not present

## 2019-04-12 DIAGNOSIS — Z79899 Other long term (current) drug therapy: Secondary | ICD-10-CM | POA: Diagnosis not present

## 2019-04-12 DIAGNOSIS — M0579 Rheumatoid arthritis with rheumatoid factor of multiple sites without organ or systems involvement: Secondary | ICD-10-CM | POA: Diagnosis not present

## 2019-04-12 DIAGNOSIS — M25539 Pain in unspecified wrist: Secondary | ICD-10-CM | POA: Diagnosis not present

## 2019-04-13 ENCOUNTER — Telehealth: Payer: Self-pay | Admitting: Orthopaedic Surgery

## 2019-04-13 NOTE — Telephone Encounter (Signed)
Note ready at front desk.

## 2019-04-13 NOTE — Telephone Encounter (Signed)
Patient called stating that his employer needs a Out of Work note that states he will remain out of work until December 25.  CB#[phone].  Thank you.

## 2019-04-14 ENCOUNTER — Encounter: Payer: Self-pay | Admitting: Physical Therapy

## 2019-04-14 ENCOUNTER — Ambulatory Visit: Payer: BC Managed Care – PPO | Admitting: Physical Therapy

## 2019-04-14 ENCOUNTER — Other Ambulatory Visit: Payer: Self-pay

## 2019-04-14 DIAGNOSIS — G8929 Other chronic pain: Secondary | ICD-10-CM

## 2019-04-14 DIAGNOSIS — M25562 Pain in left knee: Secondary | ICD-10-CM

## 2019-04-14 DIAGNOSIS — R2689 Other abnormalities of gait and mobility: Secondary | ICD-10-CM | POA: Diagnosis not present

## 2019-04-14 DIAGNOSIS — M25662 Stiffness of left knee, not elsewhere classified: Secondary | ICD-10-CM

## 2019-04-14 DIAGNOSIS — M6281 Muscle weakness (generalized): Secondary | ICD-10-CM

## 2019-04-14 NOTE — Therapy (Signed)
[facility] [address] [place], [place], [address] Phone: [phone]   Fax:  [phone]  Physical Therapy Treatment  Patient Details  Name: DASHEL GOINES MRN: [id number] Date of Birth: [birth year] Referring Provider (PT): Kathryne Hitch, MD   Encounter Date: 04/14/2019  PT End of Session - 04/14/19 1210    Visit Number  5    Number of Visits  12    Date for PT Re-Evaluation  05/05/19    Authorization Type  BCBS    PT Start Time  1130    PT Stop Time  1210    PT Time Calculation (min)  40 min    Activity Tolerance  Patient tolerated treatment well    Behavior During Therapy  [facility] for tasks assessed/performed       Past Medical History:  Diagnosis Date  . Allergy   . 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 Hypertension   . Osteoporosis   . Rheumatoid arthritis(714.0)     Past Surgical History:  Procedure Laterality Date  . FOOT SURGERY  1998, 2001  . KNEE SURGERY  2003   left  . TOTAL KNEE ARTHROPLASTY Left 03/04/2019   Procedure: LEFT TOTAL KNEE ARTHROPLASTY;  Surgeon: Kathryne Hitch, MD;  Location: [facility];  Service: Orthopedics;  Laterality: Left;    There were no vitals filed for this visit.  Subjective Assessment - 04/14/19 1209    Subjective  Patient reports he continues to improve and is doing well.    Currently in Pain?  No/denies         [facility] PT Assessment - 04/14/19 0001      ROM / Strength   AROM / PROM / Strength  AROM;Strength      AROM   AROM Assessment Site  Knee    Right/Left Knee  Left    Left Knee Extension  2    Left Knee Flexion  110      Strength   Strength Assessment Site  Knee    Right/Left Knee  Left    Left Knee Flexion  4+/5    Left Knee Extension  4+/5                   [facility] Adult PT Treatment/Exercise - 04/14/19 0001      Exercises   Exercises  Knee/Hip      Knee/Hip Exercises: Stretches   Passive Hamstring Stretch  30 seconds    Passive Hamstring Stretch Limitations  seated with pressure over knee    Quad Stretch  30 seconds    Quad Stretch Limitations  prone with strap    Gastroc Stretch  30 seconds    Gastroc Stretch Limitations  standing      Knee/Hip Exercises: Aerobic   Nustep  L6 x5 min LE only (seat 9)      Knee/Hip Exercises: Machines for Strengthening   Cybex Knee Extension  45# 2x10    Cybex Knee Flexion  45# 2x10    Cybex Leg Press  75# x15, 85# 2x15      Knee/Hip Exercises: Standing   Heel Raises  20 reps    Forward Step Up  2 sets;15 reps    Forward Step Up Limitations  8", no UE support    Other Standing Knee Exercises  Lateral band walk with green band around knees 3x10             PT Education - 04/14/19  1210    Education Details  HEP, methods to begin getting ready for work    [employer]) Educated  Patient    Methods  Explanation;Demonstration;Verbal cues    Comprehension  Verbalized understanding       PT Short Term Goals - 04/14/19 1213      PT SHORT TERM GOAL #1   Title  Patient will be independent and consistent with home exercise program in order to maintain progress in PT.    Baseline  Instructed on exercises on eval    Time  6    Period  Weeks    Status  Achieved    Target Date  05/05/19      PT SHORT TERM GOAL #2   Title  Patient will be able to walk community level distances without use of assistive device.    Baseline  Use of SPC    Time  6    Period  Weeks    Status  Achieved    Target Date  05/05/19      PT SHORT TERM GOAL #3   Title  Patient will exhibit left knee range of motion WFL to allow for normalized gait form.    Baseline  Limited knee flexion and extension    Time  6    Period  Weeks    Status  On-going    Target Date  05/05/19      PT SHORT TERM GOAL #4   Title  Patient will exhibit left knee strength of 5/5 to allow for safe return to work and ability to play with children with no pain or limitation.    Baseline  Strength 4/5 left knee     Time  6    Period  Weeks    Status  On-going    Target Date  05/05/19      PT SHORT TERM GOAL #5   Title  Patient will display improvement in functional level to < or = 31% disability FOTO.    Baseline  47% disability    Time  6    Period  Weeks    Status  New    Target Date  05/05/19               Plan - 04/14/19 1210    Clinical Impression Statement  Patient tolerated therapy well and is tolerating progressions in his strength well. He didn't exhibit any increase in motion this visit and he was encouraged to work on his bending at home. He was instructed to begin staying on his feet, walking, and wearing his steel toe boots mor and gradually increasing so he can begin improving his tolerance for these activities for work. He would benefit from continued skilled PT to progress his motion and strength to allow for a safe return to work.    PT Treatment/Interventions  ADLs/Self Care Home Management;Cryotherapy;Electrical Stimulation;Iontophoresis 4mg /ml Dexamethasone;Moist Heat;Gait training;Stair training;Functional mobility training;Therapeutic activities;Therapeutic exercise;Balance training;Neuromuscular re-education;Patient/family education;Manual techniques;Scar mobilization;Passive range of motion;Dry needling;Taping;Joint Manipulations    PT Next Visit Plan  Improve knee flexion and extension range of motion through joint mob and stretching, progress strength    PT Home Exercise Plan  Stretching: supine knee flexion heel slides with sheet, knee flexion stretch with foot on step, standing knee flexion on step, seated hamstring stretch with pressure over knee; Strengthening: SLR, LAQ, standing hip abduction and extension with green band around knees, squat with counter support, heel raises, steps; Balance: single leg balance  Consulted and Agree with Plan of Care  Patient       Patient will benefit from skilled therapeutic intervention in order to improve the following deficits  and impairments:  Abnormal gait, Decreased balance, Decreased endurance, Decreased mobility, Difficulty walking, Hypomobility, Improper body mechanics, Decreased range of motion, Decreased activity tolerance, Decreased strength, Pain  Visit Diagnosis: Chronic pain of left knee  Stiffness of left knee, not elsewhere classified  Muscle weakness (generalized)  Other abnormalities of gait and mobility     Problem List Patient Active Problem List   Diagnosis Date Noted  . Status post total left knee replacement 03/04/2019  . Unilateral primary osteoarthritis, left knee 09/08/2017  . Unilateral primary osteoarthritis, right knee 09/08/2017    Rosana Hoes, PT, DPT, LAT, ATC 04/14/19  12:17 PM Phone: [phone] Fax: [phone]   [facility] [address] [place], [place], [address] Phone: [phone]   Fax:  [phone]  Name: REVANTH TRABUCCO MRN: [id number] Date of Birth: 09-19-1965

## 2019-04-18 ENCOUNTER — Encounter: Payer: Self-pay | Admitting: Physical Therapy

## 2019-04-18 ENCOUNTER — Ambulatory Visit: Payer: BC Managed Care – PPO | Admitting: Physical Therapy

## 2019-04-18 ENCOUNTER — Other Ambulatory Visit: Payer: Self-pay

## 2019-04-18 DIAGNOSIS — G8929 Other chronic pain: Secondary | ICD-10-CM | POA: Diagnosis not present

## 2019-04-18 DIAGNOSIS — M25662 Stiffness of left knee, not elsewhere classified: Secondary | ICD-10-CM

## 2019-04-18 DIAGNOSIS — M6281 Muscle weakness (generalized): Secondary | ICD-10-CM

## 2019-04-18 DIAGNOSIS — M25562 Pain in left knee: Secondary | ICD-10-CM | POA: Diagnosis not present

## 2019-04-18 DIAGNOSIS — R2689 Other abnormalities of gait and mobility: Secondary | ICD-10-CM | POA: Diagnosis not present

## 2019-04-18 NOTE — Therapy (Signed)
[facility] [address] [place], [place], [address] Phone: [phone]   Fax:  [phone]  Physical Therapy Treatment  Patient Details  Name: Anthony Boyd MRN: [id number] Date of Birth: 04-12-1966 Referring Provider (PT): Kathryne Hitch, MD   Encounter Date: 04/18/2019  PT End of Session - 04/18/19 1223    Visit Number  6    Number of Visits  12    Date for PT Re-Evaluation  05/05/19    Authorization Type  BCBS    PT Start Time  1218    PT Stop Time  1300    PT Time Calculation (min)  42 min    Activity Tolerance  Patient tolerated treatment well    Behavior During Therapy  [facility] for tasks assessed/performed       Past Medical History:  Diagnosis Date  . Allergy   . 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 Hypertension   . Osteoporosis   . Rheumatoid arthritis(714.0)     Past Surgical History:  Procedure Laterality Date  . FOOT SURGERY  1998, 2001  . KNEE SURGERY  2003   left  . TOTAL KNEE ARTHROPLASTY Left 03/04/2019   Procedure: LEFT TOTAL KNEE ARTHROPLASTY;  Surgeon: Kathryne Hitch, MD;  Location: [facility];  Service: Orthopedics;  Laterality: Left;    There were no vitals filed for this visit.  Subjective Assessment - 04/18/19 1221    Subjective  Patient reports he is doing well. His knee is feeling a little stiff today.    Currently in Pain?  No/denies         [facility] PT Assessment - 04/18/19 0001      ROM / Strength   AROM / PROM / Strength  AROM      AROM   AROM Assessment Site  Knee    Right/Left Knee  Left    Left Knee Extension  2    Left Knee Flexion  110                   [facility] Adult PT Treatment/Exercise - 04/18/19 0001      Neuro Re-ed    Neuro Re-ed Details   Single leg balance 3x20 sec each, on Airex 3x15 sec each      Exercises   Exercises  Knee/Hip      Knee/Hip Exercises: Stretches   Passive Hamstring Stretch  30 seconds    Passive Hamstring Stretch  Limitations  seated with pressure over knee    Quad Stretch  30 seconds;2 reps    Quad Stretch Limitations  prone with strap    Gastroc Stretch  30 seconds;2 reps    Gastroc Stretch Limitations  standing      Knee/Hip Exercises: Aerobic   Nustep  L6 x5 min LE only (seat 9)      Knee/Hip Exercises: Machines for Strengthening   Cybex Knee Extension  45# 2x12    Cybex Knee Flexion  45# 2x12    Cybex Leg Press  100# 2x12      Knee/Hip Exercises: Standing   Heel Raises  20 reps;2 sets    Forward Step Up  2 sets;15 reps    Forward Step Up Limitations  8", no UE support             PT Education - 04/18/19 1222    Education Details  HEP, methods to prepare himself for work    [employer]) Educated  Patient  Methods  Explanation;Demonstration;Verbal cues    Comprehension  Verbalized understanding;Verbal cues required;Need further instruction       PT Short Term Goals - 04/14/19 1213      PT SHORT TERM GOAL #1   Title  Patient will be independent and consistent with home exercise program in order to maintain progress in PT.    Baseline  Instructed on exercises on eval    Time  6    Period  Weeks    Status  Achieved    Target Date  05/05/19      PT SHORT TERM GOAL #2   Title  Patient will be able to walk community level distances without use of assistive device.    Baseline  Use of SPC    Time  6    Period  Weeks    Status  Achieved    Target Date  05/05/19      PT SHORT TERM GOAL #3   Title  Patient will exhibit left knee range of motion WFL to allow for normalized gait form.    Baseline  Limited knee flexion and extension    Time  6    Period  Weeks    Status  On-going    Target Date  05/05/19      PT SHORT TERM GOAL #4   Title  Patient will exhibit left knee strength of 5/5 to allow for safe return to work and ability to play with children with no pain or limitation.    Baseline  Strength 4/5 left knee    Time  6    Period  Weeks    Status  On-going    Target  Date  05/05/19      PT SHORT TERM GOAL #5   Title  Patient will display improvement in functional level to < or = 31% disability FOTO.    Baseline  47% disability    Time  6    Period  Weeks    Status  New    Target Date  05/05/19               Plan - 04/18/19 1223    Clinical Impression Statement  Patient is doing well with his strengthening exercises with no increased pain. He did not exhibit any improvement in knee ROM since last visit and was encouraged to continue his stretching to specifically improve knee flexion. He was instructed to continue progressing activity level and time on his feet to help prepare for work. He would benefit from continued skilled PT to progress his motion and strength to allow for a safe return to work.    PT Treatment/Interventions  ADLs/Self Care Home Management;Cryotherapy;Electrical Stimulation;Iontophoresis 4mg /ml Dexamethasone;Moist Heat;Gait training;Stair training;Functional mobility training;Therapeutic activities;Therapeutic exercise;Balance training;Neuromuscular re-education;Patient/family education;Manual techniques;Scar mobilization;Passive range of motion;Dry needling;Taping;Joint Manipulations    PT Next Visit Plan  Improve knee flexion and extension range of motion through joint mob and stretching, progress strength    PT Home Exercise Plan  Stretching: supine knee flexion heel slides with sheet, knee flexion stretch with foot on step, standing knee flexion on step, seated hamstring stretch with pressure over knee; Strengthening: SLR, LAQ, standing hip abduction and extension with green band around knees, squat with counter support, heel raises, steps; Balance: single leg balance    Consulted and Agree with Plan of Care  Patient       Patient will benefit from skilled therapeutic intervention in order to improve the following deficits and impairments:  Abnormal gait, Decreased balance, Decreased endurance, Decreased mobility, Difficulty  walking, Hypomobility, Improper body mechanics, Decreased range of motion, Decreased activity tolerance, Decreased strength, Pain  Visit Diagnosis: Chronic pain of left knee  Stiffness of left knee, not elsewhere classified  Muscle weakness (generalized)  Other abnormalities of gait and mobility     Problem List Patient Active Problem List   Diagnosis Date Noted  . Status post total left knee replacement 03/04/2019  . Unilateral primary osteoarthritis, left knee 09/08/2017  . Unilateral primary osteoarthritis, right knee 09/08/2017    Rosana Hoes, PT, DPT, LAT, ATC 04/18/19  1:05 PM Phone: [phone] Fax: [phone]   [facility] Outpatient [facility] [address] [place], [place], [address] Phone: [phone]   Fax:  [phone]  Name: Anthony Boyd MRN: [id number] Date of Birth: 03/13/1966

## 2019-04-21 ENCOUNTER — Ambulatory Visit (INDEPENDENT_AMBULATORY_CARE_PROVIDER_SITE_OTHER): Payer: BC Managed Care – PPO | Admitting: Orthopaedic Surgery

## 2019-04-21 ENCOUNTER — Encounter: Payer: Self-pay | Admitting: Orthopaedic Surgery

## 2019-04-21 DIAGNOSIS — Z96652 Presence of left artificial knee joint: Secondary | ICD-10-CM

## 2019-04-21 NOTE — Progress Notes (Signed)
The patient is now 48 days status post a left total knee arthroplasty.  That is just between 6 and 7 weeks.  He is scheduled to return to work this month but I do not feel medically he is able to just yet.  He has been going through extensive physical therapy to get his knee bending better and getting stronger.  He still having weakness in that knee as well as requiring pain narcotic pain medication.  With that being said I am not comfortable with him driving or operating any heavy machinery or being in the work environment.  On exam today his extension lacks full extension by about 3 degrees and his flexion is to 105 degrees.  His calf is soft.  His quads are weak.  The knee feels ligamentously stable on the left side.  He will continue to push himself through physical therapy.  I feel that the most appropriate return to work today without restrictions and for driving as well would be December 28.  All question concerns were answered and addressed.  I will see him back in 4 weeks to see how is doing overall.

## 2019-04-25 ENCOUNTER — Ambulatory Visit: Payer: BC Managed Care – PPO | Admitting: Physical Therapy

## 2019-04-25 ENCOUNTER — Other Ambulatory Visit: Payer: Self-pay

## 2019-04-25 ENCOUNTER — Encounter: Payer: Self-pay | Admitting: Physical Therapy

## 2019-04-25 DIAGNOSIS — R2689 Other abnormalities of gait and mobility: Secondary | ICD-10-CM | POA: Diagnosis not present

## 2019-04-25 DIAGNOSIS — G8929 Other chronic pain: Secondary | ICD-10-CM

## 2019-04-25 DIAGNOSIS — M25562 Pain in left knee: Secondary | ICD-10-CM | POA: Diagnosis not present

## 2019-04-25 DIAGNOSIS — M6281 Muscle weakness (generalized): Secondary | ICD-10-CM

## 2019-04-25 DIAGNOSIS — M25662 Stiffness of left knee, not elsewhere classified: Secondary | ICD-10-CM

## 2019-04-25 NOTE — Therapy (Signed)
[facility] [place], [place], [address] Phone: [phone]   Fax:  [phone]  Physical Therapy Treatment  Patient Details  Name: Anthony Boyd MRN: [id number] Date of Birth: December 18, 1965 Referring Provider (PT): Mcarthur Rossetti, MD   Encounter Date: 04/25/2019  PT End of Session - 04/25/19 1010    Visit Number  7    Number of Visits  12    Date for PT Re-Evaluation  05/05/19    Authorization Type  BCBS    PT Start Time  1006    PT Stop Time  1045    PT Time Calculation (min)  39 min    Activity Tolerance  Patient tolerated treatment well       Past Medical History:  Diagnosis Date  . Allergy   . 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 Hypertension   . Osteoporosis   . Rheumatoid arthritis(714.0)     Past Surgical History:  Procedure Laterality Date  . [place], 2001  . KNEE SURGERY  2003   left  . TOTAL KNEE ARTHROPLASTY Left 03/04/2019   Procedure: LEFT TOTAL KNEE ARTHROPLASTY;  Surgeon: Mcarthur Rossetti, MD;  Location: [facility];  Service: Orthopedics;  Laterality: Left;    There were no vitals filed for this visit.  Subjective Assessment - 04/25/19 1009    Subjective  Patient reports he is doing well. He was walking around the house and yard over the weekend. He does get stiff and stated when his leg is hanging down he will get occasional tingling in his toes that is relieved when he elevates the leg.    Currently in Pain?  No/denies         [facility] PT Assessment - 04/25/19 0001      ROM / Strength   AROM / PROM / Strength  AROM      AROM   AROM Assessment Site  Knee    Right/Left Knee  Left    Left Knee Extension  2    Left Knee Flexion  110   112 passive                  [facility] Adult PT Treatment/Exercise - 04/25/19 0001      Exercises   Exercises  Knee/Hip      Knee/Hip Exercises: Stretches   Passive Hamstring Stretch  30 seconds;3 reps    Passive Hamstring Stretch Limitations  seated with pressure over knee    Quad Stretch  30 seconds;3 reps    Quad Stretch Limitations  prone with strap    Gastroc Stretch  30 seconds;3 reps    Gastroc Stretch Limitations  standing      Knee/Hip Exercises: Aerobic   Nustep  L6 x5 min LE only (seat 9)      Knee/Hip Exercises: Machines for Strengthening   Cybex Knee Extension  45# 2x12    Cybex Knee Flexion  45# 2x12    Cybex Leg Press  95# x10, 105# x10   cued to work on controlled TKE      Knee/Hip Exercises: Standing   Forward Step Up  2 sets;15 reps    Forward Step Up Limitations  8", no UE support    Other Standing Knee Exercises  Lateral band walk with green band around knees 2x20      Manual Therapy   Manual Therapy  Joint mobilization;Passive ROM    Joint Mobilization  Grade  III patellar and tibiofemoral    Passive ROM  Knee flexion and extension ROM             PT Education - 04/25/19 1009    Education Details  HEP    Person(s) Educated  Patient    Methods  Explanation;Demonstration;Verbal cues    Comprehension  Verbalized understanding;Returned demonstration;Need further instruction;Verbal cues required       PT Short Term Goals - 04/14/19 1213      PT SHORT TERM GOAL #1   Title  Patient will be independent and consistent with home exercise program in order to maintain progress in PT.    Baseline  Instructed on exercises on eval    Time  6    Period  Weeks    Status  Achieved    Target Date  05/05/19      PT SHORT TERM GOAL #2   Title  Patient will be able to walk community level distances without use of assistive device.    Baseline  Use of SPC    Time  6    Period  Weeks    Status  Achieved    Target Date  05/05/19      PT SHORT TERM GOAL #3   Title  Patient will exhibit left knee range of motion WFL to allow for normalized gait form.    Baseline  Limited knee flexion and extension    Time  6    Period  Weeks    Status  On-going    Target Date   05/05/19      PT SHORT TERM GOAL #4   Title  Patient will exhibit left knee strength of 5/5 to allow for safe return to work and ability to play with children with no pain or limitation.    Baseline  Strength 4/5 left knee    Time  6    Period  Weeks    Status  On-going    Target Date  05/05/19      PT SHORT TERM GOAL #5   Title  Patient will display improvement in functional level to < or = 31% disability FOTO.    Baseline  47% disability    Time  6    Period  Weeks    Status  New    Target Date  05/05/19               Plan - 04/25/19 1010    Clinical Impression Statement  Patient continues to exhibit limitation in knee motion especially with flexion. He is slowly improving and was encouraged to prioritize knee bending at home. He is progressing well with his strengthening exercises and did not report any increased discomfort this visit. He would benefit from continued skilled PT to progress his motion and strength to allow for safe return to work.    PT Treatment/Interventions  ADLs/Self Care Home Management;Cryotherapy;Electrical Stimulation;Iontophoresis 4mg /ml Dexamethasone;Moist Heat;Gait training;Stair training;Functional mobility training;Therapeutic activities;Therapeutic exercise;Balance training;Neuromuscular re-education;Patient/family education;Manual techniques;Scar mobilization;Passive range of motion;Dry needling;Taping;Joint Manipulations    PT Next Visit Plan  Improve knee flexion and extension range of motion through joint mob and stretching, progress strength    PT Home Exercise Plan  Stretching: supine knee flexion heel slides with sheet, knee flexion stretch with foot on step, standing knee flexion on step, seated hamstring stretch with pressure over knee; Strengthening: SLR, LAQ, standing hip abduction and extension with green band around knees, squat with counter support, heel raises, steps; Balance: single  leg balance    Consulted and Agree with Plan of Care   Patient       Patient will benefit from skilled therapeutic intervention in order to improve the following deficits and impairments:  Abnormal gait, Decreased balance, Decreased endurance, Decreased mobility, Difficulty walking, Hypomobility, Improper body mechanics, Decreased range of motion, Decreased activity tolerance, Decreased strength, Pain  Visit Diagnosis: Chronic pain of left knee  Stiffness of left knee, not elsewhere classified  Muscle weakness (generalized)  Other abnormalities of gait and mobility     Problem List Patient Active Problem List   Diagnosis Date Noted  . Status post total left knee replacement 03/04/2019  . Unilateral primary osteoarthritis, left knee 09/08/2017  . Unilateral primary osteoarthritis, right knee 09/08/2017    Rosana Hoes, PT, DPT, LAT, ATC 04/25/19  11:30 AM Phone: [phone] Fax: [phone]   [facility] Outpatient Rehabilitation [facility] [address] [place], [place], [address] Phone: [phone]   Fax:  [phone]  Name: Anthony Boyd MRN: [id number] Date of Birth: 03/30/1966

## 2019-04-26 ENCOUNTER — Telehealth: Payer: Self-pay | Admitting: Orthopaedic Surgery

## 2019-04-26 DIAGNOSIS — H16223 Keratoconjunctivitis sicca, not specified as Sjogren's, bilateral: Secondary | ICD-10-CM | POA: Diagnosis not present

## 2019-04-26 NOTE — Telephone Encounter (Signed)
Message sent in error

## 2019-05-02 ENCOUNTER — Encounter: Payer: Self-pay | Admitting: Physical Therapy

## 2019-05-02 ENCOUNTER — Other Ambulatory Visit: Payer: Self-pay

## 2019-05-02 ENCOUNTER — Ambulatory Visit: Payer: BC Managed Care – PPO | Admitting: Physical Therapy

## 2019-05-02 DIAGNOSIS — G8929 Other chronic pain: Secondary | ICD-10-CM | POA: Diagnosis not present

## 2019-05-02 DIAGNOSIS — M6281 Muscle weakness (generalized): Secondary | ICD-10-CM | POA: Diagnosis not present

## 2019-05-02 DIAGNOSIS — M25562 Pain in left knee: Secondary | ICD-10-CM | POA: Diagnosis not present

## 2019-05-02 DIAGNOSIS — M25662 Stiffness of left knee, not elsewhere classified: Secondary | ICD-10-CM | POA: Diagnosis not present

## 2019-05-02 DIAGNOSIS — R2689 Other abnormalities of gait and mobility: Secondary | ICD-10-CM | POA: Diagnosis not present

## 2019-05-02 NOTE — Therapy (Signed)
[facility] Outpatient Rehabilitation [facility] [address] [place], [place], [address] Phone: [phone]   Fax:  [phone]  Physical Therapy Treatment  Patient Details  Name: Anthony Boyd MRN: [id number] Date of Birth: [birth year] Referring Provider (PT): Kathryne Hitch, MD   Encounter Date: 05/02/2019  PT End of Session - 05/02/19 1028    Visit Number  8    Number of Visits  13    Date for PT Re-Evaluation  06/06/19    Authorization Type  BCBS    PT Start Time  1005    PT Stop Time  1045    PT Time Calculation (min)  40 min    Activity Tolerance  Patient tolerated treatment well    Behavior During Therapy  [facility] for tasks assessed/performed       Past Medical History:  Diagnosis Date  . Allergy   . 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 Hypertension   . Osteoporosis   . Rheumatoid arthritis(714.0)     Past Surgical History:  Procedure Laterality Date  . FOOT SURGERY  1998, 2001  . KNEE SURGERY  2003   left  . TOTAL KNEE ARTHROPLASTY Left 03/04/2019   Procedure: LEFT TOTAL KNEE ARTHROPLASTY;  Surgeon: Kathryne Hitch, MD;  Location: [facility];  Service: Orthopedics;  Laterality: Left;    There were no vitals filed for this visit.  Subjective Assessment - 05/02/19 1023    Subjective  Patient reports he is doing well. He has been walking more and doing more throughout the day and he put on his steel toe boots this weekend to walk around. His main issue is morning stiffness but this improves throughout the day.    Currently in Pain?  No/denies         [facility] PT Assessment - 05/02/19 0001      Assessment   Medical Diagnosis  s/p Left TKA, right knee pain, right knee stiffness, right leg weakness    Referring Provider (PT)  Kathryne Hitch, MD    Onset Date/Surgical Date  03/04/19    Next MD Visit  05/19/2019      Observation/Other Assessments   Focus on Therapeutic Outcomes (FOTO)   25% limitation      ROM / Strength    AROM / PROM / Strength  AROM      AROM   AROM Assessment Site  Knee    Right/Left Knee  Left    Left Knee Extension  2    Left Knee Flexion  110      Strength   Strength Assessment Site  Knee    Right/Left Knee  Left;Right    Right Knee Flexion  5/5    Right Knee Extension  5/5    Left Knee Flexion  4+/5    Left Knee Extension  4+/5      Balance   Balance Assessed  Yes      Static Standing Balance   Static Standing - Comment/# of Minutes  Single leg stance 20 sec                   [facility] Adult PT Treatment/Exercise - 05/02/19 0001      Exercises   Exercises  Knee/Hip      Knee/Hip Exercises: Stretches   Passive Hamstring Stretch  30 seconds;3 reps    Passive Hamstring Stretch Limitations  seated with pressure over knee    Quad Stretch  30 seconds;3 reps  Quad Stretch Limitations  prone with strap    Gastroc Stretch  30 seconds;3 reps    Gastroc Stretch Limitations  standing      Knee/Hip Exercises: Aerobic   Nustep  L6 x5 min LE only (seat 9)      Knee/Hip Exercises: Machines for Strengthening   Cybex Knee Extension  45# 2x15    Cybex Knee Flexion  45# 2x15      Knee/Hip Exercises: Standing   Heel Raises  20 reps;2 sets    Forward Step Up  15 reps    Forward Step Up Limitations  8", no UE support    Step Down  2 sets;10 reps    Step Down Limitations  lateral heel tap on 8" step    Other Standing Knee Exercises  Lateral band walk with green band around knees 2x20      Knee/Hip Exercises: Seated   Sit to Sand  2 sets;10 reps;without UE support      Knee/Hip Exercises: Supine   Straight Leg Raises  2 sets;10 reps      Manual Therapy   Manual Therapy  Joint mobilization;Passive ROM    Joint Mobilization  Grade III patellar and tibiofemoral    Passive ROM  Knee flexion and extension ROM             PT Education - 05/02/19 1027    Education Details  HEP    Person(s) Educated  Patient    Methods  Explanation;Demonstration;Verbal cues     Comprehension  Verbalized understanding;Returned demonstration;Verbal cues required       PT Short Term Goals - 05/02/19 1028      PT SHORT TERM GOAL #1   Title  Patient will be independent and consistent with home exercise program in order to maintain progress in PT.    Baseline  Instructed on exercises on eval    Time  6    Period  Weeks    Status  Achieved    Target Date  05/05/19      PT SHORT TERM GOAL #2   Title  Patient will be able to walk community level distances without use of assistive device.    Baseline  Use of SPC    Time  6    Period  Weeks    Status  Achieved    Target Date  05/05/19      PT SHORT TERM GOAL #3   Title  Patient will exhibit left knee range of motion WFL to allow for normalized gait form.    Baseline  Limited knee flexion and extension    Time  5    Period  Weeks    Status  On-going    Target Date  06/06/19      PT SHORT TERM GOAL #4   Title  Patient will exhibit left knee strength of 5/5 to allow for safe return to work and ability to play with children with no pain or limitation.    Baseline  Strength 4+/5 left knee    Time  5    Period  Weeks    Status  On-going    Target Date  06/06/19      PT SHORT TERM GOAL #5   Title  Patient will display improvement in functional level to < or = 31% disability FOTO.    Baseline  47% disability    Time  6    Period  Weeks    Status  Achieved  25% limited   Target Date  05/05/19               Plan - 05/02/19 1043    Clinical Impression Statement  Patient is progressing well with his strength and activity, and he reports improvement in functional level based on FOTO score. He continues to lack full knee extension and flexion, and seems to have plateaued with range of motion. He was encouraged to continue stretching at home and working on progressing his activity tolerance. He would benefit from continued skilled PT to progress his motion and strength to allow for safe return to work.     Personal Factors and Comorbidities  Profession;Comorbidity 2    Comorbidities  BMI, HTN    Examination-Activity Limitations  Squat;Stairs;Stand;Locomotion Level;Lift    Examination-Participation Restrictions  Other   Work   Rehab Potential  Good    PT Frequency  1x / week    PT Duration  --   5 weeks   PT Treatment/Interventions  ADLs/Self Care Home Management;Cryotherapy;Electrical Stimulation;Iontophoresis 4mg /ml Dexamethasone;Moist Heat;Gait training;Stair training;Functional mobility training;Therapeutic activities;Therapeutic exercise;Balance training;Neuromuscular re-education;Patient/family education;Manual techniques;Scar mobilization;Passive range of motion;Dry needling;Taping;Joint Manipulations    PT Next Visit Plan  Improve knee flexion and extension range of motion through joint mob and stretching, progress strength    PT Home Exercise Plan  Stretching: supine knee flexion heel slides with sheet, knee flexion stretch with foot on step, standing knee flexion on step, seated hamstring stretch with pressure over knee; Strengthening: SLR, LAQ, standing hip abduction and extension with green band around knees, squat with counter support, heel raises, steps; Balance: single leg balance    Consulted and Agree with Plan of Care  Patient       Patient will benefit from skilled therapeutic intervention in order to improve the following deficits and impairments:  Abnormal gait, Decreased balance, Decreased endurance, Decreased mobility, Difficulty walking, Hypomobility, Improper body mechanics, Decreased range of motion, Decreased activity tolerance, Decreased strength, Pain  Visit Diagnosis: Chronic pain of left knee  Stiffness of left knee, not elsewhere classified  Muscle weakness (generalized)  Other abnormalities of gait and mobility     Problem List Patient Active Problem List   Diagnosis Date Noted  . Status post total left knee replacement 03/04/2019  . Unilateral  primary osteoarthritis, left knee 09/08/2017  . Unilateral primary osteoarthritis, right knee 09/08/2017    Hilda Blades, PT, DPT, LAT, ATC 05/02/19  10:50 AM Phone: [phone] Fax: [facility] [address] [place], [place], [address] Phone: [phone]   Fax:  [phone]  Name: Anthony Boyd MRN: [id number] Date of Birth: 30-Nov-1965

## 2019-05-10 DIAGNOSIS — H15113 Episcleritis periodica fugax, bilateral: Secondary | ICD-10-CM | POA: Diagnosis not present

## 2019-05-10 DIAGNOSIS — H16223 Keratoconjunctivitis sicca, not specified as Sjogren's, bilateral: Secondary | ICD-10-CM | POA: Diagnosis not present

## 2019-05-11 ENCOUNTER — Ambulatory Visit: Payer: BC Managed Care – PPO | Attending: Orthopaedic Surgery | Admitting: Physical Therapy

## 2019-05-11 ENCOUNTER — Other Ambulatory Visit: Payer: Self-pay

## 2019-05-11 ENCOUNTER — Encounter: Payer: Self-pay | Admitting: Physical Therapy

## 2019-05-11 DIAGNOSIS — M6281 Muscle weakness (generalized): Secondary | ICD-10-CM | POA: Insufficient documentation

## 2019-05-11 DIAGNOSIS — M25662 Stiffness of left knee, not elsewhere classified: Secondary | ICD-10-CM | POA: Insufficient documentation

## 2019-05-11 DIAGNOSIS — R2689 Other abnormalities of gait and mobility: Secondary | ICD-10-CM | POA: Insufficient documentation

## 2019-05-11 DIAGNOSIS — G8929 Other chronic pain: Secondary | ICD-10-CM

## 2019-05-11 DIAGNOSIS — M25562 Pain in left knee: Secondary | ICD-10-CM | POA: Diagnosis not present

## 2019-05-11 NOTE — Therapy (Signed)
[place], [place], [address] Phone: [phone]   Fax:  [phone]  Physical Therapy Treatment  Patient Details  Name: Anthony Boyd MRN: [id number] Date of Birth: [birth year] Referring Provider (PT): Mcarthur Rossetti, MD   Encounter Date: 05/11/2019  PT End of Session - 05/11/19 1055    Visit Number  9    Number of Visits  13    Date for PT Re-Evaluation  06/06/19    Authorization Type  BCBS    PT Start Time  8937    PT Stop Time  1130    PT Time Calculation (min)  39 min    Activity Tolerance  Patient tolerated treatment well    Behavior During Therapy  [facility] for tasks assessed/performed       Past Medical History:  Diagnosis Date  . Allergy   . 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 Hypertension   . Osteoporosis   . Rheumatoid arthritis(714.0)     Past Surgical History:  Procedure Laterality Date  . [place], 2001  . KNEE SURGERY  2003   left  . TOTAL KNEE ARTHROPLASTY Left 03/04/2019   Procedure: LEFT TOTAL KNEE ARTHROPLASTY;  Surgeon: Mcarthur Rossetti, MD;  Location: [facility];  Service: Orthopedics;  Laterality: Left;    There were no vitals filed for this visit.  Subjective Assessment - 05/11/19 1054    Subjective  Patient reports he is doing well. He has been doing a little walking with his work boots.    Currently in Pain?  No/denies         [facility] PT Assessment - 05/11/19 0001      ROM / Strength   AROM / PROM / Strength  AROM      AROM   AROM Assessment Site  Knee    Right/Left Knee  Left    Left Knee Extension  2    Left Knee Flexion  110                   [facility] Adult PT Treatment/Exercise - 05/11/19 0001      Exercises   Exercises  Knee/Hip      Knee/Hip Exercises: Stretches   Passive Hamstring Stretch  2 reps;30 seconds    Passive Hamstring Stretch Limitations  seated with pressure over knee    Quad Stretch  30 seconds;3 reps     Quad Stretch Limitations  prone with strap    Gastroc Stretch  2 reps;30 seconds    Gastroc Stretch Limitations  standing      Knee/Hip Exercises: Aerobic   Nustep  L6 x5 min LE only (seat 9)      Knee/Hip Exercises: Machines for Strengthening   Cybex Knee Extension  45# 2x15    Cybex Knee Flexion  45# 2x15    Cybex Leg Press  105# 2x10      Knee/Hip Exercises: Standing   Step Down  2 sets;10 reps    Step Down Limitations  lateral heel tap on 8" step      Manual Therapy   Manual Therapy  Joint mobilization;Passive ROM    Joint Mobilization  Grade III-IV patellar and tibiofemoral    Passive ROM  Knee flexion and extension ROM             PT Education - 05/11/19 1055    Education Details  HEP, progressing work related tasks  Person(s) Educated  Patient    Methods  Explanation;Demonstration;Verbal cues    Comprehension  Verbalized understanding;Returned demonstration;Verbal cues required;Need further instruction       PT Short Term Goals - 05/02/19 1028      PT SHORT TERM GOAL #1   Title  Patient will be independent and consistent with home exercise program in order to maintain progress in PT.    Baseline  Instructed on exercises on eval    Time  6    Period  Weeks    Status  Achieved    Target Date  05/05/19      PT SHORT TERM GOAL #2   Title  Patient will be able to walk community level distances without use of assistive device.    Baseline  Use of SPC    Time  6    Period  Weeks    Status  Achieved    Target Date  05/05/19      PT SHORT TERM GOAL #3   Title  Patient will exhibit left knee range of motion WFL to allow for normalized gait form.    Baseline  Limited knee flexion and extension    Time  5    Period  Weeks    Status  On-going    Target Date  06/06/19      PT SHORT TERM GOAL #4   Title  Patient will exhibit left knee strength of 5/5 to allow for safe return to work and ability to play with children with no pain or limitation.    Baseline   Strength 4+/5 left knee    Time  5    Period  Weeks    Status  On-going    Target Date  06/06/19      PT SHORT TERM GOAL #5   Title  Patient will display improvement in functional level to < or = 31% disability FOTO.    Baseline  47% disability    Time  6    Period  Weeks    Status  Achieved   25% limited   Target Date  05/05/19               Plan - 05/11/19 1115    Clinical Impression Statement  Patient is doing well and reporting less pain and stiffness on a daily basis. He continues to exhibit limited knee motion and he was encouraged to continue stretching at home. He was also encouaged to continue working on work tasks such as walking with steel toe boots and stairs. No change to HEP. He would benefit from continued skilled PT to progress his motion and strength in order to return to work safely.    PT Treatment/Interventions  ADLs/Self Care Home Management;Cryotherapy;Electrical Stimulation;Iontophoresis 4mg /ml Dexamethasone;Moist Heat;Gait training;Stair training;Functional mobility training;Therapeutic activities;Therapeutic exercise;Balance training;Neuromuscular re-education;Patient/family education;Manual techniques;Scar mobilization;Passive range of motion;Dry needling;Taping;Joint Manipulations    PT Next Visit Plan  Improve knee flexion and extension range of motion through joint mob and stretching, progress strength    PT Home Exercise Plan  Stretching: supine knee flexion heel slides with sheet, knee flexion stretch with foot on step, standing knee flexion on step, seated hamstring stretch with pressure over knee; Strengthening: SLR, LAQ, standing hip abduction and extension with green band around knees, squat with counter support, heel raises, steps; Balance: single leg balance    Consulted and Agree with Plan of Care  Patient       Patient will benefit from skilled therapeutic intervention  in order to improve the following deficits and impairments:  Abnormal gait,  Decreased balance, Decreased endurance, Decreased mobility, Difficulty walking, Hypomobility, Improper body mechanics, Decreased range of motion, Decreased activity tolerance, Decreased strength, Pain  Visit Diagnosis: Chronic pain of left knee  Stiffness of left knee, not elsewhere classified  Muscle weakness (generalized)  Other abnormalities of gait and mobility     Problem List Patient Active Problem List   Diagnosis Date Noted  . Status post total left knee replacement 03/04/2019  . Unilateral primary osteoarthritis, left knee 09/08/2017  . Unilateral primary osteoarthritis, right knee 09/08/2017    Rosana Hoes, PT, DPT, LAT, ATC 05/11/19  11:23 AM Phone: [phone] Fax: [phone]   [facility] [address] [place], [place], [address] Phone: [phone]   Fax:  [phone]  Name: Anthony Boyd MRN: [id number] Date of Birth: 31-Mar-1966

## 2019-05-16 ENCOUNTER — Telehealth: Payer: Self-pay | Admitting: Orthopaedic Surgery

## 2019-05-16 NOTE — Telephone Encounter (Signed)
Patient called requesting medical records to be sent to [facility] Disability for claim. Patient states disability needs medical records from patient surgery 03/04/2019. Disability fax number for medical records is [phone]. Disability land line is [phone]. Patient also requested call back from Dr. Ninfa Linden office about medical records. Patient phone number is  [phone].

## 2019-05-17 NOTE — Telephone Encounter (Signed)
I spoke with patient. He is to come in to office today to complete release

## 2019-05-18 ENCOUNTER — Ambulatory Visit: Payer: BC Managed Care – PPO | Admitting: Physical Therapy

## 2019-05-18 ENCOUNTER — Other Ambulatory Visit: Payer: Self-pay

## 2019-05-18 ENCOUNTER — Encounter: Payer: Self-pay | Admitting: Physical Therapy

## 2019-05-18 DIAGNOSIS — M6281 Muscle weakness (generalized): Secondary | ICD-10-CM

## 2019-05-18 DIAGNOSIS — R2689 Other abnormalities of gait and mobility: Secondary | ICD-10-CM

## 2019-05-18 DIAGNOSIS — M25562 Pain in left knee: Secondary | ICD-10-CM

## 2019-05-18 DIAGNOSIS — M25662 Stiffness of left knee, not elsewhere classified: Secondary | ICD-10-CM

## 2019-05-18 DIAGNOSIS — G8929 Other chronic pain: Secondary | ICD-10-CM

## 2019-05-18 NOTE — Therapy (Signed)
[place], [place], [address] Phone: [phone]   Fax:  [phone]  Physical Therapy Treatment  Patient Details  Name: SAUD BAIL MRN: [id number] Date of Birth: [birth year] Referring Provider (PT): Mcarthur Rossetti, MD   Encounter Date: 05/18/2019  PT End of Session - 05/18/19 1025    Visit Number  10    Number of Visits  13    Date for PT Re-Evaluation  06/06/19    Authorization Type  BCBS    PT Start Time  1013   patient arrived late   PT Stop Time  1045    PT Time Calculation (min)  32 min    Activity Tolerance  Patient tolerated treatment well    Behavior During Therapy  [facility] for tasks assessed/performed       Past Medical History:  Diagnosis Date  . Allergy   . 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 Hypertension   . Osteoporosis   . Rheumatoid arthritis(714.0)     Past Surgical History:  Procedure Laterality Date  . [place], 2001  . KNEE SURGERY  2003   left  . TOTAL KNEE ARTHROPLASTY Left 03/04/2019   Procedure: LEFT TOTAL KNEE ARTHROPLASTY;  Surgeon: Mcarthur Rossetti, MD;  Location: [facility];  Service: Orthopedics;  Laterality: Left;    There were no vitals filed for this visit.  Subjective Assessment - 05/18/19 1020    Subjective  Patient reports he is doing well. Nothing new. He is still progressing with his work tasks at home.    Currently in Pain?  No/denies         [facility] PT Assessment - 05/18/19 0001      ROM / Strength   AROM / PROM / Strength  AROM      AROM   AROM Assessment Site  Knee    Right/Left Knee  Left    Left Knee Extension  2    Left Knee Flexion  110      Strength   Strength Assessment Site  Hip    Right/Left Hip  Right;Left    Right Hip Flexion  4+/5    Right Hip ABduction  4/5    Left Hip Flexion  4+/5    Left Hip ABduction  4/5    Right Knee Flexion  5/5    Right Knee Extension  5/5    Left Knee Flexion  5/5    Left Knee Extension  5/5                   [facility] Adult PT Treatment/Exercise - 05/18/19 0001      Exercises   Exercises  Knee/Hip      Knee/Hip Exercises: Aerobic   Nustep  L6 x3 min LE only (seat 9)      Knee/Hip Exercises: Machines for Strengthening   Cybex Knee Extension  45# 2x15    Cybex Knee Flexion  45# 2x15    Cybex Leg Press  115# 2x15      Knee/Hip Exercises: Standing   Forward Step Up  2 sets;15 reps    Forward Step Up Limitations  8", no UE support    Step Down  2 sets;10 reps    Step Down Limitations  lateral heel tap on 8" step             PT Education - 05/18/19 1025    Education Details  HEP,  progressing work related tasks at home    [employer]) Educated  Patient    Methods  Explanation;Demonstration    Comprehension  Verbalized understanding;Returned demonstration       PT Short Term Goals - 05/18/19 1140      PT SHORT TERM GOAL #1   Title  Patient will be independent and consistent with home exercise program in order to maintain progress in PT.    Baseline  Instructed on exercises on eval    Time  6    Period  Weeks    Status  Achieved    Target Date  05/05/19      PT SHORT TERM GOAL #2   Title  Patient will be able to walk community level distances without use of assistive device.    Baseline  Use of SPC    Time  6    Period  Weeks    Status  Achieved    Target Date  05/05/19      PT SHORT TERM GOAL #3   Title  Patient will exhibit left knee range of motion WFL to allow for normalized gait form.    Baseline  Limited knee flexion and extension    Time  5    Period  Weeks    Status  On-going    Target Date  06/06/19      PT SHORT TERM GOAL #4   Title  Patient will exhibit left knee strength of 5/5 to allow for safe return to work and ability to play with children with no pain or limitation.    Baseline  Strength 4+/5 left knee    Time  5    Period  Weeks    Status  Achieved    Target Date  06/06/19      PT SHORT TERM  GOAL #5   Title  Patient will display improvement in functional level to < or = 31% disability FOTO.    Baseline  47% disability    Time  6    Period  Weeks    Status  Achieved   25% limited   Target Date  05/05/19               Plan - 05/18/19 1138    Clinical Impression Statement  Patient exhibits improved strength but continues to exhibit limited knee flexion range of motion. He also seems to be improving with his activity tolerance with work tasks at home. He tolerated therapy well this visit with no increased pain. He would benefit from continued skilled PT to progress his activity tolerance so he will have no limitation with work.    PT Treatment/Interventions  ADLs/Self Care Home Management;Cryotherapy;Electrical Stimulation;Iontophoresis 4mg /ml Dexamethasone;Moist Heat;Gait training;Stair training;Functional mobility training;Therapeutic activities;Therapeutic exercise;Balance training;Neuromuscular re-education;Patient/family education;Manual techniques;Scar mobilization;Passive range of motion;Dry needling;Taping;Joint Manipulations    PT Next Visit Plan  Progress strength and activity tolerance, improve knee flexion and extension range of motion through joint mob and stretching    PT Home Exercise Plan  Stretching: supine knee flexion heel slides with sheet, knee flexion stretch with foot on step, standing knee flexion on step, seated hamstring stretch with pressure over knee; Strengthening: SLR, LAQ, standing hip abduction and extension with green band around knees, squat with counter support, heel raises, steps; Balance: single leg balance    Consulted and Agree with Plan of Care  Patient       Patient will benefit from skilled therapeutic intervention in order to improve the following deficits  and impairments:  Abnormal gait, Decreased balance, Decreased endurance, Decreased mobility, Difficulty walking, Hypomobility, Improper body mechanics, Decreased range of motion,  Decreased activity tolerance, Decreased strength, Pain  Visit Diagnosis: Chronic pain of left knee  Stiffness of left knee, not elsewhere classified  Muscle weakness (generalized)  Other abnormalities of gait and mobility     Problem List Patient Active Problem List   Diagnosis Date Noted  . Status post total left knee replacement 03/04/2019  . Unilateral primary osteoarthritis, left knee 09/08/2017  . Unilateral primary osteoarthritis, right knee 09/08/2017    Rosana Hoes, PT, DPT, LAT, ATC 05/18/19  11:48 AM Phone: [phone] Fax: [phone]   [facility] [address] [place], [place], [address] Phone: [phone]   Fax:  [phone]  Name: ELEFTHERIOS DUDENHOEFFER MRN: [id number] Date of Birth: Jun 14, 1965

## 2019-05-19 ENCOUNTER — Ambulatory Visit (INDEPENDENT_AMBULATORY_CARE_PROVIDER_SITE_OTHER): Payer: BC Managed Care – PPO | Admitting: Orthopaedic Surgery

## 2019-05-19 ENCOUNTER — Encounter: Payer: Self-pay | Admitting: Orthopaedic Surgery

## 2019-05-19 DIAGNOSIS — Z96652 Presence of left artificial knee joint: Secondary | ICD-10-CM

## 2019-05-19 MED ORDER — METHOCARBAMOL 500 MG PO TABS: 500.0000 mg | ORAL_TABLET | Freq: Four times a day (QID) | ORAL | 1 refills | Status: DC | PRN

## 2019-05-19 MED ORDER — HYDROCODONE-ACETAMINOPHEN 5-325 MG PO TABS: 1.0000 | ORAL_TABLET | Freq: Four times a day (QID) | ORAL | 0 refills | Status: DC | PRN

## 2019-05-19 NOTE — Progress Notes (Signed)
The patient is now 11 weeks tomorrow status post a left total knee arthroplasty.  He is working well in terms of his range of motion and strength.  He has 2 more physical therapy visits.  He is scheduled to return to work January 4.  I agree with this because this will maximize his ability to get his knee having the best function as possible as well as waiting for pain medication.  He does need a refill of these and I did refill them today.  On examination his left knee incision looks great.  His extension is full and his flexion is about 115 degrees.  I think some of his limitations are related just to the swelling but overall the knee feels ligamentously stable and he should be commended for how much he is pushing himself.  It only 53 years old this is a very painful surgery.  All question concerns were answered and addressed.  He can return to work starting after the first of the year.  We will see him back in 3 months unless there are any issues.  At that visit I would like a standing AP and lateral of his left knee.

## 2019-05-25 ENCOUNTER — Ambulatory Visit: Payer: BC Managed Care – PPO | Admitting: Physical Therapy

## 2019-05-25 ENCOUNTER — Encounter: Payer: Self-pay | Admitting: Physical Therapy

## 2019-05-25 ENCOUNTER — Other Ambulatory Visit: Payer: Self-pay

## 2019-05-25 DIAGNOSIS — M6281 Muscle weakness (generalized): Secondary | ICD-10-CM | POA: Diagnosis not present

## 2019-05-25 DIAGNOSIS — G8929 Other chronic pain: Secondary | ICD-10-CM

## 2019-05-25 DIAGNOSIS — M25662 Stiffness of left knee, not elsewhere classified: Secondary | ICD-10-CM | POA: Diagnosis not present

## 2019-05-25 DIAGNOSIS — M25562 Pain in left knee: Secondary | ICD-10-CM

## 2019-05-25 DIAGNOSIS — R2689 Other abnormalities of gait and mobility: Secondary | ICD-10-CM | POA: Diagnosis not present

## 2019-05-25 NOTE — Therapy (Signed)
[facility] [address] [place], [place], [address] Phone: [phone]   Fax:  [phone]  Physical Therapy Treatment  Patient Details  Name: Anthony Boyd MRN: [id number] Date of Birth: [birth year] Referring Provider (PT): Kathryne Hitch, MD   Encounter Date: 05/25/2019  PT End of Session - 05/25/19 1011    Visit Number  11    Number of Visits  13    Date for PT Re-Evaluation  06/06/19    Authorization Type  BCBS    PT Start Time  1007    PT Stop Time  1045    PT Time Calculation (min)  38 min    Activity Tolerance  Patient tolerated treatment well    Behavior During Therapy  [facility] for tasks assessed/performed       Past Medical History:  Diagnosis Date  . Allergy   . 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 Hypertension   . Osteoporosis   . Rheumatoid arthritis(714.0)     Past Surgical History:  Procedure Laterality Date  . FOOT SURGERY  1998, 2001  . KNEE SURGERY  2003   left  . TOTAL KNEE ARTHROPLASTY Left 03/04/2019   Procedure: LEFT TOTAL KNEE ARTHROPLASTY;  Surgeon: Kathryne Hitch, MD;  Location: [facility];  Service: Orthopedics;  Laterality: Left;    There were no vitals filed for this visit.  Subjective Assessment - 05/25/19 1010    Subjective  Patient reports he is doing well. He saw his doctor this past week and he was cleared to return to work in the new year.    Currently in Pain?  No/denies         [facility] PT Assessment - 05/25/19 0001      AROM   AROM Assessment Site  Knee    Right/Left Knee  Left    Left Knee Extension  2    Left Knee Flexion  112                   [facility] Adult PT Treatment/Exercise - 05/25/19 0001      Exercises   Exercises  Knee/Hip      Knee/Hip Exercises: Stretches   Passive Hamstring Stretch  2 reps;30 seconds    Passive Hamstring Stretch Limitations  seated with pressure over knee    Quad Stretch  2 reps;30 seconds    Quad Stretch  Limitations  prone with strap    Gastroc Stretch  2 reps;30 seconds    Gastroc Stretch Limitations  standing      Knee/Hip Exercises: Aerobic   Nustep  L6 x5 min LE only (seat 9)      Knee/Hip Exercises: Machines for Strengthening   Cybex Knee Extension  45# 3x15    Cybex Knee Flexion  45# 3x15    Cybex Leg Press  125# 3x15      Knee/Hip Exercises: Standing   Heel Raises  2 sets;20 reps    Hip Flexion  2 sets;10 reps    Hip Flexion Limitations  marching 4#, no use of UE    Hip Abduction  2 sets;10 reps    Abduction Limitations  4#    Hip Extension  2 sets;10 reps    Extension Limitations  4#    Forward Step Up  2 sets;10 reps    Forward Step Up Limitations  10" step, unilateral UE support    SLS  2x30 sec      Knee/Hip  Exercises: Seated   Sit to Sand  2 sets;10 reps;without UE support             PT Education - 05/25/19 1011    Education Details  HEP    Person(s) Educated  Patient    Methods  Explanation;Demonstration    Comprehension  Verbalized understanding;Returned demonstration       PT Short Term Goals - 05/18/19 1140      PT SHORT TERM GOAL #1   Title  Patient will be independent and consistent with home exercise program in order to maintain progress in PT.    Baseline  Instructed on exercises on eval    Time  6    Period  Weeks    Status  Achieved    Target Date  05/05/19      PT SHORT TERM GOAL #2   Title  Patient will be able to walk community level distances without use of assistive device.    Baseline  Use of SPC    Time  6    Period  Weeks    Status  Achieved    Target Date  05/05/19      PT SHORT TERM GOAL #3   Title  Patient will exhibit left knee range of motion WFL to allow for normalized gait form.    Baseline  Limited knee flexion and extension    Time  5    Period  Weeks    Status  On-going    Target Date  06/06/19      PT SHORT TERM GOAL #4   Title  Patient will exhibit left knee strength of 5/5 to allow for safe return to  work and ability to play with children with no pain or limitation.    Baseline  Strength 4+/5 left knee    Time  5    Period  Weeks    Status  Achieved    Target Date  06/06/19      PT SHORT TERM GOAL #5   Title  Patient will display improvement in functional level to < or = 31% disability FOTO.    Baseline  47% disability    Time  6    Period  Weeks    Status  Achieved   25% limited   Target Date  05/05/19               Plan - 05/25/19 1012    Clinical Impression Statement  Patient continues progress with his strengthening exercises and activity tolerance. His knee flexion is limited but this will most likely continue to improve over the coming months. He would benefit from continued skilled PT to continue strength progression and improve activity tolerance for his return to work.    PT Treatment/Interventions  ADLs/Self Care Home Management;Cryotherapy;Electrical Stimulation;Iontophoresis 4mg /ml Dexamethasone;Moist Heat;Gait training;Stair training;Functional mobility training;Therapeutic activities;Therapeutic exercise;Balance training;Neuromuscular re-education;Patient/family education;Manual techniques;Scar mobilization;Passive range of motion;Dry needling;Taping;Joint Manipulations    PT Next Visit Plan  Progress strength and activity tolerance, improve knee flexion and extension range of motion through joint mob and stretching    PT Home Exercise Plan  Stretching: supine knee flexion heel slides with sheet, knee flexion stretch with foot on step, standing knee flexion on step, seated hamstring stretch with pressure over knee; Strengthening: SLR, LAQ, standing hip abduction and extension with green band around knees, squat with counter support, heel raises, steps; Balance: single leg balance    Consulted and Agree with Plan of Care  Patient  Patient will benefit from skilled therapeutic intervention in order to improve the following deficits and impairments:  Abnormal  gait, Decreased balance, Decreased endurance, Decreased mobility, Difficulty walking, Hypomobility, Improper body mechanics, Decreased range of motion, Decreased activity tolerance, Decreased strength, Pain  Visit Diagnosis: Chronic pain of left knee  Stiffness of left knee, not elsewhere classified  Muscle weakness (generalized)  Other abnormalities of gait and mobility     Problem List Patient Active Problem List   Diagnosis Date Noted  . Status post total left knee replacement 03/04/2019  . Unilateral primary osteoarthritis, left knee 09/08/2017  . Unilateral primary osteoarthritis, right knee 09/08/2017    Hilda Blades, PT, DPT, LAT, ATC 05/25/19  10:45 AM Phone: [phone] Fax: [facility] [address] [place], [place], [address] Phone: [phone]   Fax:  [phone]  Name: TRYSTIN TERHUNE MRN: [id number] Date of Birth: 01-29-1966

## 2019-06-01 ENCOUNTER — Ambulatory Visit: Payer: BC Managed Care – PPO | Admitting: Physical Therapy

## 2019-06-01 ENCOUNTER — Encounter: Payer: Self-pay | Admitting: Physical Therapy

## 2019-06-01 ENCOUNTER — Other Ambulatory Visit: Payer: Self-pay

## 2019-06-01 DIAGNOSIS — R2689 Other abnormalities of gait and mobility: Secondary | ICD-10-CM | POA: Diagnosis not present

## 2019-06-01 DIAGNOSIS — G8929 Other chronic pain: Secondary | ICD-10-CM | POA: Diagnosis not present

## 2019-06-01 DIAGNOSIS — M6281 Muscle weakness (generalized): Secondary | ICD-10-CM

## 2019-06-01 DIAGNOSIS — M25562 Pain in left knee: Secondary | ICD-10-CM | POA: Diagnosis not present

## 2019-06-01 DIAGNOSIS — M25662 Stiffness of left knee, not elsewhere classified: Secondary | ICD-10-CM

## 2019-06-01 NOTE — Therapy (Addendum)
[place], [place], [address] Phone: [phone]   Fax:  [phone]  Physical Therapy Treatment and Discharge  Patient Details  Name: Anthony Boyd MRN: [id number] Date of Birth: 23-Jan-1966 Referring Provider (PT): Mcarthur Rossetti, MD   Encounter Date: 06/01/2019  PT End of Session - 06/01/19 1034    Visit Number  12    Authorization Type  BC[employer]    PT Start Time  1010   patient arrived late   PT Stop Time  1045    PT Time Calculation (min)  35 min    Activity Tolerance  Patient tolerated treatment well    Behavior During Therapy  [facility] for tasks assessed/performed       Past Medical History:  Diagnosis Date  . Allergy   . 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 Hypertension   . Osteoporosis   . Rheumatoid arthritis(714.0)     Past Surgical History:  Procedure Laterality Date  . [place], 2001  . KNEE SURGERY  2003   left  . TOTAL KNEE ARTHROPLASTY Left 03/04/2019   Procedure: LEFT TOTAL KNEE ARTHROPLASTY;  Surgeon: Mcarthur Rossetti, MD;  Location: [facility];  Service: Orthopedics;  Laterality: Left;    There were no vitals filed for this visit.  Subjective Assessment - 06/01/19 1032    Subjective  Patient reports he is doing well and denies any pain. He feels confident he is ready to discharge and is independent with all exercises at home.    How long can you sit comfortably?  Unlimited    How long can you stand comfortably?  Unlimited    How long can you walk comfortably?  Unlimited    Currently in Pain?  No/denies         [facility] PT Assessment - 06/01/19 0001      Assessment   Medical Diagnosis  s/p Left TKA, right knee pain, right knee stiffness, right leg weakness    Referring Provider (PT)  Mcarthur Rossetti, MD    Onset Date/Surgical Date  03/04/19      AROM   Left Knee Extension  2    Left Knee Flexion  112      Strength   Overall Strength  Comments  Grossly 5/5 MMT throughout      Transfers   Transfers  Independent with all Transfers      Ambulation/Gait   Ambulation/Gait  Yes    Ambulation/Gait Assistance  7: Independent    Gait Pattern  Within Functional Limits                   [facility] Adult PT Treatment/Exercise - 06/01/19 0001      Exercises   Exercises  Knee/Hip      Knee/Hip Exercises: Stretches   Passive Hamstring Stretch  2 reps;30 seconds    Passive Hamstring Stretch Limitations  seated with pressure over knee    Quad Stretch  2 reps;30 seconds    Quad Stretch Limitations  prone with strap    Gastroc Stretch  2 reps;30 seconds    Gastroc Stretch Limitations  standing      Knee/Hip Exercises: Aerobic   Nustep  L6 x5 min LE only (seat 9)      Knee/Hip Exercises: Machines for Strengthening   Cybex Knee Extension  45# 3x15    Cybex Knee Flexion  45# 3x15    Cybex Leg Press  125# 3x15      Knee/Hip Exercises: Standing   Hip Flexion  2 sets;15 reps    Hip Flexion Limitations  marching 5#, no use of UE    Hip Abduction  2 sets;15 reps    Abduction Limitations  5#    Hip Extension  2 sets;15 reps    Extension Limitations  5#    Forward Step Up  2 sets;15 reps    Forward Step Up Limitations  10" step, unilateral UE support             PT Education - 06/01/19 1032    Education Details  HEP    Person(s) Educated  Patient    Methods  Explanation    Comprehension  Verbalized understanding       PT Short Term Goals - 06/01/19 1037      PT SHORT TERM GOAL #1   Title  Patient will be independent and consistent with home exercise program in order to maintain progress in PT.    Baseline  Instructed on exercises on eval    Time  6    Period  Weeks    Status  Achieved    Target Date  05/05/19      PT SHORT TERM GOAL #2   Title  Patient will be able to walk community level distances without use of assistive device.    Baseline  Use of SPC    Time  6    Period  Weeks    Status   Achieved    Target Date  05/05/19      PT SHORT TERM GOAL #3   Title  Patient will exhibit left knee range of motion WFL to allow for normalized gait form.    Baseline  Limited knee flexion and extension    Time  5    Period  Weeks    Status  Partially Met    Target Date  06/06/19      PT SHORT TERM GOAL #4   Title  Patient will exhibit left knee strength of 5/5 to allow for safe return to work and ability to play with children with no pain or limitation.    Baseline  Strength 4+/5 left knee    Time  5    Period  Weeks    Status  Achieved    Target Date  06/06/19      PT SHORT TERM GOAL #5   Title  Patient will display improvement in functional level to < or = 31% disability FOTO.    Baseline  47% disability    Time  6    Period  Weeks    Status  Achieved   25% limited   Target Date  05/05/19               Plan - 06/01/19 1035    Clinical Impression Statement  Patient is doing well and is independent with all exercises and is ready to return to work. Skilled PT is no longer indicated.    PT Next Visit Plan  NA    PT Home Exercise Plan  Stretching: supine knee flexion heel slides with sheet, knee flexion stretch with foot on step, standing knee flexion on step, seated hamstring stretch with pressure over knee; Strengthening: SLR, LAQ, standing hip abduction and extension with green band around knees, squat with counter support, heel raises, steps; Balance: single leg balance    Consulted and Agree with Plan of Care  Patient       Patient will benefit from skilled therapeutic intervention in order to improve the following deficits and impairments:     Visit Diagnosis: Chronic pain of left knee  Stiffness of left knee, not elsewhere classified  Muscle weakness (generalized)  Other abnormalities of gait and mobility     Problem List Patient Active Problem List   Diagnosis Date Noted  . Status post total left knee replacement 03/04/2019  . Unilateral primary  osteoarthritis, left knee 09/08/2017  . Unilateral primary osteoarthritis, right knee 09/08/2017    Hilda Blades, PT, DPT, LAT, ATC 06/01/19  10:52 AM Phone: [phone] Fax: [facility] [address] [place], [place], [address] Phone: [phone]   Fax:  [phone]  Name: Anthony Boyd MRN: [id number] Date of Birth: [birth year]   PHYSICAL THERAPY DISCHARGE SUMMARY  Visits from Start of Care: 12  Current functional level related to goals / functional outcomes: Patient is fully functional and independent   Remaining deficits: Patient continues to exhibit limited knee flexion ROM    Education / Equipment: HEP Plan: Patient agrees to discharge.  Patient goals were partially met. Patient is being discharged due to being pleased with the current functional level.  ?????

## 2019-06-09 DIAGNOSIS — M1712 Unilateral primary osteoarthritis, left knee: Secondary | ICD-10-CM | POA: Diagnosis not present

## 2019-06-09 DIAGNOSIS — M0579 Rheumatoid arthritis with rheumatoid factor of multiple sites without organ or systems involvement: Secondary | ICD-10-CM | POA: Diagnosis not present

## 2019-06-09 DIAGNOSIS — Z79899 Other long term (current) drug therapy: Secondary | ICD-10-CM | POA: Diagnosis not present

## 2019-06-14 ENCOUNTER — Telehealth: Payer: Self-pay | Admitting: Orthopaedic Surgery

## 2019-06-14 NOTE — Telephone Encounter (Signed)
Patient called stated he returned to work on 12/2 But HR needs a note saying he is released and can return without retractions.  Please cal patient [phone]

## 2019-06-14 NOTE — Telephone Encounter (Signed)
LMOM for patient letting him know I put note at the front desk unless he calls back with a fax number

## 2019-08-05 DIAGNOSIS — M0579 Rheumatoid arthritis with rheumatoid factor of multiple sites without organ or systems involvement: Secondary | ICD-10-CM | POA: Diagnosis not present

## 2019-08-05 DIAGNOSIS — M1712 Unilateral primary osteoarthritis, left knee: Secondary | ICD-10-CM | POA: Diagnosis not present

## 2019-08-05 DIAGNOSIS — R7303 Prediabetes: Secondary | ICD-10-CM | POA: Diagnosis not present

## 2019-08-05 DIAGNOSIS — M79641 Pain in right hand: Secondary | ICD-10-CM | POA: Diagnosis not present

## 2019-08-05 DIAGNOSIS — Z0000 Encounter for general adult medical examination without abnormal findings: Secondary | ICD-10-CM | POA: Diagnosis not present

## 2019-08-05 DIAGNOSIS — Z79899 Other long term (current) drug therapy: Secondary | ICD-10-CM | POA: Diagnosis not present

## 2019-08-05 DIAGNOSIS — I10 Essential (primary) hypertension: Secondary | ICD-10-CM | POA: Diagnosis not present

## 2019-08-05 DIAGNOSIS — Z125 Encounter for screening for malignant neoplasm of prostate: Secondary | ICD-10-CM | POA: Diagnosis not present

## 2019-08-05 DIAGNOSIS — M79642 Pain in left hand: Secondary | ICD-10-CM | POA: Diagnosis not present

## 2019-08-05 DIAGNOSIS — M0689 Other specified rheumatoid arthritis, multiple sites: Secondary | ICD-10-CM | POA: Diagnosis not present

## 2019-08-15 ENCOUNTER — Ambulatory Visit (INDEPENDENT_AMBULATORY_CARE_PROVIDER_SITE_OTHER): Payer: BC Managed Care – PPO

## 2019-08-15 ENCOUNTER — Encounter: Payer: Self-pay | Admitting: Podiatry

## 2019-08-15 ENCOUNTER — Ambulatory Visit: Payer: BLUE CROSS/BLUE SHIELD | Admitting: Podiatry

## 2019-08-15 ENCOUNTER — Other Ambulatory Visit: Payer: Self-pay

## 2019-08-15 VITALS — BP 160/87 | HR 79 | Temp 98.2°F

## 2019-08-15 DIAGNOSIS — M76821 Posterior tibial tendinitis, right leg: Secondary | ICD-10-CM

## 2019-08-15 DIAGNOSIS — M25571 Pain in right ankle and joints of right foot: Secondary | ICD-10-CM

## 2019-08-17 ENCOUNTER — Ambulatory Visit: Payer: BC Managed Care – PPO | Admitting: Orthopaedic Surgery

## 2019-08-18 NOTE — Progress Notes (Signed)
Subjective:   Patient ID: Anthony Boyd, male   DOB: 54 y.o.   MRN: [id number]   HPI Patient presents stating he has had developed pain in his right ankle several months duration he is quite active and states he does have flatfoot deformity and it makes it hard to be active and it does become painful with activity.  Patient does not smoke likes to be active   Review of Systems  All other systems reviewed and are negative.       Objective:  Physical Exam Vitals and nursing note reviewed.  Constitutional:      Appearance: He is well-developed.  Pulmonary:     Effort: Pulmonary effort is normal.  Musculoskeletal:        General: Normal range of motion.  Skin:    General: Skin is warm.  Neurological:     Mental Status: He is alert.     Neurovascular status intact with patient found to have normal muscle strength including posterior tib bilateral and is noted to have good digital perfusion bilateral.  I noted there to be adequate range of motion with patient having inflammation pain of the posterior tibial tendon as it comes under the medial malleolus right with flatfoot deformity noted.  Post     Assessment:  Tibial tendinitis right with inflammation fluid of the tendon complex with flatfoot deformity     Plan:  H&P x-ray reviewed and today I did careful sheath injection 3 mg Dexasone Kenalog 5 mg Xylocaine I applied fascial brace to lift up the arch instructed on supportive shoes and placed on oral anti-inflammatory and reappoint to recheck again in the next several weeks  X-rays indicate that there is moderate depression of the arch with no indication of excessive flatfoot deformity right

## 2019-08-23 ENCOUNTER — Other Ambulatory Visit: Payer: Self-pay | Admitting: Podiatry

## 2019-08-23 DIAGNOSIS — M76821 Posterior tibial tendinitis, right leg: Secondary | ICD-10-CM

## 2019-08-24 ENCOUNTER — Ambulatory Visit: Payer: BC Managed Care – PPO | Admitting: Orthopaedic Surgery

## 2019-08-25 DIAGNOSIS — R7303 Prediabetes: Secondary | ICD-10-CM | POA: Diagnosis not present

## 2019-08-25 DIAGNOSIS — I10 Essential (primary) hypertension: Secondary | ICD-10-CM | POA: Diagnosis not present

## 2019-08-25 DIAGNOSIS — Z0000 Encounter for general adult medical examination without abnormal findings: Secondary | ICD-10-CM | POA: Diagnosis not present

## 2019-08-30 DIAGNOSIS — M0579 Rheumatoid arthritis with rheumatoid factor of multiple sites without organ or systems involvement: Secondary | ICD-10-CM | POA: Diagnosis not present

## 2019-08-30 DIAGNOSIS — Z79899 Other long term (current) drug therapy: Secondary | ICD-10-CM | POA: Diagnosis not present

## 2019-09-02 ENCOUNTER — Ambulatory Visit: Payer: BC Managed Care – PPO | Admitting: Podiatry

## 2019-09-02 ENCOUNTER — Encounter: Payer: Self-pay | Admitting: Podiatry

## 2019-09-02 ENCOUNTER — Other Ambulatory Visit: Payer: Self-pay

## 2019-09-02 DIAGNOSIS — M76822 Posterior tibial tendinitis, left leg: Secondary | ICD-10-CM

## 2019-09-02 DIAGNOSIS — M25571 Pain in right ankle and joints of right foot: Secondary | ICD-10-CM

## 2019-09-02 DIAGNOSIS — M76821 Posterior tibial tendinitis, right leg: Secondary | ICD-10-CM

## 2019-09-05 NOTE — Progress Notes (Signed)
Subjective:   Patient ID: Tomma Rakers, male   DOB: 54 y.o.   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   ROS      Objective:  Physical Exam  Patient is found to have continued inflammation of the posterior tibial tendon as it comes underneath the medial malleolus right with depression of the arch collapse of medial longitudinal arch noted right over low.  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  Today I went ahead and I did careful sheath injection and applied cam walker with instructions on usage with possibility we will need to take him off of work for several weeks to let this rest.  I then recommended orthotics and patient is scanned for customized orthotic to lift up the medial arch and build a deep heel seat.  Patient will be seen back when those are ready and to be reevaluated

## 2019-09-13 ENCOUNTER — Telehealth: Payer: Self-pay | Admitting: Podiatry

## 2019-09-13 ENCOUNTER — Telehealth: Payer: Self-pay | Admitting: Orthopaedic Surgery

## 2019-09-13 NOTE — Telephone Encounter (Signed)
Pt called stating he needs a note for work letting them know he won't be there 09/14/19-09/15/19 due to him not feeling well. Pt would like a call back whenever this is ready for pick up.  [phone]

## 2019-09-13 NOTE — Telephone Encounter (Signed)
Pt called stating that he is having some pain and siscomfort from being in his boot for 12 hrs at work wanted a note out of work for 09/14/19 -09/15/19 unless Dr.Regal wants him out longer

## 2019-09-14 ENCOUNTER — Encounter: Payer: Self-pay | Admitting: Orthopaedic Surgery

## 2019-09-14 ENCOUNTER — Ambulatory Visit: Payer: Self-pay

## 2019-09-14 ENCOUNTER — Ambulatory Visit: Payer: BC Managed Care – PPO | Admitting: Orthopaedic Surgery

## 2019-09-14 ENCOUNTER — Other Ambulatory Visit: Payer: Self-pay

## 2019-09-14 DIAGNOSIS — Z96652 Presence of left artificial knee joint: Secondary | ICD-10-CM | POA: Diagnosis not present

## 2019-09-14 DIAGNOSIS — M1711 Unilateral primary osteoarthritis, right knee: Secondary | ICD-10-CM

## 2019-09-14 MED ORDER — HYDROCODONE-ACETAMINOPHEN 5-325 MG PO TABS: 1.0000 | ORAL_TABLET | Freq: Four times a day (QID) | ORAL | 0 refills | Status: DC | PRN

## 2019-09-14 NOTE — Progress Notes (Signed)
+   Office Visit Note   Patient: Anthony Boyd           Date of Birth: 11/27/1965           MRN: [id number] Visit Date: 09/14/2019              Requested by: Merrilee Seashore, [address] [place],  [place] [address] PCP: Merrilee Seashore, MD   Assessment & Plan: Visit Diagnoses:  1. Status post total left knee replacement   2. Unilateral primary osteoarthritis, right knee     Plan: I gave him encouragement that he is doing very well from his left total knee arthroplasty.  At some point he may want to consider this for his right knee given the severity of his arthritis in his right knee.  He is requesting hydrocodone.  I told him I would fill this only this 1 last time and we need him off of narcotics after that.  From my standpoint, I do not need to see him back for 6 months unless he is having any issues.  We can have an AP and lateral of both knees at that visit.  Obviously if his right knee worsens before then and he needs treatment he will let us know.  Follow-Up Instructions: Return in about 6 months (around 03/15/2020).   Orders:  Orders Placed This Encounter  Procedures  . XR Knee 1-2 Views Left   Meds ordered this encounter  Medications  . HYDROcodone-acetaminophen (NORCO/VICODIN) 5-325 MG tablet    Sig: Take 1-2 tablets by mouth every 6 (six) hours as needed for moderate pain.    Dispense:  40 tablet    Refill:  0      Procedures: No procedures performed   Clinical Data: No additional findings.   Subjective: Chief Complaint  Patient presents with  . Left Knee - Routine Post Op  The patient is now 6 months status post a left total knee arthroplasty.  He says the knee is doing very well overall.  He reports increased range of motion and strength.  He is actually in a cam walking boot on his right ankle due to severe inflammation around the tendons in the joint.  He is seen by Dr. Paulla Dolly for this.  He does have known severe arthritis in  his right knee and this is certainly affecting his gait but he understands that it is appropriate for him to be in acute like this to help decrease the symptoms that he is having with his ankle.  He has had no other acute changes in medical status.  HPI  Review of Systems He currently denies any headache, chest pain, shortness of breath, fever, chills, nausea, vomiting  Objective: Vital Signs: There were no vitals taken for this visit.  Physical Exam He is alert and oriented x3 and in no acute distress Ortho Exam Examination of his left operative knee shows a well-healed surgical incision.  His flexion is full and his extension is full.  The knee is ligamentously stable.  His right knee does show varus malalignment and significant stiffness throughout his arc of motion.  There is patellofemoral crepitation and medial joint line tenderness. Specialty Comments:  No specialty comments available.  Imaging: XR Knee 1-2 Views Left  Result Date: 09/14/2019 2 views of the left knee show a total knee arthroplasty on the left side with no complicating features.  There is no evidence of loosening or malalignment.  There is no joint  effusion.  The AP view also shows the right knee that does have severe arthritic changes.    PMFS History: Patient Active Problem List   Diagnosis Date Noted  . Status post total left knee replacement 03/04/2019  . Unilateral primary osteoarthritis, left knee 09/08/2017  . Unilateral primary osteoarthritis, right knee 09/08/2017   Past Medical History:  Diagnosis Date  . Allergy   . 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 Hypertension   . Osteoporosis   . Rheumatoid arthritis(714.0)     Family History  Problem Relation Age of Onset  . Hypertension Mother   . Hypertension Father     Past Surgical History:  Procedure Laterality Date  . FOOT SURGERY  1998, 2001  . KNEE SURGERY  2003   left  . TOTAL KNEE ARTHROPLASTY Left 03/04/2019   Procedure: LEFT TOTAL  KNEE ARTHROPLASTY;  Surgeon: Kathryne Hitch, MD;  Location: [facility];  Service: Orthopedics;  Laterality: Left;   Social History   Occupational History  . Not on file  Tobacco Use  . Smoking status: Never Smoker  . Smokeless tobacco: Never Used  Substance and Sexual Activity  . Alcohol use: No  . Drug use: No  . Sexual activity: Not on file

## 2019-09-14 NOTE — Telephone Encounter (Signed)
Left message informing pt I could write a note for him to be out of work 09/14/2019 - 09/15/2019, I would need to know how he would like to receive the note.

## 2019-09-14 NOTE — Telephone Encounter (Signed)
LMOM for patient letting him know letter was ready at front desk

## 2019-09-27 DIAGNOSIS — M0579 Rheumatoid arthritis with rheumatoid factor of multiple sites without organ or systems involvement: Secondary | ICD-10-CM | POA: Diagnosis not present

## 2019-11-03 DIAGNOSIS — M1712 Unilateral primary osteoarthritis, left knee: Secondary | ICD-10-CM | POA: Diagnosis not present

## 2019-11-03 DIAGNOSIS — Z79899 Other long term (current) drug therapy: Secondary | ICD-10-CM | POA: Diagnosis not present

## 2019-11-03 DIAGNOSIS — M0579 Rheumatoid arthritis with rheumatoid factor of multiple sites without organ or systems involvement: Secondary | ICD-10-CM | POA: Diagnosis not present

## 2019-11-22 DIAGNOSIS — M0579 Rheumatoid arthritis with rheumatoid factor of multiple sites without organ or systems involvement: Secondary | ICD-10-CM | POA: Diagnosis not present

## 2020-01-17 DIAGNOSIS — M0579 Rheumatoid arthritis with rheumatoid factor of multiple sites without organ or systems involvement: Secondary | ICD-10-CM | POA: Diagnosis not present

## 2020-02-23 DIAGNOSIS — R7303 Prediabetes: Secondary | ICD-10-CM | POA: Diagnosis not present

## 2020-02-23 DIAGNOSIS — I10 Essential (primary) hypertension: Secondary | ICD-10-CM | POA: Diagnosis not present

## 2020-02-23 DIAGNOSIS — M0579 Rheumatoid arthritis with rheumatoid factor of multiple sites without organ or systems involvement: Secondary | ICD-10-CM | POA: Diagnosis not present

## 2020-03-02 DIAGNOSIS — R7303 Prediabetes: Secondary | ICD-10-CM | POA: Diagnosis not present

## 2020-03-02 DIAGNOSIS — I10 Essential (primary) hypertension: Secondary | ICD-10-CM | POA: Diagnosis not present

## 2020-03-02 DIAGNOSIS — M0579 Rheumatoid arthritis with rheumatoid factor of multiple sites without organ or systems involvement: Secondary | ICD-10-CM | POA: Diagnosis not present

## 2020-03-02 DIAGNOSIS — Z23 Encounter for immunization: Secondary | ICD-10-CM | POA: Diagnosis not present

## 2020-03-13 DIAGNOSIS — M0579 Rheumatoid arthritis with rheumatoid factor of multiple sites without organ or systems involvement: Secondary | ICD-10-CM | POA: Diagnosis not present

## 2020-03-15 ENCOUNTER — Ambulatory Visit: Payer: BC Managed Care – PPO | Admitting: Orthopaedic Surgery

## 2020-03-21 ENCOUNTER — Ambulatory Visit: Payer: BC Managed Care – PPO | Admitting: Orthopaedic Surgery

## 2020-05-08 DIAGNOSIS — M0579 Rheumatoid arthritis with rheumatoid factor of multiple sites without organ or systems involvement: Secondary | ICD-10-CM | POA: Diagnosis not present

## 2020-05-17 DIAGNOSIS — M1712 Unilateral primary osteoarthritis, left knee: Secondary | ICD-10-CM | POA: Diagnosis not present

## 2020-05-17 DIAGNOSIS — Z79899 Other long term (current) drug therapy: Secondary | ICD-10-CM | POA: Diagnosis not present

## 2020-05-17 DIAGNOSIS — M0579 Rheumatoid arthritis with rheumatoid factor of multiple sites without organ or systems involvement: Secondary | ICD-10-CM | POA: Diagnosis not present

## 2020-07-03 DIAGNOSIS — M0579 Rheumatoid arthritis with rheumatoid factor of multiple sites without organ or systems involvement: Secondary | ICD-10-CM | POA: Diagnosis not present

## 2020-08-08 ENCOUNTER — Telehealth: Payer: Self-pay

## 2020-08-08 NOTE — Telephone Encounter (Signed)
Faxed to provided number  

## 2020-08-08 NOTE — Telephone Encounter (Signed)
Anthony Boyd at [facility] would like a note/letter faxed to [phone] if pre-medication is required for dental procedure.  Cb# [phone].  Please advise.  Thank you.

## 2020-08-15 DIAGNOSIS — M549 Dorsalgia, unspecified: Secondary | ICD-10-CM | POA: Diagnosis not present

## 2020-08-15 DIAGNOSIS — N200 Calculus of kidney: Secondary | ICD-10-CM | POA: Diagnosis not present

## 2020-08-15 DIAGNOSIS — I10 Essential (primary) hypertension: Secondary | ICD-10-CM | POA: Diagnosis not present

## 2020-08-17 DIAGNOSIS — I10 Essential (primary) hypertension: Secondary | ICD-10-CM | POA: Diagnosis not present

## 2020-08-17 DIAGNOSIS — Z125 Encounter for screening for malignant neoplasm of prostate: Secondary | ICD-10-CM | POA: Diagnosis not present

## 2020-08-17 DIAGNOSIS — R7303 Prediabetes: Secondary | ICD-10-CM | POA: Diagnosis not present

## 2020-08-17 DIAGNOSIS — M0579 Rheumatoid arthritis with rheumatoid factor of multiple sites without organ or systems involvement: Secondary | ICD-10-CM | POA: Diagnosis not present

## 2020-08-21 DIAGNOSIS — Z79899 Other long term (current) drug therapy: Secondary | ICD-10-CM | POA: Diagnosis not present

## 2020-08-21 DIAGNOSIS — M0579 Rheumatoid arthritis with rheumatoid factor of multiple sites without organ or systems involvement: Secondary | ICD-10-CM | POA: Diagnosis not present

## 2020-08-21 DIAGNOSIS — M25532 Pain in left wrist: Secondary | ICD-10-CM | POA: Diagnosis not present

## 2020-08-21 DIAGNOSIS — M1712 Unilateral primary osteoarthritis, left knee: Secondary | ICD-10-CM | POA: Diagnosis not present

## 2020-08-24 DIAGNOSIS — M0579 Rheumatoid arthritis with rheumatoid factor of multiple sites without organ or systems involvement: Secondary | ICD-10-CM | POA: Diagnosis not present

## 2020-08-24 DIAGNOSIS — Z0000 Encounter for general adult medical examination without abnormal findings: Secondary | ICD-10-CM | POA: Diagnosis not present

## 2020-08-24 DIAGNOSIS — K922 Gastrointestinal hemorrhage, unspecified: Secondary | ICD-10-CM | POA: Diagnosis not present

## 2020-08-24 DIAGNOSIS — R300 Dysuria: Secondary | ICD-10-CM | POA: Diagnosis not present

## 2020-08-24 DIAGNOSIS — R7303 Prediabetes: Secondary | ICD-10-CM | POA: Diagnosis not present

## 2020-08-28 DIAGNOSIS — M0579 Rheumatoid arthritis with rheumatoid factor of multiple sites without organ or systems involvement: Secondary | ICD-10-CM | POA: Diagnosis not present

## 2020-10-23 DIAGNOSIS — M0579 Rheumatoid arthritis with rheumatoid factor of multiple sites without organ or systems involvement: Secondary | ICD-10-CM | POA: Diagnosis not present

## 2020-12-04 DIAGNOSIS — M79643 Pain in unspecified hand: Secondary | ICD-10-CM | POA: Diagnosis not present

## 2020-12-04 DIAGNOSIS — Z79899 Other long term (current) drug therapy: Secondary | ICD-10-CM | POA: Diagnosis not present

## 2020-12-04 DIAGNOSIS — M0579 Rheumatoid arthritis with rheumatoid factor of multiple sites without organ or systems involvement: Secondary | ICD-10-CM | POA: Diagnosis not present

## 2020-12-04 DIAGNOSIS — M7989 Other specified soft tissue disorders: Secondary | ICD-10-CM | POA: Diagnosis not present

## 2020-12-12 DIAGNOSIS — Z03818 Encounter for observation for suspected exposure to other biological agents ruled out: Secondary | ICD-10-CM | POA: Diagnosis not present

## 2020-12-18 DIAGNOSIS — M0579 Rheumatoid arthritis with rheumatoid factor of multiple sites without organ or systems involvement: Secondary | ICD-10-CM | POA: Diagnosis not present

## 2021-01-15 DIAGNOSIS — Z79899 Other long term (current) drug therapy: Secondary | ICD-10-CM | POA: Diagnosis not present

## 2021-01-15 DIAGNOSIS — M0579 Rheumatoid arthritis with rheumatoid factor of multiple sites without organ or systems involvement: Secondary | ICD-10-CM | POA: Diagnosis not present

## 2021-01-15 DIAGNOSIS — M79643 Pain in unspecified hand: Secondary | ICD-10-CM | POA: Diagnosis not present

## 2021-01-15 DIAGNOSIS — M7989 Other specified soft tissue disorders: Secondary | ICD-10-CM | POA: Diagnosis not present

## 2021-02-12 DIAGNOSIS — Z125 Encounter for screening for malignant neoplasm of prostate: Secondary | ICD-10-CM | POA: Diagnosis not present

## 2021-02-12 DIAGNOSIS — M0579 Rheumatoid arthritis with rheumatoid factor of multiple sites without organ or systems involvement: Secondary | ICD-10-CM | POA: Diagnosis not present

## 2021-04-09 DIAGNOSIS — M0579 Rheumatoid arthritis with rheumatoid factor of multiple sites without organ or systems involvement: Secondary | ICD-10-CM | POA: Diagnosis not present

## 2021-05-15 DIAGNOSIS — M0579 Rheumatoid arthritis with rheumatoid factor of multiple sites without organ or systems involvement: Secondary | ICD-10-CM | POA: Diagnosis not present

## 2021-05-15 DIAGNOSIS — M7989 Other specified soft tissue disorders: Secondary | ICD-10-CM | POA: Diagnosis not present

## 2021-05-15 DIAGNOSIS — M79643 Pain in unspecified hand: Secondary | ICD-10-CM | POA: Diagnosis not present

## 2021-05-15 DIAGNOSIS — Z79899 Other long term (current) drug therapy: Secondary | ICD-10-CM | POA: Diagnosis not present

## 2021-06-04 DIAGNOSIS — M0579 Rheumatoid arthritis with rheumatoid factor of multiple sites without organ or systems involvement: Secondary | ICD-10-CM | POA: Diagnosis not present

## 2021-07-30 DIAGNOSIS — M0579 Rheumatoid arthritis with rheumatoid factor of multiple sites without organ or systems involvement: Secondary | ICD-10-CM | POA: Diagnosis not present

## 2021-08-22 DIAGNOSIS — M0579 Rheumatoid arthritis with rheumatoid factor of multiple sites without organ or systems involvement: Secondary | ICD-10-CM | POA: Diagnosis not present

## 2021-08-22 DIAGNOSIS — Z0000 Encounter for general adult medical examination without abnormal findings: Secondary | ICD-10-CM | POA: Diagnosis not present

## 2021-08-22 DIAGNOSIS — Z125 Encounter for screening for malignant neoplasm of prostate: Secondary | ICD-10-CM | POA: Diagnosis not present

## 2021-08-22 DIAGNOSIS — Z79899 Other long term (current) drug therapy: Secondary | ICD-10-CM | POA: Diagnosis not present

## 2021-08-22 DIAGNOSIS — M7989 Other specified soft tissue disorders: Secondary | ICD-10-CM | POA: Diagnosis not present

## 2021-08-22 DIAGNOSIS — M79643 Pain in unspecified hand: Secondary | ICD-10-CM | POA: Diagnosis not present

## 2021-08-26 DIAGNOSIS — Z0000 Encounter for general adult medical examination without abnormal findings: Secondary | ICD-10-CM | POA: Diagnosis not present

## 2021-08-26 DIAGNOSIS — R7989 Other specified abnormal findings of blood chemistry: Secondary | ICD-10-CM | POA: Diagnosis not present

## 2021-08-26 DIAGNOSIS — R7303 Prediabetes: Secondary | ICD-10-CM | POA: Diagnosis not present

## 2021-08-26 DIAGNOSIS — I10 Essential (primary) hypertension: Secondary | ICD-10-CM | POA: Diagnosis not present

## 2021-08-26 DIAGNOSIS — M0579 Rheumatoid arthritis with rheumatoid factor of multiple sites without organ or systems involvement: Secondary | ICD-10-CM | POA: Diagnosis not present

## 2021-09-24 DIAGNOSIS — M0579 Rheumatoid arthritis with rheumatoid factor of multiple sites without organ or systems involvement: Secondary | ICD-10-CM | POA: Diagnosis not present

## 2021-09-25 ENCOUNTER — Other Ambulatory Visit: Payer: Self-pay | Admitting: Internal Medicine

## 2021-09-25 DIAGNOSIS — R748 Abnormal levels of other serum enzymes: Secondary | ICD-10-CM

## 2021-10-22 DIAGNOSIS — R7989 Other specified abnormal findings of blood chemistry: Secondary | ICD-10-CM | POA: Diagnosis not present

## 2021-10-22 DIAGNOSIS — R7303 Prediabetes: Secondary | ICD-10-CM | POA: Diagnosis not present

## 2021-10-22 DIAGNOSIS — I10 Essential (primary) hypertension: Secondary | ICD-10-CM | POA: Diagnosis not present

## 2021-10-28 ENCOUNTER — Ambulatory Visit (INDEPENDENT_AMBULATORY_CARE_PROVIDER_SITE_OTHER): Payer: BC Managed Care – PPO

## 2021-10-28 ENCOUNTER — Telehealth: Payer: Self-pay

## 2021-10-28 ENCOUNTER — Ambulatory Visit: Payer: BC Managed Care – PPO | Admitting: Orthopaedic Surgery

## 2021-10-28 DIAGNOSIS — M25561 Pain in right knee: Secondary | ICD-10-CM

## 2021-10-28 DIAGNOSIS — G8929 Other chronic pain: Secondary | ICD-10-CM

## 2021-10-28 DIAGNOSIS — M1711 Unilateral primary osteoarthritis, right knee: Secondary | ICD-10-CM | POA: Diagnosis not present

## 2021-10-28 MED ORDER — METHYLPREDNISOLONE ACETATE 40 MG/ML IJ SUSP
40.0000 mg | INTRAMUSCULAR | Status: AC | PRN
  Administered 2021-10-28: 40 mg via INTRA_ARTICULAR

## 2021-10-28 MED ORDER — LIDOCAINE HCL 1 % IJ SOLN
3.0000 mL | INTRAMUSCULAR | Status: AC | PRN
  Administered 2021-10-28: 3 mL

## 2021-10-28 NOTE — Telephone Encounter (Signed)
Right knee gel injection  

## 2021-10-28 NOTE — Progress Notes (Signed)
Office Visit Note   Patient: Anthony Boyd           Date of Birth: 03/05/1966           MRN: [id number] Visit Date: 10/28/2021              Requested by: Merrilee Seashore, [address] [place],  [place] [address] PCP: Merrilee Seashore, MD   Assessment & Plan: Visit Diagnoses:  1. Chronic pain of right knee   2. Unilateral primary osteoarthritis, right knee     Plan: He does have significant arthritis in his right knee and I showed his x-rays to him.  I did provide a steroid injection in his right knee today to calm down the acute pain.  He has had hyaluronic acid before in the left knee and would like to try this for the right knee and I agree with this conservative treatment plan.  We will hopefully get this ordered and approved and get him back in the office at some point for injection in his right knee with hyaluronic acid to treat the pain from osteoarthritis.  He agrees with this treatment plan.  He knows we will be in touch.  Follow-Up Instructions: No follow-ups on file.   Orders:  Orders Placed This Encounter  Procedures   Large Joint Inj   XR Knee 1-2 Views Right   No orders of the defined types were placed in this encounter.     Procedures: Large Joint Inj: R knee on 10/28/2021 9:29 AM Indications: diagnostic evaluation and pain Details: 22 G 1.5 in needle, superolateral approach  Arthrogram: No  Medications: 3 mL lidocaine 1 %; 40 mg methylPREDNISolone acetate 40 MG/ML Outcome: tolerated well, no immediate complications Procedure, treatment alternatives, risks and benefits explained, specific risks discussed. Consent was given by the patient. Immediately prior to procedure a time out was called to verify the correct patient, procedure, equipment, support staff and site/side marked as required. Patient was prepped and draped in the usual sterile fashion.      Clinical Data: No additional findings.   Subjective: Chief Complaint   Patient presents with   Right Knee - Pain  The patient is well-known to me.  We replaced his left knee in 2020.  Even at that time his right knee had significant osteoarthritis.  He has been having some knee pain for a while now and is starting to ache quite a bit even without activities.  There is no locking catching.  He says his left total knee is doing well.  He has had no acute changes in medical status.  He is only 32.  HPI  Review of Systems There is currently listed no fever, chills, nausea, vomiting  Objective: Vital Signs: There were no vitals taken for this visit.  Physical Exam He is alert and orient x3 and in no acute distress Ortho Exam Examination of his left operative knee shows full range of motion and is stable with no effusion.  His right knee does have some slight varus malalignment.  There is a mild effusion and pain throughout the arc of motion.  I can barely flex him past 90 degrees. Specialty Comments:  No specialty comments available.  Imaging: XR Knee 1-2 Views Right  Result Date: 10/28/2021 2 views of the right knee show tricompartment arthritis with varus malalignment, there are osteophytes in all 3 compartments.  There is bone-on-bone wear of the medial compartment and the patellofemoral compartment.  There is  also sclerotic changes and some subchondral cystic changes.    PMFS History: Patient Active Problem List   Diagnosis Date Noted   Status post total left knee replacement 03/04/2019   Unilateral primary osteoarthritis, left knee 09/08/2017   Unilateral primary osteoarthritis, right knee 09/08/2017   Past Medical History:  Diagnosis Date   Allergy    Arthritis    Knees, 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   Hypertension    Osteoporosis    Rheumatoid arthritis(714.0)     Family History  Problem Relation Age of Onset   Hypertension Mother    Hypertension Father     Past Surgical History:  Procedure Laterality Date   FOOT SURGERY  1998, 2001   KNEE SURGERY   2003   left   TOTAL KNEE ARTHROPLASTY Left 03/04/2019   Procedure: LEFT TOTAL KNEE ARTHROPLASTY;  Surgeon: Mcarthur Rossetti, MD;  Location: [facility];  Service: Orthopedics;  Laterality: Left;   Social History   Occupational History   Not on file  Tobacco Use   Smoking status: Never   Smokeless tobacco: Never  Vaping Use   Vaping Use: Never used  Substance and Sexual Activity   Alcohol use: No   Drug use: No   Sexual activity: Not on file

## 2021-11-08 NOTE — Telephone Encounter (Signed)
Noted  

## 2021-11-19 DIAGNOSIS — M0579 Rheumatoid arthritis with rheumatoid factor of multiple sites without organ or systems involvement: Secondary | ICD-10-CM | POA: Diagnosis not present

## 2021-11-27 DIAGNOSIS — M0579 Rheumatoid arthritis with rheumatoid factor of multiple sites without organ or systems involvement: Secondary | ICD-10-CM | POA: Diagnosis not present

## 2021-11-27 DIAGNOSIS — S335XXA Sprain of ligaments of lumbar spine, initial encounter: Secondary | ICD-10-CM | POA: Diagnosis not present

## 2021-11-27 DIAGNOSIS — R21 Rash and other nonspecific skin eruption: Secondary | ICD-10-CM | POA: Diagnosis not present

## 2021-12-11 ENCOUNTER — Telehealth: Payer: Self-pay | Admitting: Orthopedic Surgery

## 2021-12-11 NOTE — Telephone Encounter (Signed)
Anthony Boyd is calling in to see if the gel injection for his knee has been approved?  He can be reached at [phone].

## 2021-12-11 NOTE — Telephone Encounter (Signed)
Talked with patient concerning gel injection.  Appt. Has been scheduled.

## 2021-12-16 ENCOUNTER — Ambulatory Visit: Payer: BC Managed Care – PPO | Admitting: Orthopaedic Surgery

## 2021-12-16 ENCOUNTER — Telehealth: Payer: Self-pay | Admitting: Orthopaedic Surgery

## 2021-12-16 ENCOUNTER — Encounter: Payer: Self-pay | Admitting: Orthopaedic Surgery

## 2021-12-16 ENCOUNTER — Other Ambulatory Visit: Payer: Self-pay

## 2021-12-16 DIAGNOSIS — M1711 Unilateral primary osteoarthritis, right knee: Secondary | ICD-10-CM | POA: Diagnosis not present

## 2021-12-16 MED ORDER — HYLAN G-F 20 48 MG/6ML IX SOSY
48.0000 mg | PREFILLED_SYRINGE | INTRA_ARTICULAR | Status: AC | PRN
  Administered 2021-12-16: 48 mg via INTRA_ARTICULAR

## 2021-12-16 MED ORDER — LIDOCAINE HCL 1 % IJ SOLN
4.0000 mL | INTRAMUSCULAR | Status: AC | PRN
  Administered 2021-12-16: 4 mL

## 2021-12-16 NOTE — Progress Notes (Signed)
   Procedure Note  Patient: Anthony Boyd             Date of Birth: 1966/03/25           MRN: [id number]             Visit Date: 12/16/2021 HPI: Patient comes in today for scheduled Synvisc 1 injection right knee.  He has known significant arthritis of the right knee that affects his gait.  He is on chronic Celebrex.  He has no known upcoming knee surgery on the right in the next 6 months.  Physical exam: Right knee good range of motion.  Significant patellofemoral crepitus.  No abnormal warmth erythema or effusion.  Procedures: Visit Diagnoses:  1. Unilateral primary osteoarthritis, right knee     Large Joint Inj: R knee on 12/16/2021 11:34 AM Indications: pain Details: 22 G 1.5 in needle, superolateral approach  Arthrogram: No  Medications: 4 mL lidocaine 1 %; 48 mg Hylan 48 MG/6ML Outcome: tolerated well, no immediate complications Procedure, treatment alternatives, risks and benefits explained, specific risks discussed. Consent was given by the patient. Immediately prior to procedure a time out was called to verify the correct patient, procedure, equipment, support staff and site/side marked as required. Patient was prepped and draped in the usual sterile fashion.      Plan: We will see him back in 8 weeks see how he responded to the Synvisc 1 injection.  He understands he needs to wait 6 months between supplemental injections.  Questions were encouraged and answered at length today.  Ace bandage was applied after the injection today he will remove this before retiring to bed this evening.

## 2021-12-16 NOTE — Telephone Encounter (Signed)
Patient came in to check in at 10:53 but appt was at 9:45 he needs an injection would we be able to fit him on the schedule this week preferably tomorrow being he is off work or this afternoon. Please advise

## 2021-12-19 DIAGNOSIS — M79643 Pain in unspecified hand: Secondary | ICD-10-CM | POA: Diagnosis not present

## 2021-12-19 DIAGNOSIS — Z79899 Other long term (current) drug therapy: Secondary | ICD-10-CM | POA: Diagnosis not present

## 2021-12-19 DIAGNOSIS — M25561 Pain in right knee: Secondary | ICD-10-CM | POA: Diagnosis not present

## 2021-12-19 DIAGNOSIS — M0579 Rheumatoid arthritis with rheumatoid factor of multiple sites without organ or systems involvement: Secondary | ICD-10-CM | POA: Diagnosis not present

## 2022-01-14 DIAGNOSIS — M0579 Rheumatoid arthritis with rheumatoid factor of multiple sites without organ or systems involvement: Secondary | ICD-10-CM | POA: Diagnosis not present

## 2022-02-12 ENCOUNTER — Ambulatory Visit: Payer: BC Managed Care – PPO | Admitting: Orthopaedic Surgery

## 2022-02-26 ENCOUNTER — Ambulatory Visit: Payer: BC Managed Care – PPO | Admitting: Orthopaedic Surgery

## 2022-02-26 ENCOUNTER — Encounter: Payer: Self-pay | Admitting: Orthopaedic Surgery

## 2022-02-26 DIAGNOSIS — G8929 Other chronic pain: Secondary | ICD-10-CM

## 2022-02-26 DIAGNOSIS — M25561 Pain in right knee: Secondary | ICD-10-CM | POA: Diagnosis not present

## 2022-02-26 DIAGNOSIS — M1711 Unilateral primary osteoarthritis, right knee: Secondary | ICD-10-CM

## 2022-02-26 NOTE — Progress Notes (Signed)
The patient is coming in today for follow-up 2 months after having Synvisc 1 placed in his right knee to treat the pain from osteoarthritis.  He says his knee is actually doing a lot better.  He is 56 years old.  He has been having significant right ankle and foot issues.  He actually is scheduled to see Dr[facility]ulla [facility].  He has had a great experience with Dr. Paulla Dolly in the past for dealing with issues with his left foot and ankle.  He says he would be much better overall except for what is going on with his right foot and ankle.  His right knee has slight varus malalignment with no effusion today and is moving well overall and I would say he is definitely had some success with a hyaluronic acid injection.  He understands that he should wait at least 6 months between these types of injections.  I would hold off on knee replacement surgery for now since he is doing well.  Hopefully he will have luck with Dr. Paulla Dolly again as a relates to now his issues with his right foot and ankle.  Follow-up is as needed.

## 2022-03-05 ENCOUNTER — Ambulatory Visit: Payer: BC Managed Care – PPO | Admitting: Podiatry

## 2022-03-05 ENCOUNTER — Ambulatory Visit (INDEPENDENT_AMBULATORY_CARE_PROVIDER_SITE_OTHER): Payer: BC Managed Care – PPO

## 2022-03-05 DIAGNOSIS — M7751 Other enthesopathy of right foot: Secondary | ICD-10-CM

## 2022-03-05 DIAGNOSIS — M05771 Rheumatoid arthritis with rheumatoid factor of right ankle and foot without organ or systems involvement: Secondary | ICD-10-CM

## 2022-03-05 MED ORDER — TRIAMCINOLONE ACETONIDE 10 MG/ML IJ SUSP
10.0000 mg | Freq: Once | INTRAMUSCULAR | Status: AC
  Administered 2022-03-05: 10 mg

## 2022-03-05 NOTE — Progress Notes (Signed)
Subjective:   Patient ID: Anthony Boyd, male   DOB: 56 y.o.   MRN: [id number]   HPI Patient presents stating his ankle is starting to bother him more and more and he needs knee replacement right but they want his foot fixed first before that is done as the left one was fixed years ago followed by the left knee.  States it is getting harder and harder to walk on this with any degree of comfort   ROS      Objective:  Physical Exam  Neurovascular status found to be intact with inflammation pain of the subtalar joint right and also does have discomfort extending into calcaneocuboid talonavicular joint right with history of fusion left.  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  We will get the results and decide what will be best for him long-term  X-rays indicate that there is multiple signs of arthritis subtalar joint talonavicular joint right probable calcaneocuboid and the left does have screws and stable fixation in place

## 2022-03-11 DIAGNOSIS — M0579 Rheumatoid arthritis with rheumatoid factor of multiple sites without organ or systems involvement: Secondary | ICD-10-CM | POA: Diagnosis not present

## 2022-03-25 DIAGNOSIS — Z23 Encounter for immunization: Secondary | ICD-10-CM | POA: Diagnosis not present

## 2022-03-25 DIAGNOSIS — Z79899 Other long term (current) drug therapy: Secondary | ICD-10-CM | POA: Diagnosis not present

## 2022-03-25 DIAGNOSIS — M79643 Pain in unspecified hand: Secondary | ICD-10-CM | POA: Diagnosis not present

## 2022-03-25 DIAGNOSIS — M25561 Pain in right knee: Secondary | ICD-10-CM | POA: Diagnosis not present

## 2022-03-25 DIAGNOSIS — M0579 Rheumatoid arthritis with rheumatoid factor of multiple sites without organ or systems involvement: Secondary | ICD-10-CM | POA: Diagnosis not present

## 2022-03-28 ENCOUNTER — Encounter: Payer: Self-pay | Admitting: Neurology

## 2022-04-01 DIAGNOSIS — K420 Umbilical hernia with obstruction, without gangrene: Secondary | ICD-10-CM | POA: Diagnosis not present

## 2022-04-07 ENCOUNTER — Ambulatory Visit: Payer: Self-pay | Admitting: Surgery

## 2022-04-07 DIAGNOSIS — K429 Umbilical hernia without obstruction or gangrene: Secondary | ICD-10-CM | POA: Diagnosis not present

## 2022-04-07 NOTE — H&P (Signed)
Subjective   Chief Complaint: Umbilical Hernia     History of Present Illness: Anthony Boyd is a 56 y.o. male who is seen today as an office consultation at the request of Dr. Hall Busing for evaluation of Umbilical Hernia  This is a 56 year old male with rheumatoid arthritis and obesity who presents with at least 12 years of a slowly enlarging umbilical hernia.  The patient's job requires a lot of lifting and standing on his feet all day.  The tenderness at his umbilicus has interfered with his ability to work.  He denies any GI obstructive symptoms.  He was referred to [place] to discuss surgical repair of this umbilical hernia.   Review of Systems: A complete review of systems was obtained from the patient.  I have reviewed this information and discussed as appropriate with the patient.  See HPI as well for other ROS.  Review of Systems  Constitutional: Negative.   HENT: Negative.    Eyes: Negative.   Respiratory: Negative.    Cardiovascular:  Positive for leg swelling.  Gastrointestinal:  Positive for abdominal pain.  Genitourinary: Negative.   Musculoskeletal:  Positive for joint pain.  Skin: Negative.   Neurological: Negative.   Endo/Heme/Allergies: Negative.   Psychiatric/Behavioral: Negative.        Medical History: History reviewed. No pertinent past medical history.  Patient Active Problem List  Diagnosis   Status post total left knee replacement   Unilateral primary osteoarthritis, left knee    Past Surgical History:  Procedure Laterality Date   REPLACEMENT TOTAL KNEE Left 2018     No Known Allergies  Current Outpatient Medications on File Prior to Visit  Medication Sig Dispense Refill   amLODIPine (NORVASC) 10 MG tablet Take 10 mg by mouth once daily     celecoxib (CELEBREX) 200 MG capsule Take 200 mg by mouth 2 (two) times daily     predniSONE (DELTASONE) 10 MG tablet Take 10 mg by mouth once daily as needed     No current facility-administered medications on  file prior to visit.    Family History  Problem Relation Age of Onset   High blood pressure (Hypertension) Mother    High blood pressure (Hypertension) Father    High blood pressure (Hypertension) Sister      Social History   Tobacco Use  Smoking Status Never  Smokeless Tobacco Never     Social History   Socioeconomic History   Marital status: Single  Tobacco Use   Smoking status: Never   Smokeless tobacco: Never  Substance and Sexual Activity   Alcohol use: Not Currently   Drug use: Defer    Objective:    Vitals:   04/07/22 1032  BP: (!) 158/110  Pulse: 93  Temp: 36.8 C (98.3 F)  SpO2: 99%  Weight: (!) 105.8 kg (233 lb 3.2 oz)  Height: 170.2 cm (5\' 7" )    Body mass index is 36.52 kg/m.  Physical Exam   Constitutional:  WDWN in NAD, conversant, no obvious deformities; lying in bed comfortably Eyes:  Pupils equal, round; sclera anicteric; moist conjunctiva; no lid lag HENT:  Oral mucosa moist; good dentition  Neck:  No masses palpated, trachea midline; no thyromegaly Lungs:  CTA bilaterally; normal respiratory effort  CV:  Regular rate and rhythm; no murmurs; extremities well-perfused with no edema Abd:  +bowel sounds, soft, non-tender, no palpable organomegaly; protruding umbilical hernia with a hernia sac approximately 4 cm in diameter.  This is partially reducible.  The fascial defect  is about 2 cm in diameter. Musc: Healed incision left leg and left ankle; patient walks with a limp; no apparent clubbing or cyanosis in extremities Lymphatic:  No palpable cervical or axillary lymphadenopathy Skin:  Warm, dry; no sign of jaundice Psychiatric - alert and oriented x 4; calm mood and affect   Assessment and Plan:  Diagnoses and all orders for this visit:  Umbilical hernia without obstruction or gangrene     Recommend umbilical hernia repair with mesh.The surgical procedure has been discussed with the patient.  Potential risks, benefits, alternative  treatments, and expected outcomes have been explained.  All of the patient's questions at this time have been answered.  The likelihood of reaching the patient's treatment goal is good.  The patient understand the proposed surgical procedure and wishes to proceed.   Harrison Paulson Delbert Harness, MD  04/07/2022 10:49 AM

## 2022-04-07 NOTE — H&P (View-Only) (Signed)
Subjective   Chief Complaint: Umbilical Hernia     History of Present Illness: Anthony Boyd is a 56 y.o. male who is seen today as an office consultation at the request of Dr. Hall Busing for evaluation of Umbilical Hernia  This is a 56 year old male with rheumatoid arthritis and obesity who presents with at least 12 years of a slowly enlarging umbilical hernia.  The patient's job requires a lot of lifting and standing on his feet all day.  The tenderness at his umbilicus has interfered with his ability to work.  He denies any GI obstructive symptoms.  He was referred to [place] to discuss surgical repair of this umbilical hernia.   Review of Systems: A complete review of systems was obtained from the patient.  I have reviewed this information and discussed as appropriate with the patient.  See HPI as well for other ROS.  Review of Systems  Constitutional: Negative.   HENT: Negative.    Eyes: Negative.   Respiratory: Negative.    Cardiovascular:  Positive for leg swelling.  Gastrointestinal:  Positive for abdominal pain.  Genitourinary: Negative.   Musculoskeletal:  Positive for joint pain.  Skin: Negative.   Neurological: Negative.   Endo/Heme/Allergies: Negative.   Psychiatric/Behavioral: Negative.        Medical History: History reviewed. No pertinent past medical history.  Patient Active Problem List  Diagnosis   Status post total left knee replacement   Unilateral primary osteoarthritis, left knee    Past Surgical History:  Procedure Laterality Date   REPLACEMENT TOTAL KNEE Left 2018     No Known Allergies  Current Outpatient Medications on File Prior to Visit  Medication Sig Dispense Refill   amLODIPine (NORVASC) 10 MG tablet Take 10 mg by mouth once daily     celecoxib (CELEBREX) 200 MG capsule Take 200 mg by mouth 2 (two) times daily     predniSONE (DELTASONE) 10 MG tablet Take 10 mg by mouth once daily as needed     No current facility-administered medications on  file prior to visit.    Family History  Problem Relation Age of Onset   High blood pressure (Hypertension) Mother    High blood pressure (Hypertension) Father    High blood pressure (Hypertension) Sister      Social History   Tobacco Use  Smoking Status Never  Smokeless Tobacco Never     Social History   Socioeconomic History   Marital status: Single  Tobacco Use   Smoking status: Never   Smokeless tobacco: Never  Substance and Sexual Activity   Alcohol use: Not Currently   Drug use: Defer    Objective:    Vitals:   04/07/22 1032  BP: (!) 158/110  Pulse: 93  Temp: 36.8 C (98.3 F)  SpO2: 99%  Weight: (!) 105.8 kg (233 lb 3.2 oz)  Height: 170.2 cm (5\' 7" )    Body mass index is 36.52 kg/m.  Physical Exam   Constitutional:  WDWN in NAD, conversant, no obvious deformities; lying in bed comfortably Eyes:  Pupils equal, round; sclera anicteric; moist conjunctiva; no lid lag HENT:  Oral mucosa moist; good dentition  Neck:  No masses palpated, trachea midline; no thyromegaly Lungs:  CTA bilaterally; normal respiratory effort  CV:  Regular rate and rhythm; no murmurs; extremities well-perfused with no edema Abd:  +bowel sounds, soft, non-tender, no palpable organomegaly; protruding umbilical hernia with a hernia sac approximately 4 cm in diameter.  This is partially reducible.  The fascial defect  is about 2 cm in diameter. Musc: Healed incision left leg and left ankle; patient walks with a limp; no apparent clubbing or cyanosis in extremities Lymphatic:  No palpable cervical or axillary lymphadenopathy Skin:  Warm, dry; no sign of jaundice Psychiatric - alert and oriented x 4; calm mood and affect   Assessment and Plan:  Diagnoses and all orders for this visit:  Umbilical hernia without obstruction or gangrene     Recommend umbilical hernia repair with mesh.The surgical procedure has been discussed with the patient.  Potential risks, benefits, alternative  treatments, and expected outcomes have been explained.  All of the patient's questions at this time have been answered.  The likelihood of reaching the patient's treatment goal is good.  The patient understand the proposed surgical procedure and wishes to proceed.   Mry Lamia Delbert Harness, MD  04/07/2022 10:49 AM

## 2022-04-25 ENCOUNTER — Encounter (HOSPITAL_BASED_OUTPATIENT_CLINIC_OR_DEPARTMENT_OTHER): Payer: Self-pay | Admitting: Surgery

## 2022-04-25 ENCOUNTER — Encounter (HOSPITAL_BASED_OUTPATIENT_CLINIC_OR_DEPARTMENT_OTHER)
Admission: RE | Admit: 2022-04-25 | Discharge: 2022-04-25 | Disposition: A | Discharge status: Home / Self Care | Payer: BC Managed Care – PPO | Source: Ambulatory Visit | Attending: Surgery | Admitting: Surgery

## 2022-04-25 DIAGNOSIS — I10 Essential (primary) hypertension: Secondary | ICD-10-CM | POA: Diagnosis not present

## 2022-04-25 DIAGNOSIS — Z01818 Encounter for other preprocedural examination: Secondary | ICD-10-CM | POA: Diagnosis not present

## 2022-04-25 LAB — BASIC METABOLIC PANEL
Anion gap: 7 (ref 5–15)
BUN: 12 mg/dL (ref 6–20)
CO2: 25 mmol/L (ref 22–32)
Calcium: 8.7 mg/dL — ABNORMAL LOW (ref 8.9–10.3)
Chloride: 104 mmol/L (ref 98–111)
Creatinine, Ser: 0.69 mg/dL (ref 0.61–1.24)
GFR, Estimated: >60 mL/min (ref >60)
Glucose, Bld: 98 mg/dL (ref 70–99)
Potassium: 4.3 mmol/L (ref 3.5–5.1)
Sodium: 136 mmol/L (ref 135–145)

## 2022-04-25 MED ORDER — CHLORHEXIDINE GLUCONATE CLOTH 2 % EX PADS: 6.0000 | MEDICATED_PAD | Freq: Once | CUTANEOUS | Status: DC

## 2022-04-29 ENCOUNTER — Encounter (HOSPITAL_BASED_OUTPATIENT_CLINIC_OR_DEPARTMENT_OTHER): Payer: Self-pay | Admitting: Surgery

## 2022-04-29 ENCOUNTER — Ambulatory Visit (HOSPITAL_BASED_OUTPATIENT_CLINIC_OR_DEPARTMENT_OTHER): Payer: BC Managed Care – PPO | Admitting: Anesthesiology

## 2022-04-29 ENCOUNTER — Ambulatory Visit (HOSPITAL_BASED_OUTPATIENT_CLINIC_OR_DEPARTMENT_OTHER)
Admission: RE | Admit: 2022-04-29 | Discharge: 2022-04-29 | Disposition: A | Discharge status: Home / Self Care | Payer: BC Managed Care – PPO | Attending: Surgery | Admitting: Surgery

## 2022-04-29 ENCOUNTER — Encounter (HOSPITAL_BASED_OUTPATIENT_CLINIC_OR_DEPARTMENT_OTHER)
Admission: RE | Disposition: A | Discharge status: Home / Self Care | Payer: Self-pay | Source: Home / Self Care | Attending: Surgery

## 2022-04-29 ENCOUNTER — Other Ambulatory Visit: Payer: Self-pay

## 2022-04-29 DIAGNOSIS — I10 Essential (primary) hypertension: Secondary | ICD-10-CM | POA: Insufficient documentation

## 2022-04-29 DIAGNOSIS — Z6841 Body Mass Index (BMI) 40.0 and over, adult: Secondary | ICD-10-CM | POA: Diagnosis not present

## 2022-04-29 DIAGNOSIS — M1711 Unilateral primary osteoarthritis, right knee: Secondary | ICD-10-CM

## 2022-04-29 DIAGNOSIS — M069 Rheumatoid arthritis, unspecified: Secondary | ICD-10-CM | POA: Insufficient documentation

## 2022-04-29 DIAGNOSIS — Z6836 Body mass index (BMI) 36.0-36.9, adult: Secondary | ICD-10-CM | POA: Insufficient documentation

## 2022-04-29 DIAGNOSIS — K429 Umbilical hernia without obstruction or gangrene: Secondary | ICD-10-CM | POA: Diagnosis not present

## 2022-04-29 HISTORY — PX: INSERTION OF MESH: SHX5868

## 2022-04-29 HISTORY — PX: UMBILICAL HERNIA REPAIR: SHX196

## 2022-04-29 SURGERY — REPAIR, HERNIA, UMBILICAL, ADULT
Anesthesia: General | Site: Abdomen

## 2022-04-29 MED ORDER — LIDOCAINE HCL (CARDIAC) PF 100 MG/5ML IV SOSY
PREFILLED_SYRINGE | INTRAVENOUS | Status: DC | PRN
  Administered 2022-04-29: 100 mg via INTRATRACHEAL

## 2022-04-29 MED ORDER — FENTANYL CITRATE (PF) 100 MCG/2ML IJ SOLN
INTRAMUSCULAR | Status: AC
  Filled 2022-04-29: qty 2

## 2022-04-29 MED ORDER — PROPOFOL 10 MG/ML IV BOLUS
INTRAVENOUS | Status: DC | PRN
  Administered 2022-04-29: 20 mg via INTRAVENOUS
  Administered 2022-04-29: 200 mg via INTRAVENOUS

## 2022-04-29 MED ORDER — HYDROMORPHONE HCL 1 MG/ML IJ SOLN
INTRAMUSCULAR | Status: AC
  Filled 2022-04-29: qty 0.5

## 2022-04-29 MED ORDER — MIDAZOLAM HCL 2 MG/2ML IJ SOLN
INTRAMUSCULAR | Status: AC
  Filled 2022-04-29: qty 2

## 2022-04-29 MED ORDER — KETOROLAC TROMETHAMINE 30 MG/ML IJ SOLN
INTRAMUSCULAR | Status: AC
  Filled 2022-04-29: qty 1

## 2022-04-29 MED ORDER — OXYCODONE HCL 5 MG PO TABS
5.0000 mg | ORAL_TABLET | Freq: Once | ORAL | Status: AC | PRN
  Administered 2022-04-29: 5 mg via ORAL

## 2022-04-29 MED ORDER — KETOROLAC TROMETHAMINE 30 MG/ML IJ SOLN
INTRAMUSCULAR | Status: DC | PRN
  Administered 2022-04-29: 30 mg via INTRAVENOUS

## 2022-04-29 MED ORDER — DEXAMETHASONE SODIUM PHOSPHATE 4 MG/ML IJ SOLN
INTRAMUSCULAR | Status: DC | PRN
  Administered 2022-04-29: 10 mg via INTRAVENOUS

## 2022-04-29 MED ORDER — PHENYLEPHRINE 80 MCG/ML (10ML) SYRINGE FOR IV PUSH (FOR BLOOD PRESSURE SUPPORT)
PREFILLED_SYRINGE | INTRAVENOUS | Status: AC
  Filled 2022-04-29: qty 10

## 2022-04-29 MED ORDER — ONDANSETRON HCL 4 MG/2ML IJ SOLN
INTRAMUSCULAR | Status: AC
  Filled 2022-04-29: qty 2

## 2022-04-29 MED ORDER — CEFAZOLIN SODIUM-DEXTROSE 2-4 GM/100ML-% IV SOLN
2.0000 g | INTRAVENOUS | Status: AC
  Administered 2022-04-29: 2 g via INTRAVENOUS

## 2022-04-29 MED ORDER — OXYCODONE HCL 5 MG PO TABS: 5.0000 mg | ORAL_TABLET | Freq: Four times a day (QID) | ORAL | 0 refills | Status: DC | PRN

## 2022-04-29 MED ORDER — OXYCODONE HCL 5 MG/5ML PO SOLN: 5.0000 mg | Freq: Once | ORAL | Status: AC | PRN

## 2022-04-29 MED ORDER — DEXAMETHASONE SODIUM PHOSPHATE 10 MG/ML IJ SOLN
INTRAMUSCULAR | Status: AC
  Filled 2022-04-29: qty 1

## 2022-04-29 MED ORDER — BUPIVACAINE-EPINEPHRINE 0.25% -1:200000 IJ SOLN
INTRAMUSCULAR | Status: DC | PRN
  Administered 2022-04-29: 10 mL

## 2022-04-29 MED ORDER — HYDROMORPHONE HCL 1 MG/ML IJ SOLN
0.2500 mg | INTRAMUSCULAR | Status: DC | PRN
  Administered 2022-04-29 (×2): 0.5 mg via INTRAVENOUS

## 2022-04-29 MED ORDER — MEPERIDINE HCL 25 MG/ML IJ SOLN: 6.2500 mg | INTRAMUSCULAR | Status: DC | PRN

## 2022-04-29 MED ORDER — ACETAMINOPHEN 500 MG PO TABS
ORAL_TABLET | ORAL | Status: AC
  Filled 2022-04-29: qty 2

## 2022-04-29 MED ORDER — PROPOFOL 10 MG/ML IV BOLUS
INTRAVENOUS | Status: AC
  Filled 2022-04-29: qty 20

## 2022-04-29 MED ORDER — KETOROLAC TROMETHAMINE 30 MG/ML IJ SOLN: 30.0000 mg | Freq: Once | INTRAMUSCULAR | Status: AC | PRN

## 2022-04-29 MED ORDER — MIDAZOLAM HCL 5 MG/5ML IJ SOLN
INTRAMUSCULAR | Status: DC | PRN
  Administered 2022-04-29: 2 mg via INTRAVENOUS

## 2022-04-29 MED ORDER — LIDOCAINE 2% (20 MG/ML) 5 ML SYRINGE
INTRAMUSCULAR | Status: AC
  Filled 2022-04-29: qty 5

## 2022-04-29 MED ORDER — ONDANSETRON HCL 4 MG/2ML IJ SOLN
INTRAMUSCULAR | Status: DC | PRN
  Administered 2022-04-29: 4 mg via INTRAVENOUS

## 2022-04-29 MED ORDER — ACETAMINOPHEN 500 MG PO TABS
1000.0000 mg | ORAL_TABLET | ORAL | Status: AC
  Administered 2022-04-29: 1000 mg via ORAL

## 2022-04-29 MED ORDER — FENTANYL CITRATE (PF) 100 MCG/2ML IJ SOLN
INTRAMUSCULAR | Status: DC | PRN
  Administered 2022-04-29: 50 ug via INTRAVENOUS
  Administered 2022-04-29 (×2): 25 ug via INTRAVENOUS

## 2022-04-29 MED ORDER — ACETAMINOPHEN 160 MG/5ML PO SOLN: 325.0000 mg | ORAL | Status: DC | PRN

## 2022-04-29 MED ORDER — ACETAMINOPHEN 325 MG PO TABS: 325.0000 mg | ORAL_TABLET | ORAL | Status: DC | PRN

## 2022-04-29 MED ORDER — LACTATED RINGERS IV SOLN: INTRAVENOUS | Status: DC

## 2022-04-29 MED ORDER — CEFAZOLIN SODIUM-DEXTROSE 2-4 GM/100ML-% IV SOLN
INTRAVENOUS | Status: AC
  Filled 2022-04-29: qty 100

## 2022-04-29 MED ORDER — PHENYLEPHRINE HCL (PRESSORS) 10 MG/ML IV SOLN
INTRAVENOUS | Status: DC | PRN
  Administered 2022-04-29 (×3): 80 ug via INTRAVENOUS

## 2022-04-29 MED ORDER — ONDANSETRON HCL 4 MG/2ML IJ SOLN: 4.0000 mg | Freq: Once | INTRAMUSCULAR | Status: DC | PRN

## 2022-04-29 MED ORDER — OXYCODONE HCL 5 MG PO TABS
ORAL_TABLET | ORAL | Status: AC
  Filled 2022-04-29: qty 1

## 2022-04-29 SURGICAL SUPPLY — 58 items
APL PRP STRL LF DISP 70% ISPRP (MISCELLANEOUS) ×1
APL SKNCLS STERI-STRIP NONHPOA (GAUZE/BANDAGES/DRESSINGS) ×1
APL SWBSTK 6 STRL LF DISP (MISCELLANEOUS)
APPLICATOR COTTON TIP 6 STRL (MISCELLANEOUS) IMPLANT
APPLICATOR COTTON TIP 6IN STRL (MISCELLANEOUS)
BENZOIN TINCTURE PRP APPL 2/3 (GAUZE/BANDAGES/DRESSINGS) ×1 IMPLANT
BLADE CLIPPER SURG (BLADE) ×1 IMPLANT
BLADE HEX COATED 2.75 (ELECTRODE) ×1 IMPLANT
BLADE SURG 15 STRL LF DISP TIS (BLADE) ×1 IMPLANT
BLADE SURG 15 STRL SS (BLADE) ×1
CANISTER SUCT 1200ML W/VALVE (MISCELLANEOUS) IMPLANT
CHLORAPREP W/TINT 26 (MISCELLANEOUS) ×1 IMPLANT
COVER BACK TABLE 60X90IN (DRAPES) ×1 IMPLANT
COVER MAYO STAND STRL (DRAPES) ×1 IMPLANT
DRAPE LAPAROTOMY 100X72 PEDS (DRAPES) ×1 IMPLANT
DRAPE UTILITY XL STRL (DRAPES) ×1 IMPLANT
DRSG TEGADERM 4X4.75 (GAUZE/BANDAGES/DRESSINGS) ×1 IMPLANT
ELECT REM PT RETURN 9FT ADLT (ELECTROSURGICAL) ×1
ELECTRODE REM PT RTRN 9FT ADLT (ELECTROSURGICAL) ×1 IMPLANT
GAUZE SPONGE 4X4 12PLY STRL LF (GAUZE/BANDAGES/DRESSINGS) IMPLANT
GLOVE BIO SURGEON STRL SZ 6.5 (GLOVE) IMPLANT
GLOVE BIO SURGEON STRL SZ7 (GLOVE) ×1 IMPLANT
GLOVE BIOGEL PI IND STRL 7.0 (GLOVE) IMPLANT
GLOVE BIOGEL PI IND STRL 7.5 (GLOVE) ×1 IMPLANT
GOWN STRL REUS W/ TWL LRG LVL3 (GOWN DISPOSABLE) ×2 IMPLANT
GOWN STRL REUS W/TWL LRG LVL3 (GOWN DISPOSABLE) ×3
MESH VENTRALEX ST 1-7/10 CRC S (Mesh General) IMPLANT
NDL HYPO 25X1 1.5 SAFETY (NEEDLE) ×1 IMPLANT
NDL SAFETY ECLIP 18X1.5 (MISCELLANEOUS) IMPLANT
NEEDLE HYPO 25X1 1.5 SAFETY (NEEDLE) ×1 IMPLANT
NS IRRIG 1000ML POUR BTL (IV SOLUTION) IMPLANT
PACK BASIN DAY SURGERY FS (CUSTOM PROCEDURE TRAY) ×1 IMPLANT
PENCIL SMOKE EVACUATOR (MISCELLANEOUS) ×1 IMPLANT
SLEEVE SCD COMPRESS KNEE MED (STOCKING) ×1 IMPLANT
SPIKE FLUID TRANSFER (MISCELLANEOUS) IMPLANT
SPONGE GAUZE 2X2 8PLY STRL LF (GAUZE/BANDAGES/DRESSINGS) ×1 IMPLANT
STAPLER VISISTAT 35W (STAPLE) IMPLANT
STRIP CLOSURE SKIN 1/2X4 (GAUZE/BANDAGES/DRESSINGS) ×1 IMPLANT
SUT MNCRL AB 4-0 PS2 18 (SUTURE) ×1 IMPLANT
SUT NOVA 0 T19/GS 22DT (SUTURE) IMPLANT
SUT NOVA NAB DX-16 0-1 5-0 T12 (SUTURE) IMPLANT
SUT NOVA NAB GS-21 1 T12 (SUTURE) IMPLANT
SUT PDS AB 0 CT 36 (SUTURE) IMPLANT
SUT PROLENE 0 CT 2 (SUTURE) IMPLANT
SUT SILK 3 0 TIES 17X18 (SUTURE)
SUT SILK 3-0 18XBRD TIE BLK (SUTURE) IMPLANT
SUT VIC AB 2-0 CT1 27 (SUTURE)
SUT VIC AB 2-0 CT1 TAPERPNT 27 (SUTURE) IMPLANT
SUT VIC AB 3-0 SH 27 (SUTURE) ×1
SUT VIC AB 3-0 SH 27X BRD (SUTURE) ×1 IMPLANT
SUT VIC AB 4-0 BRD 54 (SUTURE) IMPLANT
SUT VIC AB 4-0 SH 18 (SUTURE) IMPLANT
SUT VICRYL 4-0 PS2 18IN ABS (SUTURE) IMPLANT
SYR BULB EAR ULCER 3OZ GRN STR (SYRINGE) IMPLANT
SYR CONTROL 10ML LL (SYRINGE) ×1 IMPLANT
TOWEL GREEN STERILE FF (TOWEL DISPOSABLE) ×1 IMPLANT
TUBE CONNECTING 20X1/4 (TUBING) ×1 IMPLANT
YANKAUER SUCT BULB TIP NO VENT (SUCTIONS) ×1 IMPLANT

## 2022-04-29 NOTE — Anesthesia Procedure Notes (Signed)
Procedure Name: LMA Insertion Date/Time: 04/29/2022 8:45 AM  Performed by: Thornell Mule, CRNAPre-anesthesia Checklist: Patient identified, Emergency Drugs available, Suction available and Patient being monitored Patient Re-evaluated:Patient Re-evaluated prior to induction Oxygen Delivery Method: Circle system utilized Preoxygenation: Pre-oxygenation with 100% oxygen Induction Type: IV induction LMA: LMA inserted LMA Size: 4.0 Number of attempts: 1 Placement Confirmation: positive ETCO2 Tube secured with: Tape Dental Injury: Teeth and Oropharynx as per pre-operative assessment

## 2022-04-29 NOTE — Op Note (Signed)
Indications:  This is a 56 year old male with rheumatoid arthritis and obesity who presents with at least 12 years of a slowly enlarging umbilical hernia.  The patient's job requires a lot of lifting and standing on his feet all day.  The tenderness at his umbilicus has interfered with his ability to work.  He denies any GI obstructive symptoms.  He was referred to [place] to discuss surgical repair of this umbilical hernia   Pre-operative diagnosis:  Umbilical hernia  Post-operative diagnosis:  Same  Procedure:  Umbilical hernia repair with mesh  Findings - 2 cm defect/ 5 cm hernia sac  Procedure Details  The patient was seen again in the Holding Room. The risks, benefits, complications, treatment options, and expected outcomes were discussed with the patient. The possibilities of reaction to medication, pulmonary aspiration, perforation of viscus, bleeding, recurrent infection, the need for additional procedures, and development of a complication requiring transfusion or further operation were discussed with the patient and/or family. There was concurrence with the proposed plan, and informed consent was obtained. The site of surgery was properly noted/marked. The patient was taken to the Operating Room, identified as Anthony Boyd, and the procedure verified as umbilical hernia repair. A Time Out was held and the above information confirmed.  After an adequate level of general anesthesia was obtained, the patient's abdomen was prepped with Chloraprep and draped in sterile fashion.  We made a transverse incision above the umbilicus.  Dissection was carried down to the hernia sac with cautery.  We dissected bluntly around the large hernia sac down to the edge of the fascial defect.  The hernia sac seemed to contain only omentum.   We reduced the hernia sac back into the pre-peritoneal space.  The fascial defect measured 2 cm in diameter.  We cleared the fascia in all directions.  A small Ventralex mesh  was inserted into the pre-peritoneal space and was deployed.  The mesh was secured with four trans-fascial sutures of 0 Novofil.  The fascial defect was closed with multiple interrupted figure-of-eight 1 Novofil sutures.  The base of the umbilicus was tacked down with 3-0 Vicryl.  3-0 Vicryl was used to close the subcutaneous tissues and 4-0 Monocryl was used to close the skin.  Steri-strips and clean dressing were applied.  The patient was extubated and brought to the recovery room in stable condition.  All sponge, instrument, and needle counts were correct prior to closure and at the conclusion of the case.   Estimated Blood Loss: Minimal          Complications: None; patient tolerated the procedure well.         Disposition: PACU - hemodynamically stable.         Condition: stable  Wilmon Arms. Corliss Skains, MD, [facility]  General Surgery   04/29/2022 9:41 AM

## 2022-04-29 NOTE — Interval H&P Note (Signed)
History and Physical Interval Note:  04/29/2022 7:27 AM  Anthony Boyd  has presented today for surgery, with the diagnosis of UMBILICAL HERNIA.  The various methods of treatment have been discussed with the patient and family. After consideration of risks, benefits and other options for treatment, the patient has consented to  Procedure(s): OPEN UMBILICAL HERNIA REPAIR WITH MESH (N/A) as a surgical intervention.  The patient's history has been reviewed, patient examined, no change in status, stable for surgery.  I have reviewed the patient's chart and labs.  Questions were answered to the patient's satisfaction.     Wynona Luna

## 2022-04-29 NOTE — Anesthesia Postprocedure Evaluation (Signed)
Anesthesia Post Note  Patient: Anthony Boyd  Procedure(s) Performed: OPEN UMBILICAL HERNIA REPAIR WITH MESH (Abdomen) INSERTION OF MESH (Abdomen)     Patient location during evaluation: PACU Anesthesia Type: General Level of consciousness: sedated Pain management: pain level controlled Vital Signs Assessment: post-procedure vital signs reviewed and stable Respiratory status: spontaneous breathing Cardiovascular status: stable Postop Assessment: no apparent nausea or vomiting Anesthetic complications: no  No notable events documented.  Last Vitals:  Vitals:   04/29/22 1010 04/29/22 1015  BP:  123/60  Pulse: 78 77  Resp: 13 14  Temp:    SpO2: 95% 92%    Last Pain:  Vitals:   04/29/22 1021  TempSrc:   PainSc: Asleep                 Caren Macadam

## 2022-04-29 NOTE — Anesthesia Preprocedure Evaluation (Addendum)
Anesthesia Evaluation  Patient identified by MRN, date of birth, ID band Patient awake    Reviewed: Allergy & Precautions, NPO status , Patient's Chart, lab work & pertinent test results  History of Anesthesia Complications Negative for: history of anesthetic complications  Airway Mallampati: II  TM Distance: >3 FB Neck ROM: Full    Dental  (+) Teeth Intact   Pulmonary neg pulmonary ROS   Pulmonary exam normal        Cardiovascular hypertension, Pt. on medications Normal cardiovascular exam     Neuro/Psych negative neurological ROS  negative psych ROS   GI/Hepatic negative GI ROS, Neg liver ROS,,,  Endo/Other    Morbid obesity  Renal/GU negative Renal ROS  negative genitourinary   Musculoskeletal  (+) Arthritis , Rheumatoid disorders,    Abdominal  (+) + obese  Peds  Hematology negative hematology ROS (+)   Anesthesia Other Findings   Reproductive/Obstetrics                             Anesthesia Physical Anesthesia Plan  ASA: III  Anesthesia Plan: General   Post-op Pain Management: GA combined w/ Regional for post-op pain   Induction: Intravenous  PONV Risk Score and Plan: 4 or greater and Treatment may vary due to age or medical condition, Ondansetron, Dexamethasone and Midazolam  Airway Management Planned: LMA  Additional Equipment: None  Intra-op Plan:   Post-operative Plan: Extubation in OR  Informed Consent: I have reviewed the patients History and Physical, chart, labs and discussed the procedure including the risks, benefits and alternatives for the proposed anesthesia with the patient or authorized representative who has indicated his/her understanding and acceptance.     Dental advisory given  Plan Discussed with: CRNA  Anesthesia Plan Comments: [facility] confirmed with [place].)        Anesthesia Quick Evaluation

## 2022-04-29 NOTE — Transfer of Care (Signed)
Immediate Anesthesia Transfer of Care Note  Patient: Montez Morita Wyffels  Procedure(s) Performed: OPEN UMBILICAL HERNIA REPAIR WITH MESH (Abdomen)  Patient Location: PACU  Anesthesia Type:General  Level of Consciousness: drowsy and patient cooperative  Airway & Oxygen Therapy: Patient Spontanous Breathing and Patient connected to face mask oxygen  Post-op Assessment: Report given to RN and Post -op Vital signs reviewed and stable  Post vital signs: Reviewed and stable  Last Vitals:  Vitals Value Taken Time  BP    Temp    Pulse 74 04/29/22 0944  Resp 15 04/29/22 0944  SpO2 99 % 04/29/22 0944  Vitals shown include unvalidated device data.  Last Pain:  Vitals:   04/29/22 0710  TempSrc: Oral  PainSc: 0-No pain         Complications: No notable events documented.

## 2022-04-29 NOTE — Discharge Instructions (Addendum)
Next dose of Tylenol due at 1:15 today if needed   [facility]  UMBILICAL HERNIA REPAIR: POST OP INSTRUCTIONS  Always review your discharge instruction sheet given to you by the facility where your surgery was performed. IF YOU HAVE DISABILITY OR FAMILY LEAVE FORMS, YOU MUST BRING THEM TO THE OFFICE FOR PROCESSING.   DO NOT GIVE THEM TO YOUR DOCTOR.  A  prescription for pain medication may be given to you upon discharge.  Take your pain medication as prescribed, if needed.  If narcotic pain medicine is not needed, then you may take acetaminophen (Tylenol) or ibuprofen (Advil) as needed. Take your usually prescribed medications unless otherwise directed. If you need a refill on your pain medication, please contact your pharmacy.  They will contact our office to request authorization. Prescriptions will not be filled after 5 pm or on week-ends. You should follow a light diet the first 24 hours after arrival home, such as soup and crackers, etc.  Be sure to include lots of fluids daily.  Resume your normal diet the day after surgery. Most patients will experience some swelling and bruising around the umbilicus or in the groin and scrotum.  Ice packs and reclining will help.  Swelling and bruising can take several days to resolve.  It is common to experience some constipation if taking pain medication after surgery.  Increasing fluid intake and taking a stool softener (such as Colace) will usually help or prevent this problem from occurring.  A mild laxative (Milk of Magnesia or Miralax) should be taken according to package directions if there are no bowel movements after 48 hours. Unless discharge instructions indicate otherwise, you may remove your bandages 24-48 hours after surgery, and you may shower at that time.  You will have steri-strips (small skin tapes) in place directly over the incision.  These strips should be left on the skin for 7-10 days. ACTIVITIES:  You may resume  regular (light) daily activities beginning the next day--such as daily self-care, walking, climbing stairs--gradually increasing activities as tolerated.  You may have sexual intercourse when it is comfortable.  Refrain from any heavy lifting or straining until approved by your doctor. You may drive when you are no longer taking prescription pain medication, you can comfortably wear a seatbelt, and you can safely maneuver your car and apply brakes. RETURN TO WORK:  2-3 weeks with light duty - no lifting over 15 lbs. You should see your doctor in the office for a follow-up appointment approximately 2-3 weeks after your surgery.  Make sure that you call for this appointment within a day or two after you arrive home to insure a convenient appointment time. OTHER INSTRUCTIONS:   Next dose of Tylenol due at 1:15 today if needed __________________________________________________________________________________________________________________________________________________________________________________________  WHEN TO CALL YOUR DOCTOR: Fever over 101.0 Inability to urinate Nausea and/or vomiting Extreme swelling or bruising Continued bleeding from incision. Increased pain, redness, or drainage from the incision  The clinic staff is available to answer your questions during regular business hours.  Please don't hesitate to call and ask to speak to one of the nurses for clinical concerns.  If you have a medical emergency, go to the nearest emergency room or call 911.  A surgeon from [facility] is always on call at the hospital   [address], [place], [place]  [address] ?  [address] [address], [place], [place]   [address] [phone]    FAX (  [phone] Web site: [email or web address]   Post Anesthesia Home Care Instructions  Activity: Get plenty of rest for the remainder of the day. A responsible individual must stay with you for 24 hours  following the procedure.  For the next 24 hours, DO NOT: -Drive a car -Advertising copywriter -Drink alcoholic beverages -Take any medication unless instructed by your physician -Make any legal decisions or sign important papers.  Meals: Start with liquid foods such as gelatin or soup. Progress to regular foods as tolerated. Avoid greasy, spicy, heavy foods. If nausea and/or vomiting occur, drink only clear liquids until the nausea and/or vomiting subsides. Call your physician if vomiting continues.  Special Instructions/Symptoms: Your throat may feel dry or sore from the anesthesia or the breathing tube placed in your throat during surgery. If this causes discomfort, gargle with warm salt water. The discomfort should disappear within 24 hours.  If you had a scopolamine patch placed behind your ear for the management of post- operative nausea and/or vomiting:  1. The medication in the patch is effective for 72 hours, after which it should be removed.  Wrap patch in a tissue and discard in the trash. Wash hands thoroughly with soap and water. 2. You may remove the patch earlier than 72 hours if you experience unpleasant side effects which may include dry mouth, dizziness or visual disturbances. 3. Avoid touching the patch. Wash your hands with soap and water after contact with the patch.

## 2022-04-30 ENCOUNTER — Encounter (HOSPITAL_BASED_OUTPATIENT_CLINIC_OR_DEPARTMENT_OTHER): Payer: Self-pay | Admitting: Surgery

## 2022-05-14 DIAGNOSIS — M0579 Rheumatoid arthritis with rheumatoid factor of multiple sites without organ or systems involvement: Secondary | ICD-10-CM | POA: Diagnosis not present

## 2022-05-20 DIAGNOSIS — K429 Umbilical hernia without obstruction or gangrene: Secondary | ICD-10-CM | POA: Diagnosis not present

## 2022-05-22 NOTE — Progress Notes (Deleted)
[facility] Clinic Note - Initial Visit   Date: 05/23/2022   Anthony Boyd MRN: [id number] DOB: Jun 16, 1965   Dear Dr Marland KitchenMerrilee Seashore, MD:  Thank you for your kind referral of Anthony Boyd for consultation of ***. Although his history is well known to you, please allow [place] to reiterate it for the purpose of our medical record. The patient was accompanied to the clinic by *** who also provides collateral information.     Anthony Boyd is a 56 y.o. ***-handed male with RA, hypertension, and *** presenting for evaluation of ***.   IMPRESSION/PLAN: ****  Return to clinic in ***  ------------------------------------------------------------- History of present illness: ***  Out-side paper records, electronic medical record, and images have been reviewed where available and summarized as: *** No results found for: "HGBA1C" No results found for: "VITAMINB12" No results found for: "TSH" No results found for: "ESRSEDRATE", "POCTSEDRATE"  Past Medical History:  Diagnosis Date   Allergy    Arthritis    Knees, 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   Hypertension    Osteoporosis    Rheumatoid arthritis(714.0)     Past Surgical History:  Procedure Laterality Date   FOOT SURGERY  1998, 2001   INSERTION OF MESH N/A 04/29/2022   Procedure: INSERTION OF MESH;  Surgeon: Donnie Mesa, MD;  Location: [place];  Service: General;  Laterality: N/A;   KNEE SURGERY  2003   left   TOTAL KNEE ARTHROPLASTY Left 03/04/2019   Procedure: LEFT TOTAL KNEE ARTHROPLASTY;  Surgeon: Mcarthur Rossetti, MD;  Location: [facility];  Service: Orthopedics;  Laterality: Left;   UMBILICAL HERNIA REPAIR N/A 04/29/2022   Procedure: OPEN UMBILICAL HERNIA REPAIR WITH MESH;  Surgeon: Donnie Mesa, MD;  Location: [place];  Service: General;  Laterality: N/A;     Medications:  Outpatient Encounter Medications as of 05/23/2022  Medication Sig    amLODipine (NORVASC) 10 MG tablet Take 10 mg by mouth daily.   celecoxib (CELEBREX) 200 MG capsule Take 200 mg by mouth 2 (two) times daily.   folic acid (FOLVITE) 1 MG tablet Take 1 mg by mouth daily.   golimumab (SIMPONI ARIA) 50 MG/4ML SOLN injection Inject 50 mg into the vein every 8 (eight) weeks.   leflunomide (ARAVA) 20 MG tablet Take 20 mg by mouth daily.   olmesartan-hydrochlorothiazide (BENICAR HCT) 20-12.5 MG tablet Take 1 tablet by mouth daily.   oxyCODONE (OXY IR/ROXICODONE) 5 MG immediate release tablet Take 1 tablet (5 mg total) by mouth every 6 (six) hours as needed for severe pain.   RESTASIS 0.05 % ophthalmic emulsion    tiZANidine (ZANAFLEX) 2 MG tablet Take 2 mg by mouth 3 (three) times daily as needed.   triamcinolone cream (KENALOG) 0.1 % Apply topically daily.   No facility-administered encounter medications on file as of 05/23/2022.    Allergies: No Known Allergies  Family History: Family History  Problem Relation Age of Onset   Hypertension Mother    Hypertension Father     Social History: Social History   Tobacco Use   Smoking status: Never   Smokeless tobacco: Never  Vaping Use   Vaping Use: Never used  Substance Use Topics   Alcohol use: No   Drug use: No   Social History   Social History Narrative   Not on file    Vital Signs:  There were no vitals taken for this visit.   General Medical Exam:  *** General:  Well  appearing, comfortable.   Eyes/ENT: see cranial nerve examination.   Neck:   No carotid bruits. Respiratory:  Clear to auscultation, good air entry bilaterally.   Cardiac:  Regular rate and rhythm, no murmur.   Extremities:  No deformities, edema, or skin discoloration.  Skin:  No rashes or lesions.  Neurological Exam: MENTAL STATUS including orientation to time, place, person, recent and remote memory, attention span and concentration, language, and fund of knowledge is ***normal.  Speech is not dysarthric.  CRANIAL  NERVES: II:  No visual field defects.  Unremarkable fundi.   III-IV-VI: Pupils equal round and reactive to light.  Normal conjugate, extra-ocular eye movements in all directions of gaze.  No nystagmus.  No ptosis***.   V:  Normal facial sensation.    VII:  Normal facial symmetry and movements.   VIII:  Normal hearing and vestibular function.   IX-X:  Normal palatal movement.   XI:  Normal shoulder shrug and head rotation.   XII:  Normal tongue strength and range of motion, no deviation or fasciculation.  MOTOR:  No atrophy, fasciculations or abnormal movements.  No pronator drift.   Upper Extremity:  Right  Left  Deltoid  5/5   5/5   Biceps  5/5   5/5   Triceps  5/5   5/5   Infraspinatus 5/5  5/5  Medial pectoralis 5/5  5/5  Wrist extensors  5/5   5/5   Wrist flexors  5/5   5/5   Finger extensors  5/5   5/5   Finger flexors  5/5   5/5   Dorsal interossei  5/5   5/5   Abductor pollicis  5/5   5/5   Tone (Ashworth scale)  0  0   Lower Extremity:  Right  Left  Hip flexors  5/5   5/5   Hip extensors  5/5   5/5   Adductor 5/5  5/5  Abductor 5/5  5/5  Knee flexors  5/5   5/5   Knee extensors  5/5   5/5   Dorsiflexors  5/5   5/5   Plantarflexors  5/5   5/5   Toe extensors  5/5   5/5   Toe flexors  5/5   5/5   Tone (Ashworth scale)  0  0   MSRs:  Right        Left                  brachioradialis 2+  2+  biceps 2+  2+  triceps 2+  2+  patellar 2+  2+  ankle jerk 2+  2+  Hoffman no  no  plantar response down  down   SENSORY:  Normal and symmetric perception of light touch, pinprick, vibration, and proprioception.  Romberg's sign absent.   COORDINATION/GAIT: Normal finger-to- nose-finger and heel-to-shin.  Intact rapid alternating movements bilaterally.  Able to rise from a chair without using arms.  Gait narrow based and stable. Tandem and stressed gait intact.    ***   Thank you for allowing me to participate in patient's care.  If I can answer any additional  questions, I would be pleased to do so.    Sincerely,    Marlis Oldaker K. Allena Katz, DO

## 2022-05-23 ENCOUNTER — Ambulatory Visit: Payer: BC Managed Care – PPO | Admitting: Neurology

## 2022-05-28 ENCOUNTER — Encounter: Payer: Self-pay | Admitting: Neurology

## 2022-05-28 ENCOUNTER — Ambulatory Visit: Payer: BC Managed Care – PPO | Admitting: Neurology

## 2022-05-28 VITALS — BP 174/84 | HR 101 | Ht 63.0 in | Wt 231.8 lb

## 2022-05-28 DIAGNOSIS — R29898 Other symptoms and signs involving the musculoskeletal system: Secondary | ICD-10-CM

## 2022-05-28 DIAGNOSIS — R292 Abnormal reflex: Secondary | ICD-10-CM | POA: Diagnosis not present

## 2022-05-28 DIAGNOSIS — R202 Paresthesia of skin: Secondary | ICD-10-CM | POA: Diagnosis not present

## 2022-05-28 NOTE — Progress Notes (Signed)
[facility] Clinic Note - Initial Visit   Date: 05/28/2022   Anthony Boyd MRN: [id number] DOB: [birth year]   Dear Dr. Kathi Ludwig:  Thank you for your kind referral of Anthony Boyd [facility] for consultation of right hand numbness. Although his history is well known to you, please allow [place] to reiterate it for the purpose of our medical record. The patient was accompanied to the clinic by self.    Anthony Boyd is a 55 y.o. right-handed male rheumatoid arthritis and hypertension presenting for evaluation of right finger numbness.   IMPRESSION/PLAN: Right hand paresthesias and bilateral hand weakness with exam shows hyperreflexia is concerning for cervical canal stenosis.  He has history of RA which can predispose him to have cervical spondylosis  - MRI cervical spine without contrast  - If there is no cervical pathology to explain symptoms, next step is NCS/EMG  Further recommendations pending results.  ------------------------------------------------------------- History of present illness: Starting around August 2023, he began having numbness involving the tip of the fifth finger which has constant.  He denies weakness in the hand, neck pain, radicular pain, or similar symptoms on the left. He does not recall any specific triggers. He has a tendency to sleep on his side with arms flexed.   Past Medical History:  Diagnosis Date   Allergy    Arthritis    Knees, 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   Hypertension    Osteoporosis    Rheumatoid arthritis(714.0)     Past Surgical History:  Procedure Laterality Date   FOOT SURGERY  1998, 2001   INSERTION OF MESH N/A 04/29/2022   Procedure: INSERTION OF MESH;  Surgeon: Manus Rudd, MD;  Location: [facility];  Service: General;  Laterality: N/A;   KNEE SURGERY  2003   left   TOTAL KNEE ARTHROPLASTY Left 03/04/2019   Procedure: LEFT TOTAL KNEE ARTHROPLASTY;  Surgeon: Kathryne Hitch, MD;  Location: [facility];   Service: Orthopedics;  Laterality: Left;   UMBILICAL HERNIA REPAIR N/A 04/29/2022   Procedure: OPEN UMBILICAL HERNIA REPAIR WITH MESH;  Surgeon: Manus Rudd, MD;  Location: [facility];  Service: General;  Laterality: N/A;     Medications:  Outpatient Encounter Medications as of 05/28/2022  Medication Sig   amLODipine (NORVASC) 10 MG tablet Take 10 mg by mouth daily.   celecoxib (CELEBREX) 200 MG capsule Take 200 mg by mouth 2 (two) times daily.   folic acid (FOLVITE) 1 MG tablet Take 1 mg by mouth daily.   golimumab (SIMPONI ARIA) 50 MG/4ML SOLN injection Inject 50 mg into the vein every 8 (eight) weeks.   leflunomide (ARAVA) 20 MG tablet Take 20 mg by mouth daily.   olmesartan-hydrochlorothiazide (BENICAR HCT) 20-12.5 MG tablet Take 1 tablet by mouth daily.   RESTASIS 0.05 % ophthalmic emulsion    tiZANidine (ZANAFLEX) 2 MG tablet Take 2 mg by mouth 3 (three) times daily as needed.   triamcinolone cream (KENALOG) 0.1 % Apply topically daily.   [DISCONTINUED] oxyCODONE (OXY IR/ROXICODONE) 5 MG immediate release tablet Take 1 tablet (5 mg total) by mouth every 6 (six) hours as needed for severe pain.   No facility-administered encounter medications on file as of 05/28/2022.    Allergies: No Known Allergies  Family History: Family History  Problem Relation Age of Onset   Hypertension Mother    Hypertension Father     Social History: Social History   Tobacco Use   Smoking status: Never   Smokeless tobacco:  Never  Vaping Use   Vaping Use: Never used  Substance Use Topics   Alcohol use: No   Drug use: No   Social History   Social History Narrative   Not on file    Vital Signs:  BP (!) 174/84   Pulse (!) 101   Ht 5\' 3"  (1.6 m)   Wt 231 lb 12.8 oz (105.1 kg)   SpO2 98%   BMI 41.06 kg/m   Neurological Exam: MENTAL STATUS including orientation to time, place, person, recent and remote memory, attention span and concentration, language, and fund of  knowledge is normal.  Speech is not dysarthric.  CRANIAL NERVES: II:  No visual field defects.    III-IV-VI: Pupils equal round and reactive to light.  Normal conjugate, extra-ocular eye movements in all directions of gaze.  No nystagmus.  No ptosis.   V:  Normal facial sensation.    VII:  Normal facial symmetry and movements.   VIII:  Normal hearing and vestibular function.   IX-X:  Normal palatal movement.   XI:  Normal shoulder shrug and head rotation.   XII:  Normal tongue strength and range of motion, no deviation or fasciculation.  MOTOR:  No atrophy, fasciculations or abnormal movements.  No pronator drift.   Upper Extremity:  Right  Left  Deltoid  5/5   5/5   Biceps  5/5   5/5   Triceps  5/5   5/5   Wrist extensors  5/5   5/5   Wrist flexors  5/5   5/5   Finger extensors  4/5   4-/5   Finger flexors  4/5   4-/5   Dorsal interossei  4/5   4-/5   Abductor pollicis  4/5   4-/5   Tone (Ashworth scale)  0  0   Lower Extremity:  Right  Left  Hip flexors  5/5   5/5   Knee flexors  5/5   5/5   Knee extensors  5/5   5/5   Dorsiflexors  5/5   5/5   Plantarflexors  5/5   5/5   Toe extensors  5/5   5/5   Toe flexors  5/5   5/5   Tone (Ashworth scale)  0  0    MSRs:                                           Right        Left brachioradialis 3+  3+  biceps 3+  3+  triceps 3+  3+  patellar 3+  3+  ankle jerk 2+  2+  Hoffman no  no  plantar response down  down   SENSORY:  Normal and symmetric perception of light touch, pinprick, vibration, and temperature  COORDINATION/GAIT: Normal finger-to- nose-finger.  Intact rapid alternating movements bilaterally.  Gait narrow based and stable. Tandem and stressed gait intact.     Thank you for allowing me to participate in patient's care.  If I can answer any additional questions, I would be pleased to do so.    Sincerely,    Hagar Sadiq K. Posey Pronto, DO

## 2022-05-28 NOTE — Patient Instructions (Signed)
MRI cervical spine without contrast to further evaluate your symptoms.

## 2022-06-18 ENCOUNTER — Encounter: Payer: Self-pay | Admitting: Podiatry

## 2022-06-18 ENCOUNTER — Ambulatory Visit: Payer: BC Managed Care – PPO | Admitting: Podiatry

## 2022-06-18 DIAGNOSIS — M7751 Other enthesopathy of right foot: Secondary | ICD-10-CM

## 2022-06-18 DIAGNOSIS — M722 Plantar fascial fibromatosis: Secondary | ICD-10-CM | POA: Diagnosis not present

## 2022-06-18 MED ORDER — TRIAMCINOLONE ACETONIDE 10 MG/ML IJ SUSP
20.0000 mg | Freq: Once | INTRAMUSCULAR | Status: AC
  Administered 2022-06-18: 20 mg

## 2022-06-19 ENCOUNTER — Ambulatory Visit: Payer: BC Managed Care – PPO | Admitting: Podiatry

## 2022-06-19 NOTE — Progress Notes (Signed)
Patient states he is to subjective:   Patient ID: Anthony Boyd, male   DOB: 57 y.o.   MRN: [id number]   HPI A lot of pain in his right ankle and then in the mid arch left foot.  States the right ankle been giving him trouble he was doing well but then when he started work again after having hernia repair it became very sore   ROS      Objective:  Physical Exam  Neuro vas status intact with significant flatfoot deformity right inflammation posterior tibial tendon right as it comes underneath the medial arch and into the navicular with discomfort in the distal plantar fascia left     Assessment:  Posterior tibial tendinitis right with inflammation along with distal plantar fasciitis left which may be compensatory     Plan:  Patient is having a CT scan on for the right and we will get better understanding at 1 point probable surgical intervention will be necessary but were trying to get him through a period of time as he just has been off of work with his hernia surgery.  Today I did go ahead and I reinjected the posterior tib after explaining risk 3 mg Dexasone Kenalog 5 mg Xylocaine and the distal arch left 3 mg Kenalog 5 mg Xylocaine.  Patient will be seen back to reevaluate in February and we will have the results of CT scan and make a decision long-term for what will be best for Anthony Boyd

## 2022-06-20 ENCOUNTER — Ambulatory Visit
Admission: RE | Admit: 2022-06-20 | Discharge: 2022-06-20 | Disposition: A | Discharge status: Home / Self Care | Payer: BC Managed Care – PPO | Source: Ambulatory Visit | Attending: Neurology | Admitting: Neurology

## 2022-06-20 DIAGNOSIS — M47812 Spondylosis without myelopathy or radiculopathy, cervical region: Secondary | ICD-10-CM | POA: Diagnosis not present

## 2022-06-20 DIAGNOSIS — R202 Paresthesia of skin: Secondary | ICD-10-CM

## 2022-06-20 DIAGNOSIS — R29898 Other symptoms and signs involving the musculoskeletal system: Secondary | ICD-10-CM

## 2022-06-20 DIAGNOSIS — M4802 Spinal stenosis, cervical region: Secondary | ICD-10-CM | POA: Diagnosis not present

## 2022-06-25 ENCOUNTER — Telehealth: Payer: Self-pay

## 2022-06-25 DIAGNOSIS — Z79899 Other long term (current) drug therapy: Secondary | ICD-10-CM | POA: Diagnosis not present

## 2022-06-25 DIAGNOSIS — R202 Paresthesia of skin: Secondary | ICD-10-CM

## 2022-06-25 DIAGNOSIS — R29898 Other symptoms and signs involving the musculoskeletal system: Secondary | ICD-10-CM

## 2022-06-25 DIAGNOSIS — M0579 Rheumatoid arthritis with rheumatoid factor of multiple sites without organ or systems involvement: Secondary | ICD-10-CM | POA: Diagnosis not present

## 2022-06-25 DIAGNOSIS — M25561 Pain in right knee: Secondary | ICD-10-CM | POA: Diagnosis not present

## 2022-06-25 DIAGNOSIS — M79643 Pain in unspecified hand: Secondary | ICD-10-CM | POA: Diagnosis not present

## 2022-06-25 NOTE — Telephone Encounter (Signed)
Patient is returning call about MRI results °

## 2022-06-25 NOTE — Telephone Encounter (Signed)
Called patient and informed him of results and recommendations of MRI. Pt would like to proceed with EMG as recommended.

## 2022-06-26 ENCOUNTER — Telehealth: Payer: Self-pay | Admitting: Neurology

## 2022-06-26 NOTE — Telephone Encounter (Signed)
Called pt and he wanted to know what a EMG was and what it was looking for. I answered his questions and he understood.

## 2022-06-26 NOTE — Telephone Encounter (Signed)
Patient would like a call back. I called and made his appt for an EMG but it seemed he has some questions

## 2022-07-09 DIAGNOSIS — M0579 Rheumatoid arthritis with rheumatoid factor of multiple sites without organ or systems involvement: Secondary | ICD-10-CM | POA: Diagnosis not present

## 2022-07-18 ENCOUNTER — Ambulatory Visit: Payer: BC Managed Care – PPO | Admitting: Podiatry

## 2022-07-18 ENCOUNTER — Encounter: Payer: Self-pay | Admitting: Podiatry

## 2022-07-18 ENCOUNTER — Encounter: Payer: BC Managed Care – PPO | Admitting: Neurology

## 2022-07-18 DIAGNOSIS — I10 Essential (primary) hypertension: Secondary | ICD-10-CM | POA: Insufficient documentation

## 2022-07-18 DIAGNOSIS — Z0000 Encounter for general adult medical examination without abnormal findings: Secondary | ICD-10-CM | POA: Insufficient documentation

## 2022-07-18 DIAGNOSIS — M76821 Posterior tibial tendinitis, right leg: Secondary | ICD-10-CM

## 2022-07-18 DIAGNOSIS — T148XXA Other injury of unspecified body region, initial encounter: Secondary | ICD-10-CM | POA: Diagnosis not present

## 2022-07-18 DIAGNOSIS — M0579 Rheumatoid arthritis with rheumatoid factor of multiple sites without organ or systems involvement: Secondary | ICD-10-CM | POA: Insufficient documentation

## 2022-07-18 DIAGNOSIS — K429 Umbilical hernia without obstruction or gangrene: Secondary | ICD-10-CM | POA: Insufficient documentation

## 2022-07-18 DIAGNOSIS — M7751 Other enthesopathy of right foot: Secondary | ICD-10-CM

## 2022-07-18 DIAGNOSIS — R7303 Prediabetes: Secondary | ICD-10-CM | POA: Insufficient documentation

## 2022-07-18 MED ORDER — TRIAMCINOLONE ACETONIDE 10 MG/ML IJ SUSP
10.0000 mg | Freq: Once | INTRAMUSCULAR | Status: AC
  Administered 2022-07-18: 10 mg

## 2022-07-18 NOTE — Progress Notes (Signed)
Subjective:   Patient ID: Anthony Boyd, male   DOB: 57 y.o.   MRN: [id number]   HPI Patient states his right ankle has been killing him and its really been mostly around posterior tibial tendon with moderate flattening it has been going on for around 3 years and if he tries to be active it gets bad.  States the last injection gave him temporary relief with reoccurrence   ROS      Objective:  Physical Exam  Neurovascular status intact with exquisite discomfort right posterior tibial tendon as it comes under medial malleolus with reduction of the arch height and also moderate discomfort into the subtalar joint ankle joint     Assessment:  Chronic problems posterior tibial tendon with flatter structural arch with possibility for tear of the tendon or other bony or soft tissue pathology     Plan:  H&P reviewed and agree to go ahead and order an MRI of this to try to ascertain the tendon and what is going on with the consideration that this will require surgery once we get the understanding from the MRI.  Ordered today educated him on this and he does have also inflammation underneath the big toe right with what appears to be fluid buildup so as a secondary procedure I did do a careful injection of the inner phalangeal joint plantar right hallux into the plantar capsule with sterile dress

## 2022-07-21 ENCOUNTER — Ambulatory Visit: Payer: BC Managed Care – PPO | Admitting: Podiatry

## 2022-07-24 ENCOUNTER — Ambulatory Visit: Payer: BC Managed Care – PPO | Admitting: Neurology

## 2022-07-24 DIAGNOSIS — G5621 Lesion of ulnar nerve, right upper limb: Secondary | ICD-10-CM | POA: Diagnosis not present

## 2022-07-24 DIAGNOSIS — R29898 Other symptoms and signs involving the musculoskeletal system: Secondary | ICD-10-CM

## 2022-07-24 NOTE — Procedures (Signed)
[facility]  [place], [place]  [place], [place] [address] Tel: [phone] Fax: [phone] Test Date:  07/24/2022  Patient: Anthony Boyd DOB: 08-09-1965 Physician: Narda Amber, DO  Sex: Male Height: 5' 3"$  Ref Phys: Narda Amber, DO  ID#: [id number]   Technician:    History: This is a 57 year old referred for evaluation of numbness involving the right fifth finger.  NCV & EMG Findings: Extensive electrodiagnostic testing of the right upper extremity and additional studies of the left shows:  Bilateral median and left ulnar sensory responses are within normal limits.  Right ulnar sensory response shows prolonged latency (3.2 ms) and reduced amplitude (3.7 V).   Bilateral median and left ulnar motor responses are within normal limits.  Right ulnar motor response shows slowed conduction velocity across the elbow (A Elbow-B Elbow, 43 m/s).  There is no evidence of active or chronic motor axonal loss changes affecting any of the tested muscles.  Motor unit configuration and recruitment pattern is within normal limits.    Impression: Right ulnar neuropathy with slowing across the elbow, with demyelinating and axonal features, moderate.   ___________________________ Narda Amber, DO    Nerve Conduction Studies   Stim Site NR Peak (ms) Norm Peak (ms) O-P Amp (V) Norm O-P Amp  Left Median Anti Sensory (2nd Digit)  32 C  Wrist    3.1 <3.6 19.2 >15  Right Median Anti Sensory (2nd Digit)  32 C  Wrist    3.1 <3.6 17.5 >15  Left Ulnar Anti Sensory (5th Digit)  32 C  Wrist    3.0 <3.1 16.9 >10  Right Ulnar Anti Sensory (5th Digit)  32 C  Wrist    *3.2 <3.1 *3.7 >10     Stim Site NR Onset (ms) Norm Onset (ms) O-P Amp (mV) Norm O-P Amp Site1 Site2 Delta-0 (ms) Dist (cm) Vel (m/s) Norm Vel (m/s)  Left Median Motor (Abd Poll Brev)  32 C  Wrist    3.0 <4.0 6.6 >6 Elbow Wrist 5.2 29.0 56 >50  Elbow    8.2  6.1         Right Median Motor (Abd Poll Brev)  32  C  Wrist    3.1 <4.0 8.0 >6 Elbow Wrist 5.3 29.0 55 >50  Elbow    8.4  7.5         Left Ulnar Motor (Abd Dig Minimi)  32 C  Wrist    2.6 <3.1 7.6 >7 B Elbow Wrist 3.9 22.0 56 >50  B Elbow    6.5  6.3  A Elbow B Elbow 1.8 10.0 56 >50  A Elbow    8.3  6.0         Right Ulnar Motor (Abd Dig Minimi)  32 C  Wrist    2.7 <3.1 8.2 >7 B Elbow Wrist 4.1 21.0 51 >50  B Elbow    6.8  7.8  A Elbow B Elbow 2.3 10.0 *43 >50  A Elbow    9.1  6.9          Electromyography   Side Muscle Ins.Act Fibs Fasc Recrt Amp Dur Poly Activation Comment  Right 1stDorInt Nml Nml Nml Nml Nml Nml Nml Nml N/A  Right AbdDigitMin Nml Nml Nml Nml Nml Nml Nml Nml N/A  Right FlexCarpUln Nml Nml Nml Nml Nml Nml Nml Nml   Right PronatorTeres Nml Nml Nml Nml Nml Nml Nml Nml N/A  Right Biceps Nml Nml Nml Nml Nml Nml  Nml Nml N/A  Right Triceps Nml Nml Nml Nml Nml Nml Nml Nml N/A  Right Deltoid Nml Nml Nml Nml Nml Nml Nml Nml N/A      Waveforms:

## 2022-07-24 NOTE — Progress Notes (Signed)
Follow-up Visit   Date: 07/24/2022    QUILL GRINDER MRN: [id number] DOB: 08/15/1965    Anthony Boyd is a 57 y.o. right-handed male with RA and HTN returning to the clinic for follow-up of right hand numbness.  The patient was accompanied to the clinic by self.  IMPRESSION/PLAN: Right ulnar neuropathy at the elbow, moderate.  NCS/EMG findings discussed. MRI cervical spine show moderate right foraminal narrowing at C5-6 and C3-4, however, this would not explain his symptoms.   - Strategies to reduce nerve compression and stretching across the elbow discussed  - Start using a soft elbow pad  - Avoid hyperflexion at the elbow  - If no improvement with conservative therapies in 2-3 months, he will return for follow-up.  --------------------------------------------- History of present illness: Starting around August 2023, he began having numbness involving the tip of the fifth finger which has constant. He denies weakness in the hand, neck pain, radicular pain, or similar symptoms on the left. He does not recall any specific triggers. He has a tendency to sleep on his side with arms flexed.   UPDATE 07/24/2022:  He is here for EDX of the arms.  He continues to have numbness involving the 5th finger which is constant on the right.  He has noticed that he tends to rest his elbow on surface and in fact, mentions that the armrest in his car is worn down where he rests his elbow.  He is also a side sleeper and tends to have arms flexed when sleeping.   Medications:  Current Outpatient Medications on File Prior to Visit  Medication Sig Dispense Refill   amLODipine (NORVASC) 10 MG tablet Take 10 mg by mouth daily.     celecoxib (CELEBREX) 200 MG capsule Take 200 mg by mouth 2 (two) times daily.  2   folic acid (FOLVITE) 1 MG tablet Take 1 mg by mouth daily.  3   golimumab (SIMPONI ARIA) 50 MG/4ML SOLN injection Inject 50 mg into the vein every 8 (eight) weeks.     leflunomide (ARAVA)  20 MG tablet Take 20 mg by mouth daily.     olmesartan-hydrochlorothiazide (BENICAR HCT) 20-12.5 MG tablet Take 1 tablet by mouth daily.     RESTASIS 0.05 % ophthalmic emulsion      tiZANidine (ZANAFLEX) 2 MG tablet Take 2 mg by mouth 3 (three) times daily as needed.     triamcinolone cream (KENALOG) 0.1 % Apply topically daily.     No current facility-administered medications on file prior to visit.    Allergies: No Known Allergies  Vital Signs:  There were no vitals taken for this visit.   Neurological Exam: MENTAL STATUS including orientation to time, place, person, recent and remote memory, attention span and concentration, language, and fund of knowledge is normal.  Speech is not dysarthric.  CRANIAL NERVES: Normal conjugate, extra-ocular eye movements in all directions of gaze.  No ptosis.  Face is symmetric. Palate elevates symmetrically.  Tongue is midline.  MOTOR:  Motor strength is 5/5 in all extremities, except right intrinsic hand muscles 4/5.  No atrophy, fasciculations or abnormal movements.   COORDINATION/GAIT:  Gait narrow based and stable.   Data: NCS/EMG of the arms 07/24/2022: Right ulnar neuropathy with slowing across the elbow, with demyelinating and axonal features, moderate.   Thank you for allowing me to participate in patient's care.  If I can answer any additional questions, I would be pleased to do so.    Sincerely,  Brody Kump K. Posey Pronto, DO

## 2022-08-11 DIAGNOSIS — H16141 Punctate keratitis, right eye: Secondary | ICD-10-CM | POA: Diagnosis not present

## 2022-08-11 DIAGNOSIS — H0288A Meibomian gland dysfunction right eye, upper and lower eyelids: Secondary | ICD-10-CM | POA: Diagnosis not present

## 2022-08-11 DIAGNOSIS — H0288B Meibomian gland dysfunction left eye, upper and lower eyelids: Secondary | ICD-10-CM | POA: Diagnosis not present

## 2022-08-11 DIAGNOSIS — H16223 Keratoconjunctivitis sicca, not specified as Sjogren's, bilateral: Secondary | ICD-10-CM | POA: Diagnosis not present

## 2022-08-20 ENCOUNTER — Ambulatory Visit
Admission: RE | Admit: 2022-08-20 | Discharge: 2022-08-20 | Disposition: A | Discharge status: Home / Self Care | Payer: BC Managed Care – PPO | Source: Ambulatory Visit | Attending: Podiatry | Admitting: Podiatry

## 2022-08-20 DIAGNOSIS — M76821 Posterior tibial tendinitis, right leg: Secondary | ICD-10-CM

## 2022-08-20 DIAGNOSIS — T148XXA Other injury of unspecified body region, initial encounter: Secondary | ICD-10-CM

## 2022-08-20 DIAGNOSIS — M65871 Other synovitis and tenosynovitis, right ankle and foot: Secondary | ICD-10-CM | POA: Diagnosis not present

## 2022-08-20 DIAGNOSIS — R600 Localized edema: Secondary | ICD-10-CM | POA: Diagnosis not present

## 2022-08-25 NOTE — Progress Notes (Signed)
Looks like could be interesting case. Let me know if want to see him

## 2022-08-26 DIAGNOSIS — Z0000 Encounter for general adult medical examination without abnormal findings: Secondary | ICD-10-CM | POA: Diagnosis not present

## 2022-08-26 DIAGNOSIS — Z125 Encounter for screening for malignant neoplasm of prostate: Secondary | ICD-10-CM | POA: Diagnosis not present

## 2022-08-26 NOTE — Progress Notes (Signed)
No, other foot is fine. I agree on the fusion. Go ahead and have them schedule with you

## 2022-08-27 ENCOUNTER — Telehealth: Payer: Self-pay | Admitting: Podiatry

## 2022-08-27 NOTE — Telephone Encounter (Signed)
Called pt 2xs & lvm to schedule a appointment for Sx consult.

## 2022-09-02 DIAGNOSIS — I10 Essential (primary) hypertension: Secondary | ICD-10-CM | POA: Diagnosis not present

## 2022-09-02 DIAGNOSIS — M0579 Rheumatoid arthritis with rheumatoid factor of multiple sites without organ or systems involvement: Secondary | ICD-10-CM | POA: Diagnosis not present

## 2022-09-02 DIAGNOSIS — R7303 Prediabetes: Secondary | ICD-10-CM | POA: Diagnosis not present

## 2022-09-02 DIAGNOSIS — E782 Mixed hyperlipidemia: Secondary | ICD-10-CM | POA: Diagnosis not present

## 2022-09-02 DIAGNOSIS — Z0000 Encounter for general adult medical examination without abnormal findings: Secondary | ICD-10-CM | POA: Diagnosis not present

## 2022-09-03 ENCOUNTER — Ambulatory Visit (HOSPITAL_BASED_OUTPATIENT_CLINIC_OR_DEPARTMENT_OTHER): Payer: BC Managed Care – PPO | Admitting: Student

## 2022-09-03 ENCOUNTER — Ambulatory Visit (INDEPENDENT_AMBULATORY_CARE_PROVIDER_SITE_OTHER): Payer: BC Managed Care – PPO

## 2022-09-03 ENCOUNTER — Other Ambulatory Visit (HOSPITAL_BASED_OUTPATIENT_CLINIC_OR_DEPARTMENT_OTHER): Payer: Self-pay | Admitting: Student

## 2022-09-03 DIAGNOSIS — S8991XA Unspecified injury of right lower leg, initial encounter: Secondary | ICD-10-CM | POA: Diagnosis not present

## 2022-09-03 DIAGNOSIS — M25561 Pain in right knee: Secondary | ICD-10-CM | POA: Diagnosis not present

## 2022-09-03 DIAGNOSIS — M0579 Rheumatoid arthritis with rheumatoid factor of multiple sites without organ or systems involvement: Secondary | ICD-10-CM | POA: Diagnosis not present

## 2022-09-03 DIAGNOSIS — M1711 Unilateral primary osteoarthritis, right knee: Secondary | ICD-10-CM | POA: Diagnosis not present

## 2022-09-03 MED ORDER — LIDOCAINE HCL 1 % IJ SOLN
4.0000 mL | INTRAMUSCULAR | Status: AC | PRN
  Administered 2022-09-03: 4 mL

## 2022-09-03 MED ORDER — TRIAMCINOLONE ACETONIDE 40 MG/ML IJ SUSP
2.0000 mL | INTRAMUSCULAR | Status: AC | PRN
  Administered 2022-09-03: 2 mL via INTRA_ARTICULAR

## 2022-09-03 NOTE — Progress Notes (Signed)
   Procedure Note  Patient: Anthony Boyd             Date of Birth: Apr 02, 1966           MRN: [id number]             Visit Date: 09/03/2022  Procedures: Visit Diagnoses:  1. Unilateral primary osteoarthritis, right knee     Large Joint Inj: R knee on 09/03/2022 4:35 PM Indications: pain Details: 22 G 1.5 in needle, ultrasound-guided anterior approach Medications: 4 mL lidocaine 1 %; 2 mL triamcinolone acetonide 40 MG/ML Outcome: tolerated well, no immediate complications Patient was prepped and draped in the usual sterile fashion.

## 2022-09-03 NOTE — Progress Notes (Signed)
Chief Complaint: Right knee pain     History of Present Illness:    Anthony Boyd is a 57 y.o. male with history of significant osteoarthritis presenting for evaluation of pain in his right knee.  He states that a few days ago he tripped and fell on the front of his knee, and has since had slightly increased pain and some swelling.  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  He is on Celebrex was also been using a brace for the past couple days which have helped his symptoms.  He is being followed by Dr. Ninfa Linden for significant tricompartmental osteoarthritis of his right knee.  He states that he is in need of a right ankle fusion which they would like to complete before moving forward with a right TKA.  He said this is roughly scheduled for October.  He did receive a Synvisc injection last July which gave him some good relief.  He does not recall any other injections since that point.  Surgical History:   Left TKA 2020  PMH/PSH/Family History/Social History/Meds/Allergies:    Past Medical History:  Diagnosis Date   Allergy    Arthritis    Knees, 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   Hypertension    Osteoporosis    Rheumatoid arthritis(714.0)    Past Surgical History:  Procedure Laterality Date   FOOT SURGERY  1998, 2001   INSERTION OF MESH N/A 04/29/2022   Procedure: INSERTION OF MESH;  Surgeon: Donnie Mesa, MD;  Location: [facility];  Service: General;  Laterality: N/A;   KNEE SURGERY  2003   left   TOTAL KNEE ARTHROPLASTY Left 03/04/2019   Procedure: LEFT TOTAL KNEE ARTHROPLASTY;  Surgeon: Mcarthur Rossetti, MD;  Location: [facility];  Service: Orthopedics;  Laterality: Left;   UMBILICAL HERNIA REPAIR N/A 04/29/2022   Procedure: OPEN UMBILICAL HERNIA REPAIR WITH MESH;  Surgeon: Donnie Mesa, MD;  Location: [facility];  Service: General;  Laterality: N/A;   Social History    Socioeconomic History   Marital status: Significant Other    Spouse name: Not on file   Number of children: Not on file   Years of education: Not on file   Highest education level: Not on file  Occupational History   Not on file  Tobacco Use   Smoking status: Never   Smokeless tobacco: Never  Vaping Use   Vaping Use: Never used  Substance and Sexual Activity   Alcohol use: No   Drug use: No   Sexual activity: Not on file  Other Topics Concern   Not on file  Social History Narrative   Not on file   Social Determinants of Health   Financial Resource Strain: Not on file  Food Insecurity: Not on file  Transportation Needs: Not on file  Physical Activity: Not on file  Stress: Not on file  Social Connections: Not on file   Family History  Problem Relation Age of Onset   Hypertension Mother    Hypertension Father    No Known Allergies Current Outpatient Medications  Medication Sig Dispense Refill   amLODipine (NORVASC) 10 MG tablet Take 10 mg by mouth daily.     celecoxib (CELEBREX) 200 MG capsule Take 200 mg by  mouth 2 (two) times daily.  2   folic acid (FOLVITE) 1 MG tablet Take 1 mg by mouth daily.  3   golimumab (SIMPONI ARIA) 50 MG/4ML SOLN injection Inject 50 mg into the vein every 8 (eight) weeks.     leflunomide (ARAVA) 20 MG tablet Take 20 mg by mouth daily.     olmesartan-hydrochlorothiazide (BENICAR HCT) 20-12.5 MG tablet Take 1 tablet by mouth daily.     RESTASIS 0.05 % ophthalmic emulsion      tiZANidine (ZANAFLEX) 2 MG tablet Take 2 mg by mouth 3 (three) times daily as needed.     triamcinolone cream (KENALOG) 0.1 % Apply topically daily.     No current facility-administered medications for this visit.   No results found.  Review of Systems:   A ROS was performed including pertinent positives and negatives as documented in the HPI.  Physical Exam :   Constitutional: NAD and appears stated age Neurological: Alert and oriented Psych: Appropriate  affect and cooperative There were no vitals taken for this visit.   Comprehensive Musculoskeletal Exam:    Right knee has mild soft tissue edema.  Significant crepitus noted with flexion and extension.  Strength 5/5 with knee flexion and extension.  Passive and active range of motion 0-90 degrees.  Patella mobile and nontender.  Knee joint stable without laxity with varus or valgus stress. No large effusion present.  Imaging:   Xray (right knee 4 views): Significant tricompartmental osteoarthritis that is bone-on-bone in the medial compartment.  No evidence of fracture or acute bony abnormality.   I personally reviewed and interpreted the radiographs.   Assessment:   57 y.o. male presenting for evaluation of right knee pain.  I do not suspect any significant injury from his fall a few days ago.  He has not had an injection since July of last year but has had good relief with both cortisone and gel injections.  I recommend doing an intra-articular cortisone injection today to get him some relief and patient would like to proceed.  Procedure was performed today in the clinic and patient tolerated well without medications.  Advised him to follow back up with Dr. Ninfa Linden as he can have repeat injections if needed while waiting to have his ankle addressed.  Plan :    -Follow-up with Dr. Ninfa Linden as needed     I personally saw and evaluated the patient, and participated in the management and treatment plan.  Marnee Spring, PA-C Orthopedics  This document was dictated using Systems analyst. A reasonable attempt at proof reading has been made to minimize errors.

## 2022-09-29 ENCOUNTER — Emergency Department (HOSPITAL_COMMUNITY): Payer: BC Managed Care – PPO

## 2022-09-29 ENCOUNTER — Observation Stay (HOSPITAL_COMMUNITY)
Admission: EM | Admit: 2022-09-29 | Discharge: 2022-10-01 | Disposition: A | Discharge status: Home / Self Care | Payer: BC Managed Care – PPO | Attending: Internal Medicine | Admitting: Internal Medicine

## 2022-09-29 ENCOUNTER — Encounter (HOSPITAL_COMMUNITY): Payer: Self-pay

## 2022-09-29 ENCOUNTER — Other Ambulatory Visit: Payer: Self-pay

## 2022-09-29 DIAGNOSIS — I10 Essential (primary) hypertension: Secondary | ICD-10-CM | POA: Diagnosis present

## 2022-09-29 DIAGNOSIS — R7303 Prediabetes: Secondary | ICD-10-CM | POA: Diagnosis present

## 2022-09-29 DIAGNOSIS — R7989 Other specified abnormal findings of blood chemistry: Secondary | ICD-10-CM

## 2022-09-29 DIAGNOSIS — Z96652 Presence of left artificial knee joint: Secondary | ICD-10-CM | POA: Diagnosis not present

## 2022-09-29 DIAGNOSIS — J90 Pleural effusion, not elsewhere classified: Secondary | ICD-10-CM | POA: Diagnosis not present

## 2022-09-29 DIAGNOSIS — M0579 Rheumatoid arthritis with rheumatoid factor of multiple sites without organ or systems involvement: Secondary | ICD-10-CM | POA: Diagnosis present

## 2022-09-29 DIAGNOSIS — R079 Chest pain, unspecified: Secondary | ICD-10-CM | POA: Diagnosis not present

## 2022-09-29 DIAGNOSIS — R0789 Other chest pain: Secondary | ICD-10-CM | POA: Diagnosis not present

## 2022-09-29 DIAGNOSIS — Z79899 Other long term (current) drug therapy: Secondary | ICD-10-CM | POA: Diagnosis not present

## 2022-09-29 DIAGNOSIS — R778 Other specified abnormalities of plasma proteins: Secondary | ICD-10-CM | POA: Diagnosis not present

## 2022-09-29 DIAGNOSIS — E669 Obesity, unspecified: Secondary | ICD-10-CM | POA: Diagnosis present

## 2022-09-29 DIAGNOSIS — E66812 Obesity, class 2: Secondary | ICD-10-CM | POA: Diagnosis present

## 2022-09-29 DIAGNOSIS — N400 Enlarged prostate without lower urinary tract symptoms: Secondary | ICD-10-CM

## 2022-09-29 LAB — HEPATIC FUNCTION PANEL
ALT: 53 U/L — ABNORMAL HIGH (ref 0–44)
AST: 81 U/L — ABNORMAL HIGH (ref 15–41)
Albumin: 3.1 g/dL — ABNORMAL LOW (ref 3.5–5.0)
Alkaline Phosphatase: 65 U/L (ref 38–126)
Bilirubin, Direct: 0.4 mg/dL — ABNORMAL HIGH (ref 0.0–0.2)
Indirect Bilirubin: 0.7 mg/dL (ref 0.3–0.9)
Total Bilirubin: 1.1 mg/dL (ref 0.3–1.2)
Total Protein: 7.7 g/dL (ref 6.5–8.1)

## 2022-09-29 LAB — CBC
HCT: 43 % (ref 39.0–52.0)
Hemoglobin: 13.4 g/dL (ref 13.0–17.0)
MCH: 25.1 pg — ABNORMAL LOW (ref 26.0–34.0)
MCHC: 31.2 g/dL (ref 30.0–36.0)
MCV: 80.5 fL (ref 80.0–100.0)
Platelets: 289 10*3/uL (ref 150–400)
RBC: 5.34 MIL/uL (ref 4.22–5.81)
RDW: 15.7 % — ABNORMAL HIGH (ref 11.5–15.5)
WBC: 6.5 10*3/uL (ref 4.0–10.5)
nRBC: 0 % (ref 0.0–0.2)

## 2022-09-29 LAB — MAGNESIUM: Magnesium: 2 mg/dL (ref 1.7–2.4)

## 2022-09-29 LAB — TROPONIN I (HIGH SENSITIVITY)
Troponin I (High Sensitivity): 44 ng/L — ABNORMAL HIGH (ref <18)
Troponin I (High Sensitivity): 45 ng/L — ABNORMAL HIGH (ref <18)
Troponin I (High Sensitivity): 42 ng/L — ABNORMAL HIGH (ref <18)

## 2022-09-29 LAB — CBC WITH DIFFERENTIAL/PLATELET
Abs Immature Granulocytes: 0.02 10*3/uL (ref 0.00–0.07)
Basophils Absolute: 0 10*3/uL (ref 0.0–0.1)
Basophils Relative: 0 %
Eosinophils Absolute: 0.1 10*3/uL (ref 0.0–0.5)
Eosinophils Relative: 1 %
HCT: 43.9 % (ref 39.0–52.0)
Hemoglobin: 13.8 g/dL (ref 13.0–17.0)
Immature Granulocytes: 0 %
Lymphocytes Relative: 21 %
Lymphs Abs: 1.5 10*3/uL (ref 0.7–4.0)
MCH: 25.6 pg — ABNORMAL LOW (ref 26.0–34.0)
MCHC: 31.4 g/dL (ref 30.0–36.0)
MCV: 81.3 fL (ref 80.0–100.0)
Monocytes Absolute: 0.5 10*3/uL (ref 0.1–1.0)
Monocytes Relative: 6 %
Neutro Abs: 5.1 10*3/uL (ref 1.7–7.7)
Neutrophils Relative %: 72 %
Platelets: 278 10*3/uL (ref 150–400)
RBC: 5.4 MIL/uL (ref 4.22–5.81)
RDW: 15.7 % — ABNORMAL HIGH (ref 11.5–15.5)
WBC: 7.1 10*3/uL (ref 4.0–10.5)
nRBC: 0 % (ref 0.0–0.2)

## 2022-09-29 LAB — CREATININE, SERUM
Creatinine, Ser: 0.59 mg/dL — ABNORMAL LOW (ref 0.61–1.24)
GFR, Estimated: >60 mL/min (ref >60)

## 2022-09-29 LAB — BASIC METABOLIC PANEL
Anion gap: 9 (ref 5–15)
BUN: 12 mg/dL (ref 6–20)
CO2: 24 mmol/L (ref 22–32)
Calcium: 8.8 mg/dL — ABNORMAL LOW (ref 8.9–10.3)
Chloride: 101 mmol/L (ref 98–111)
Creatinine, Ser: 0.59 mg/dL — ABNORMAL LOW (ref 0.61–1.24)
GFR, Estimated: >60 mL/min (ref >60)
Glucose, Bld: 95 mg/dL (ref 70–99)
Potassium: 4.1 mmol/L (ref 3.5–5.1)
Sodium: 134 mmol/L — ABNORMAL LOW (ref 135–145)

## 2022-09-29 LAB — LIPID PANEL
Cholesterol: 238 mg/dL — ABNORMAL HIGH (ref 0–200)
HDL: 76 mg/dL (ref >40)
LDL Cholesterol: 146 mg/dL — ABNORMAL HIGH (ref 0–99)
Total CHOL/HDL Ratio: 3.1 RATIO
Triglycerides: 81 mg/dL (ref <150)
VLDL: 16 mg/dL (ref 0–40)

## 2022-09-29 LAB — PHOSPHORUS: Phosphorus: 3.6 mg/dL (ref 2.5–4.6)

## 2022-09-29 LAB — LIPASE, BLOOD: Lipase: 29 U/L (ref 11–51)

## 2022-09-29 MED ORDER — IRBESARTAN 300 MG PO TABS
300.0000 mg | ORAL_TABLET | Freq: Every day | ORAL | Status: DC
  Administered 2022-09-29 – 2022-10-01 (×3): 300 mg via ORAL
  Filled 2022-09-29 (×3): qty 1

## 2022-09-29 MED ORDER — AMLODIPINE BESYLATE 10 MG PO TABS
10.0000 mg | ORAL_TABLET | Freq: Every day | ORAL | Status: DC
  Administered 2022-09-29 – 2022-10-01 (×3): 10 mg via ORAL
  Filled 2022-09-29: qty 1
  Filled 2022-09-29 (×2): qty 2

## 2022-09-29 MED ORDER — LEFLUNOMIDE 10 MG PO TABS
20.0000 mg | ORAL_TABLET | Freq: Every day | ORAL | Status: DC
  Administered 2022-09-30 – 2022-10-01 (×2): 20 mg via ORAL
  Filled 2022-09-29 (×2): qty 2

## 2022-09-29 MED ORDER — CELECOXIB 200 MG PO CAPS
200.0000 mg | ORAL_CAPSULE | Freq: Two times a day (BID) | ORAL | Status: DC
  Administered 2022-09-29 – 2022-10-01 (×4): 200 mg via ORAL
  Filled 2022-09-29 (×4): qty 1

## 2022-09-29 MED ORDER — ONDANSETRON HCL 4 MG/2ML IJ SOLN: 4.0000 mg | Freq: Four times a day (QID) | INTRAMUSCULAR | Status: DC | PRN

## 2022-09-29 MED ORDER — TIZANIDINE HCL 4 MG PO TABS: 2.0000 mg | ORAL_TABLET | Freq: Three times a day (TID) | ORAL | Status: DC | PRN

## 2022-09-29 MED ORDER — MORPHINE SULFATE (PF) 2 MG/ML IV SOLN: 2.0000 mg | INTRAVENOUS | Status: DC | PRN

## 2022-09-29 MED ORDER — PANTOPRAZOLE SODIUM 40 MG PO TBEC
40.0000 mg | DELAYED_RELEASE_TABLET | Freq: Two times a day (BID) | ORAL | Status: DC
  Administered 2022-09-29 – 2022-10-01 (×4): 40 mg via ORAL
  Filled 2022-09-29 (×4): qty 1

## 2022-09-29 MED ORDER — ALUM & MAG HYDROXIDE-SIMETH 200-200-20 MG/5ML PO SUSP
30.0000 mL | Freq: Once | ORAL | Status: AC
  Administered 2022-09-29: 30 mL via ORAL
  Filled 2022-09-29: qty 30

## 2022-09-29 MED ORDER — IOHEXOL 350 MG/ML SOLN
100.0000 mL | Freq: Once | INTRAVENOUS | Status: AC | PRN
  Administered 2022-09-29: 100 mL via INTRAVENOUS

## 2022-09-29 MED ORDER — SODIUM CHLORIDE 0.9 % IV SOLN: INTRAVENOUS | Status: AC

## 2022-09-29 MED ORDER — ONDANSETRON HCL 4 MG PO TABS: 4.0000 mg | ORAL_TABLET | Freq: Four times a day (QID) | ORAL | Status: DC | PRN

## 2022-09-29 MED ORDER — OLMESARTAN MEDOXOMIL-HCTZ 20-12.5 MG PO TABS: 1.0000 | ORAL_TABLET | Freq: Every day | ORAL | Status: DC

## 2022-09-29 MED ORDER — ENOXAPARIN SODIUM 40 MG/0.4ML IJ SOSY
40.0000 mg | PREFILLED_SYRINGE | INTRAMUSCULAR | Status: DC
  Administered 2022-09-29: 40 mg via SUBCUTANEOUS
  Filled 2022-09-29: qty 0.4

## 2022-09-29 MED ORDER — LIDOCAINE 5 % EX PTCH
1.0000 | MEDICATED_PATCH | CUTANEOUS | Status: DC
  Administered 2022-09-29: 1 via TRANSDERMAL
  Filled 2022-09-29 (×3): qty 1

## 2022-09-29 MED ORDER — DEXTROMETHORPHAN POLISTIREX ER 30 MG/5ML PO SUER: 30.0000 mg | Freq: Two times a day (BID) | ORAL | Status: DC | PRN

## 2022-09-29 MED ORDER — FOLIC ACID 1 MG PO TABS
1.0000 mg | ORAL_TABLET | Freq: Every day | ORAL | Status: DC
  Administered 2022-09-29 – 2022-10-01 (×3): 1 mg via ORAL
  Filled 2022-09-29 (×3): qty 1

## 2022-09-29 MED ORDER — CYCLOSPORINE 0.05 % OP EMUL
1.0000 [drp] | Freq: Two times a day (BID) | OPHTHALMIC | Status: DC
  Administered 2022-09-29 – 2022-10-01 (×4): 1 [drp] via OPHTHALMIC
  Filled 2022-09-29 (×4): qty 30

## 2022-09-29 MED ORDER — FAMOTIDINE IN NACL 20-0.9 MG/50ML-% IV SOLN
20.0000 mg | Freq: Once | INTRAVENOUS | Status: AC
  Administered 2022-09-29: 20 mg via INTRAVENOUS
  Filled 2022-09-29: qty 50

## 2022-09-29 MED ORDER — HYDROCHLOROTHIAZIDE 12.5 MG PO TABS
12.5000 mg | ORAL_TABLET | Freq: Once | ORAL | Status: AC
  Administered 2022-09-29: 12.5 mg via ORAL
  Filled 2022-09-29: qty 1

## 2022-09-29 MED ORDER — KETOROLAC TROMETHAMINE 15 MG/ML IJ SOLN
15.0000 mg | Freq: Once | INTRAMUSCULAR | Status: AC
  Administered 2022-09-29: 15 mg via INTRAVENOUS
  Filled 2022-09-29: qty 1

## 2022-09-29 MED ORDER — AMLODIPINE BESYLATE 5 MG PO TABS
10.0000 mg | ORAL_TABLET | Freq: Once | ORAL | Status: AC
  Administered 2022-09-29: 10 mg via ORAL
  Filled 2022-09-29: qty 2

## 2022-09-29 MED ORDER — NITROGLYCERIN 0.4 MG SL SUBL
0.4000 mg | SUBLINGUAL_TABLET | SUBLINGUAL | Status: DC | PRN
  Administered 2022-09-29 (×2): 0.4 mg via SUBLINGUAL
  Filled 2022-09-29: qty 1

## 2022-09-29 MED ORDER — CLONIDINE HCL 0.1 MG PO TABS
0.2000 mg | ORAL_TABLET | Freq: Once | ORAL | Status: AC
  Administered 2022-09-29: 0.2 mg via ORAL
  Filled 2022-09-29: qty 2

## 2022-09-29 MED ORDER — ACETAMINOPHEN 650 MG RE SUPP: 650.0000 mg | Freq: Four times a day (QID) | RECTAL | Status: DC | PRN

## 2022-09-29 MED ORDER — HYDROCHLOROTHIAZIDE 12.5 MG PO TABS
12.5000 mg | ORAL_TABLET | Freq: Every day | ORAL | Status: DC
  Administered 2022-09-29 – 2022-09-30 (×2): 12.5 mg via ORAL
  Filled 2022-09-29 (×2): qty 1

## 2022-09-29 MED ORDER — ACETAMINOPHEN 325 MG PO TABS: 650.0000 mg | ORAL_TABLET | Freq: Four times a day (QID) | ORAL | Status: DC | PRN

## 2022-09-29 MED ORDER — SODIUM CHLORIDE (PF) 0.9 % IJ SOLN
INTRAMUSCULAR | Status: AC
  Filled 2022-09-29: qty 50

## 2022-09-29 NOTE — H&P (Signed)
History and Physical    Patient: Anthony Boyd ZOX:[id number] DOB: [birth year] DOA: 09/29/2022 DOS: the patient was seen and examined on 09/29/2022 PCP: Georgianne Fick, MD   Patient coming from: Home  Chief Complaint:  Chief Complaint  Patient presents with   Chest Pain   HPI: Anthony Boyd is a 57 y.o. male with medical history significant of class II obesity, hypertension, rheumatoid arthritis, prediabetes and GERD; who presented to the hospital secondary to chest pain.  Patient expressed chest discomfort started around midday on Saturday affecting left side of his chest and radiated into midsection of his left chest, patient reports that any movement or coughing spells will increase discomfort; there was no association with eating and denies diaphoresis, nausea, vomiting and palpitations.  Patient reported that the pain is off throughout Saturday and Sunday but worsened again Monday morning with associated dyspnea on exertion at that time.  Patient contacted his primary care doctor who recommended coming to the hospital for further evaluation and management.  While in the ED EKG failed to demonstrate any acute ischemic changes and his cardiac troponin had flat elevation of 44-45; patient reported very little relief after receiving nitroglycerin.   Given his risk factors Case was discussed with cardiology service who recommended transfer to [facility] for a stress test and further evaluation/management.  [facility] contacted to place patient in the hospital and facilitate transfer and workup.  Review of Systems: As mentioned in the history of present illness. All other systems reviewed and are negative. Past Medical History:  Diagnosis Date   Allergy    Arthritis    Knees, 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   Hypertension    Osteoporosis    Rheumatoid arthritis(714.0)    Past Surgical History:  Procedure Laterality Date   FOOT SURGERY  1998, 2001   INSERTION OF MESH N/A 04/29/2022   Procedure:  INSERTION OF MESH;  Surgeon: Manus Rudd, MD;  Location: [facility];  Service: General;  Laterality: N/A;   KNEE SURGERY  2003   left   TOTAL KNEE ARTHROPLASTY Left 03/04/2019   Procedure: LEFT TOTAL KNEE ARTHROPLASTY;  Surgeon: Kathryne Hitch, MD;  Location: [facility];  Service: Orthopedics;  Laterality: Left;   UMBILICAL HERNIA REPAIR N/A 04/29/2022   Procedure: OPEN UMBILICAL HERNIA REPAIR WITH MESH;  Surgeon: Manus Rudd, MD;  Location: [facility];  Service: General;  Laterality: N/A;   Social History:  reports that he has never smoked. He has never used smokeless tobacco. He reports that he does not drink alcohol and does not use drugs.  No Known Allergies  Family History  Problem Relation Age of Onset   Hypertension Mother    Hypertension Father     Prior to Admission medications   Medication Sig Start Date End Date Taking? Authorizing Provider  amLODipine (NORVASC) 10 MG tablet Take 10 mg by mouth Boyd.    [provider]  celecoxib (CELEBREX) 200 MG capsule Take 200 mg by mouth 2 (two) times Boyd. 05/29/17   [provider]  folic acid (FOLVITE) 1 MG tablet Take 1 mg by mouth Boyd. 04/02/17   [provider]  golimumab (SIMPONI ARIA) 50 MG/4ML SOLN injection Inject 50 mg into the vein every 8 (eight) weeks.    [provider]  leflunomide (ARAVA) 20 MG tablet Take 20 mg by mouth Boyd.    [provider]  olmesartan-hydrochlorothiazide (BENICAR HCT) 20-12.5 MG tablet Take 1 tablet by mouth Boyd. 02/21/22  [provider]  RESTASIS 0.05 % ophthalmic emulsion  12/05/21   [provider]  tiZANidine (ZANAFLEX) 2 MG tablet Take 2 mg by mouth 3 (three) times Boyd as needed. 11/27/21   [provider]  triamcinolone cream (KENALOG) 0.1 % Apply topically Boyd. 11/27/21   [provider]    Physical Exam: Vitals:   09/29/22 1711 09/29/22 1720 09/29/22 1730  09/29/22 1800  BP: (!) 161/107  (!) 162/109 (!) 155/98  Pulse:  88 87 87  Resp:  (!) 22 19 (!) 25  Temp:    98.4 F (36.9 C)  TempSrc:    Oral  SpO2:  92% 97% 96%  Weight:      Height:       General exam: Alert, awake, oriented x 3; no nausea, no vomiting, no shortness of breath. Respiratory system: Good air movement bilaterally; no wheezing or crackles appreciated.  Good saturation on room air. Cardiovascular system:RRR. No rubs or gallops, no JVD. Gastrointestinal system: Abdomen is obese, nondistended, soft and nontender. No organomegaly or masses felt. Normal bowel sounds heard. Central nervous system: Alert and oriented. No focal neurological deficits. Extremities: No cyanosis or clubbing, no edema. Skin: No rashes, no petechiae. Psychiatry: Judgement and insight appear normal. Mood & affect appropriate.   Data Reviewed: High sensitive troponin: 44>>> 45 Basic metabolic panel: Sodium 134, potassium 4.1, chloride 101, bicarb 24, BUN 12, creatinine 0.59 and GFR more than 60 LFTs: Total protein 7.7, AST 81, ALT 53 and total bilirubin 1.1; alkaline phosphatase 65. Lipase: 29 CBC: WBC 7.1, hemoglobin 13.8 and platelet count 278K Chest x-ray: No acute cardiopulmonary process appreciated; atelectasis bilaterally seen. CT angiogram: Negative for pulmonary embolism; no acute dissection.   Assessment and Plan: * Chest pain -Heart score 4-5 -Atypical presentation with typical symptoms and significant risk factors; has never been evaluated for cardiac problems. -Troponin flat elevation 44-45; will continue to cycle -Telemetry monitoring and 2D echo will be ordered. -Case discussed with cardiology service who has recommended transfer to [facility] for stress test and further evaluation. -Will check TSH, lipid panel and A1c. -Protonix twice a day and as needed analgesics will be provided.  Class 2 obesity -Body mass index is 35.71 kg/m. -Low-calorie diet, portion control  and increase physical activity discussed with patient.  Rheumatoid arthritis of multiple sites without organ or system involvement with positive rheumatoid factor -Continue home analgesic regimen and the use of leflunomide. -Patient will also continue every 3 weeks injections of Golimumab and outpatient follow up with rheumatology service.  Pre-diabetes -Update A1c -Modified carbohydrate diet, weight loss and increase physical activity discussed with patient. -Maintain adequate hydration.  Essential hypertension -Resume home antihypertensive agents -Heart healthy/low-sodium diet discussed with patient. -Follow-up vital signs.  Elevated LFTs -Mild elevation -Patient reports secondary to previous rheumatology treatment (prior use of methotrexate). -Minimize hepatotoxic agents and follow trend. -Patient methotrexate has now been discontinued and a new regimen has been started for his rheumatoid arthritis.  Advance Care Planning:   Code Status: Full Code   Consults: Cardiology service consulted by EDP.  Family Communication: No family at bedside.  Severity of Illness: The appropriate patient status for this patient is OBSERVATION. Observation status is judged to be reasonable and necessary in order to provide the required intensity of service to ensure the patient's safety. The patient's presenting symptoms, physical exam findings, and initial radiographic and laboratory data in the context of their medical condition is felt to place them at decreased risk for  further clinical deterioration. Furthermore, it is anticipated that the patient will be medically stable for discharge from the hospital within 2 midnights of admission.   Author: Vassie Loll, MD 09/29/2022 7:33 PM  For on call review [email or web address].

## 2022-09-29 NOTE — Discharge Instructions (Signed)
Your workup in the ER today showed signs of potential stress on the heart with abnormal troponin levels, a blood test for heart enzymes.  We discussed the options of staying in the hospital for heart testing versus a referral to see a cardiologist as an outpatient.  It is very important that you have a workup for your heart's health.  You have risk factors for heart disease including high blood pressure.  If you choose to leave home from the emergency department, you understand that there is a risk of undiagnosed heart disease - which can lead to heart attack, heart failure, stroke, or other serious medical conditions.  You understand you need to return to the ER if your symptoms return or worsen.  If your chest pain worsens, or you become lightheaded or dizzy, you need to call 911 and return to the ER.  *  Please note also that your CT scan showed a mildly enlarged prostate and a small amount of fluid at the bottom of your lung.  You can follow up with your primary care provider for these issues.

## 2022-09-29 NOTE — ED Notes (Signed)
Patient transported to CT 

## 2022-09-29 NOTE — Assessment & Plan Note (Signed)
-  Heart score 4-5 -Atypical presentation with typical symptoms and significant risk factors; has never been evaluated for cardiac problems. -Troponin flat elevation 44-45; will continue to cycle -Telemetry monitoring and 2D echo will be ordered. -Case discussed with cardiology service who has recommended transfer to [facility] for stress test and further evaluation. -Will check TSH, lipid panel and A1c. -Protonix twice a day and as needed analgesics will be provided.

## 2022-09-29 NOTE — Assessment & Plan Note (Signed)
-  Resume home antihypertensive agents -Heart healthy/low-sodium diet discussed with patient. -Follow-up vital signs.

## 2022-09-29 NOTE — Assessment & Plan Note (Signed)
-  Continue home analgesic regimen and the use of leflunomide. -Patient will also continue every 3 weeks injections of Golimumab and outpatient follow up with rheumatology service.

## 2022-09-29 NOTE — Assessment & Plan Note (Signed)
-  Body mass index is 35.71 kg/m. -Low-calorie diet, portion control and increase physical activity discussed with patient.

## 2022-09-29 NOTE — ED Triage Notes (Addendum)
Patient has had left sided chest pain since Saturday after eating a salad. He said sometimes lettuce causes his chest pain. No nausea or vomiting or radiation. Has not taken blood pressure medication in 2 days. Took 3 Tums without relief Sunday.

## 2022-09-29 NOTE — ED Provider Notes (Signed)
[place] EMERGENCY DEPARTMENT AT [facility] Provider Note   CSN: [id number] Arrival date & time: 09/29/22  1210     History  Chief Complaint  Patient presents with   Chest Pain    Anthony Boyd is a 57 y.o. male with a history of hypertension presented to ED complaining of epigastric and substernal chest pain.  Patient reports his symptoms began approximately 2 days ago, after he ate a salad dressing, and he says "in the past lettuce and solid have really upset my stomach".  He did try taking Tums and Rolaids but had no relief of his chest discomfort.  Symptoms have been persistent for 2 days.  He describes a sharp discomfort that was originally directly substernal but has migrated several inches lower now into the epigastrium.  It is present all day.  Not necessarily worse with eating.  He denies nausea, vomiting, constipation or diarrhea.  He denies history of MI, smoking, coronary disease.  He does endorse general fatigue for 2 days.  He denies any known personal or family history of aneurysm.  HPI     Home Medications Prior to Admission medications   Medication Sig Start Date End Date Taking? Authorizing Provider  amLODipine (NORVASC) 10 MG tablet Take 10 mg by mouth daily.    [provider]  celecoxib (CELEBREX) 200 MG capsule Take 200 mg by mouth 2 (two) times daily. 05/29/17   [provider]  folic acid (FOLVITE) 1 MG tablet Take 1 mg by mouth daily. 04/02/17   [provider]  golimumab (SIMPONI ARIA) 50 MG/4ML SOLN injection Inject 50 mg into the vein every 8 (eight) weeks.    [provider]  leflunomide (ARAVA) 20 MG tablet Take 20 mg by mouth daily.    [provider]  olmesartan-hydrochlorothiazide (BENICAR HCT) 20-12.5 MG tablet Take 1 tablet by mouth daily. 02/21/22   [provider]  RESTASIS 0.05 % ophthalmic emulsion  12/05/21   [provider]  tiZANidine (ZANAFLEX) 2 MG tablet Take 2 mg  by mouth 3 (three) times daily as needed. 11/27/21   [provider]  triamcinolone cream (KENALOG) 0.1 % Apply topically daily. 11/27/21   [provider]      Allergies    Patient has no known allergies.    Review of Systems   Review of Systems  Physical Exam Updated Vital Signs BP (!) 155/98   Pulse 87   Temp 98.6 F (37 C) (Oral)   Resp (!) 25   Ht  (1.702 m)   Wt 103.4 kg   SpO2 96%   BMI 35.71 kg/m  Physical Exam Constitutional:      General: He is not in acute distress. HENT:     Head: Normocephalic and atraumatic.  Eyes:     Conjunctiva/sclera: Conjunctivae normal.     Pupils: Pupils are equal, round, and reactive to light.  Cardiovascular:     Rate and Rhythm: Normal rate and regular rhythm.  Pulmonary:     Effort: Pulmonary effort is normal. No respiratory distress.  Abdominal:     General: There is no distension.     Tenderness: There is no abdominal tenderness.  Skin:    General: Skin is warm and dry.  Neurological:     General: No focal deficit present.     Mental Status: He is alert. Mental status is at baseline.  Psychiatric:        Mood and Affect: Mood normal.  Behavior: Behavior normal.     ED Results / Procedures / Treatments   Labs (all labs ordered are listed, but only abnormal results are displayed) Labs Reviewed  CBC WITH DIFFERENTIAL/PLATELET - Abnormal; Notable for the following components:      Result Value   MCH 25.6 (*)    RDW 15.7 (*)    All other components within normal limits  BASIC METABOLIC PANEL - Abnormal; Notable for the following components:   Sodium 134 (*)    Creatinine, Ser 0.59 (*)    Calcium 8.8 (*)    All other components within normal limits  HEPATIC FUNCTION PANEL - Abnormal; Notable for the following components:   Albumin 3.1 (*)    AST 81 (*)    ALT 53 (*)    Bilirubin, Direct 0.4 (*)    All other components within normal limits  TROPONIN I (HIGH SENSITIVITY) - Abnormal;  Notable for the following components:   Troponin I (High Sensitivity) 44 (*)    All other components within normal limits  TROPONIN I (HIGH SENSITIVITY) - Abnormal; Notable for the following components:   Troponin I (High Sensitivity) 45 (*)    All other components within normal limits  LIPASE, BLOOD    EKG EKG Interpretation  Date/Time:  Monday September 29 2022 12:17:20 EDT Ventricular Rate:  88 PR Interval:  138 QRS Duration: 93 QT Interval:  348 QTC Calculation: 421 R Axis:   82 Text Interpretation: Sinus rhythm Ventricular premature complex Confirmed by Alvester Chou [phone]) on 09/29/2022 12:35:41 PM  Radiology CT Angio Chest/Abd/Pel for Dissection W and/or Wo Contrast  Result Date: 09/29/2022 CLINICAL DATA:  Chest pain. EXAM: CT ANGIOGRAPHY CHEST, ABDOMEN AND PELVIS TECHNIQUE: Non-contrast CT of the chest was initially obtained. Multidetector CT imaging through the chest, abdomen and pelvis was performed using the standard protocol during bolus administration of intravenous contrast. Multiplanar reconstructed images and MIPs were obtained and reviewed to evaluate the vascular anatomy. RADIATION DOSE REDUCTION: This exam was performed according to the departmental dose-optimization program which includes automated exposure control, adjustment of the mA and/or kV according to patient size and/or use of iterative reconstruction technique. CONTRAST:  OMNIPAQUE IOHEXOL 350 MG/ML SOLN COMPARISON:  May 17, 2009. FINDINGS: CTA CHEST FINDINGS Cardiovascular: Preferential opacification of the thoracic aorta. No evidence of thoracic aortic aneurysm or dissection. Normal heart size. No pericardial effusion. Mediastinum/Nodes: No enlarged mediastinal, hilar, or axillary lymph nodes. Thyroid gland, trachea, and esophagus demonstrate no significant findings. Lungs/Pleura: No pneumothorax is noted. Minimal left pleural effusion is noted. Mild bibasilar subsegmental atelectasis is noted.  Musculoskeletal: No chest wall abnormality. No acute or significant osseous findings. Review of the MIP images confirms the above findings. CTA ABDOMEN AND PELVIS FINDINGS VASCULAR Aorta: Normal caliber aorta without aneurysm, dissection, vasculitis or significant stenosis. Celiac: Patent without evidence of aneurysm, dissection, vasculitis or significant stenosis. SMA: Patent without evidence of aneurysm, dissection, vasculitis or significant stenosis. Renals: Bilateral renal arteries are patent without evidence of aneurysm, dissection, vasculitis, fibromuscular dysplasia or significant stenosis. IMA: Patent without evidence of aneurysm, dissection, vasculitis or significant stenosis. Inflow: Patent without evidence of aneurysm, dissection, vasculitis or significant stenosis. Veins: No obvious venous abnormality within the limitations of this arterial phase study. Review of the MIP images confirms the above findings. NON-VASCULAR Hepatobiliary: No focal liver abnormality is seen. No gallstones, gallbladder wall thickening, or biliary dilatation. Pancreas: Unremarkable. No pancreatic ductal dilatation or surrounding inflammatory changes. Spleen: Normal in size without focal abnormality. Adrenals/Urinary Tract: Adrenal  glands are unremarkable. Kidneys are normal, without renal calculi, focal lesion, or hydronephrosis. Bladder is unremarkable. Stomach/Bowel: Stomach is within normal limits. Appendix appears normal. No evidence of bowel wall thickening, distention, or inflammatory changes. Lymphatic: No significant adenopathy is noted. Reproductive: Mild prostatic enlargement. Other: Small fat containing right inguinal hernia. No ascites is noted. Musculoskeletal: No acute or significant osseous findings. Review of the MIP images confirms the above findings. IMPRESSION: No evidence of thoracic or abdominal aortic dissection or aneurysm. Minimal left pleural effusion is noted. Mild bibasilar subsegmental atelectasis is  noted. Mild prostatic enlargement. Small fat containing right inguinal hernia. Electronically Signed   By: Lupita Raider M.D.   On: 09/29/2022 16:39   DG Chest Portable 1 View  Result Date: 09/29/2022 CLINICAL DATA:  Left-sided substernal chest pain EXAM: PORTABLE CHEST 1 VIEW COMPARISON:  05/08/2009 FINDINGS: Heart size upper limits of normal. Mild tortuous aorta. Increased markings at both lung bases that could be atelectasis or mild bibasilar pneumonia. Upper lungs are clear. No visible effusion. IMPRESSION: Increased markings at both lung bases that could be atelectasis or mild bibasilar pneumonia. Electronically Signed   By: Paulina Fusi M.D.   On: 09/29/2022 13:23    Procedures Procedures    Medications Ordered in ED Medications  nitroGLYCERIN (NITROSTAT) SL tablet 0.4 mg (0.4 mg Sublingual Given 09/29/22 1710)  cloNIDine (CATAPRES) tablet 0.2 mg (0.2 mg Oral Given 09/29/22 1253)  famotidine (PEPCID) IVPB 20 mg premix (0 mg Intravenous Stopped 09/29/22 1342)  alum & mag hydroxide-simeth (MAALOX/MYLANTA) 200-200-20 MG/5ML suspension 30 mL (30 mLs Oral Given 09/29/22 1251)  amLODipine (NORVASC) tablet 10 mg (10 mg Oral Given 09/29/22 1255)  hydrochlorothiazide (HYDRODIURIL) tablet 12.5 mg (12.5 mg Oral Given 09/29/22 1255)  iohexol (OMNIPAQUE) 350 MG/ML injection 100 mL (100 mLs Intravenous Contrast Given 09/29/22 1547)    ED Course/ Medical Decision Making/ A&P Clinical Course as of 09/29/22 1826  Mon Sep 29, 2022  1247 Patient reported he did not take his blood pressure medications today or yesterday. [MT]  1657 There are no emergent findings on the patient's CT dissection study.  I reassessed the patient reports overall some improvement of his midthoracic chest pain, but it is still present.  We did discuss his elevated troponin levels, unfortunately would not have any prior level to see if he is chronically elevated, but they are slightly abnormal.  Overall it is reassuring that the  troponins are flat and that his EKG does not show acute ischemic findings. [MT]  1658 We discussed the option of hospitalization for further cardiac workup versus referral and close outpatient follow-up with a cardiologist, with return precautions if he were to go home.  He has not yet made up his mind which type of workup he would prefer.  Alternatively I did discuss a trial of nitroglycerin here to see if this relieves his chest discomfort, and he is wanting to do so. [MT]    Clinical Course User Index [MT] Mariaha Ellington, Kermit Balo, MD                             Medical Decision Making Amount and/or Complexity of Data Reviewed Labs: ordered. Radiology: ordered.  Risk OTC drugs. Prescription drug management.   This patient presents to the Emergency Department with complaint of chest pain. This involves an extensive number of treatment options, and is a complaint that carries with it a high risk of complications and morbidity, given  the patient's comorbidity, including hypertension.The differential diagnosis includes ACS vs Pneumothorax vs Reflux/Gastritis vs MSK pain vs Pneumonia vs other.  I felt PE was less likely given that no acute risk factors for PE  I ordered, reviewed, and interpreted labs.  Pertinent results include wbc wnl; hgb 13.8; troponins mildly elevated 44 -> 45 I ordered medication GI cocktail for possible gastritis; SL nitro for chest pain I ordered imaging studies which included dg chest, CTA dissection study  I independently visualized and interpreted imaging which showed no emergent findings, and the monitor tracing which showed NSR. I agree with the radiologist interpretation  CT imaging w/ incidental small fat containing hernia, enlarged prostate, small pleural effusion - unlikely to be contributing to active symptoms  I personally reviewed the patients ECG which showed sinus rhythm with no acute ischemic findings  After the interventions stated above, I reevaluated  the patient and found that they were improved.  The patient was signed out to Dr Wyn Forster Kommor EDP at 5 pm pending reassessment of symptoms after SL nitro.  I've discussed with the patient the option of observation stay in the hospital for cardiac testing, versus referral for outpatient cardiology evaluation. The patient understands that a single workup in the ED does not exclude serious heart and health conditions.          Final Clinical Impression(s) / ED Diagnoses Final diagnoses:  None    Rx / DC Orders ED Discharge Orders     None         Terald Sleeper, MD 09/29/22 [phone]

## 2022-09-29 NOTE — Assessment & Plan Note (Signed)
-  Update A1c -Modified carbohydrate diet, weight loss and increase physical activity discussed with patient. -Maintain adequate hydration.

## 2022-09-29 NOTE — ED Provider Notes (Signed)
  Physical Exam  BP (!) 155/98   Pulse 87   Temp 98.6 F (37 C) (Oral)   Resp (!) 25   Ht  (1.702 m)   Wt 103.4 kg   SpO2 96%   BMI 35.71 kg/m   Physical Exam Constitutional:      General: He is not in acute distress.    Appearance: Normal appearance.  HENT:     Head: Normocephalic and atraumatic.     Nose: No congestion or rhinorrhea.  Eyes:     General:        Right eye: No discharge.        Left eye: No discharge.     Extraocular Movements: Extraocular movements intact.     Pupils: Pupils are equal, round, and reactive to light.  Cardiovascular:     Rate and Rhythm: Normal rate and regular rhythm.     Heart sounds: No murmur heard. Pulmonary:     Effort: No respiratory distress.     Breath sounds: No wheezing or rales.  Abdominal:     General: There is no distension.     Tenderness: There is no abdominal tenderness.  Musculoskeletal:        General: Normal range of motion.     Cervical back: Normal range of motion.  Skin:    General: Skin is warm and dry.  Neurological:     General: No focal deficit present.     Mental Status: He is alert.     Procedures  Procedures  ED Course / MDM   Clinical Course as of 09/29/22 1833  Mon Sep 29, 2022  1247 Patient reported he did not take his blood pressure medications today or yesterday. [MT]  1657 There are no emergent findings on the patient's CT dissection study.  I reassessed the patient reports overall some improvement of his midthoracic chest pain, but it is still present.  We did discuss his elevated troponin levels, unfortunately would not have any prior level to see if he is chronically elevated, but they are slightly abnormal.  Overall it is reassuring that the troponins are flat and that his EKG does not show acute ischemic findings. [MT]  1658 We discussed the option of hospitalization for further cardiac workup versus referral and close outpatient follow-up with a cardiologist, with return precautions if  he were to go home.  He has not yet made up his mind which type of workup he would prefer.  Alternatively I did discuss a trial of nitroglycerin here to see if this relieves his chest discomfort, and he is wanting to do so. [MT]    Clinical Course User Index [MT] Trifan, Kermit Balo, MD   Medical Decision Making Amount and/or Complexity of Data Reviewed Labs: ordered. Radiology: ordered.  Risk OTC drugs. Prescription drug management.   Patient received in handoff.  Moderate risk chest pain pending reevaluation after nitroglycerin.  Plan for likely admission for stress testing.  On reevaluation, patient with persistent chest pain and requesting hospital admission for stress testing tomorrow.  I spoke with Dr. Admission who will speak with his APP's to help arrange this tomorrow morning.  Patient then admitted to medicine for moderate risk chest pain.       Glendora Score, MD 09/29/22 [phone]

## 2022-09-30 ENCOUNTER — Observation Stay (HOSPITAL_BASED_OUTPATIENT_CLINIC_OR_DEPARTMENT_OTHER): Payer: BC Managed Care – PPO

## 2022-09-30 DIAGNOSIS — N400 Enlarged prostate without lower urinary tract symptoms: Secondary | ICD-10-CM | POA: Diagnosis not present

## 2022-09-30 DIAGNOSIS — R7989 Other specified abnormal findings of blood chemistry: Secondary | ICD-10-CM | POA: Diagnosis not present

## 2022-09-30 DIAGNOSIS — R072 Precordial pain: Secondary | ICD-10-CM | POA: Diagnosis not present

## 2022-09-30 DIAGNOSIS — I10 Essential (primary) hypertension: Secondary | ICD-10-CM | POA: Diagnosis not present

## 2022-09-30 DIAGNOSIS — I160 Hypertensive urgency: Secondary | ICD-10-CM

## 2022-09-30 DIAGNOSIS — R079 Chest pain, unspecified: Secondary | ICD-10-CM

## 2022-09-30 DIAGNOSIS — R0609 Other forms of dyspnea: Secondary | ICD-10-CM | POA: Diagnosis not present

## 2022-09-30 DIAGNOSIS — R0789 Other chest pain: Secondary | ICD-10-CM | POA: Diagnosis not present

## 2022-09-30 LAB — CBC
HCT: 41.1 % (ref 39.0–52.0)
Hemoglobin: 13 g/dL (ref 13.0–17.0)
MCH: 25.5 pg — ABNORMAL LOW (ref 26.0–34.0)
MCHC: 31.6 g/dL (ref 30.0–36.0)
MCV: 80.7 fL (ref 80.0–100.0)
Platelets: 265 10*3/uL (ref 150–400)
RBC: 5.09 MIL/uL (ref 4.22–5.81)
RDW: 15.7 % — ABNORMAL HIGH (ref 11.5–15.5)
WBC: 5.8 10*3/uL (ref 4.0–10.5)
nRBC: 0 % (ref 0.0–0.2)

## 2022-09-30 LAB — BASIC METABOLIC PANEL
Anion gap: 9 (ref 5–15)
BUN: 11 mg/dL (ref 6–20)
CO2: 26 mmol/L (ref 22–32)
Calcium: 8.5 mg/dL — ABNORMAL LOW (ref 8.9–10.3)
Chloride: 100 mmol/L (ref 98–111)
Creatinine, Ser: 0.63 mg/dL (ref 0.61–1.24)
GFR, Estimated: >60 mL/min (ref >60)
Glucose, Bld: 94 mg/dL (ref 70–99)
Potassium: 3.4 mmol/L — ABNORMAL LOW (ref 3.5–5.1)
Sodium: 135 mmol/L (ref 135–145)

## 2022-09-30 LAB — ECHOCARDIOGRAM COMPLETE
Area-P 1/2: 2.62 cm2
Calc EF: 63 %
Height: 67 in
S' Lateral: 2.9 cm
Single Plane A2C EF: 60.8 %
Single Plane A4C EF: 65.1 %
Weight: 3648 oz

## 2022-09-30 LAB — TSH: TSH: 3.586 u[IU]/mL (ref 0.350–4.500)

## 2022-09-30 LAB — HIV ANTIBODY (ROUTINE TESTING W REFLEX): HIV Screen 4th Generation wRfx: NONREACTIVE

## 2022-09-30 LAB — HEMOGLOBIN A1C
Hgb A1c MFr Bld: 5.9 % — ABNORMAL HIGH (ref 4.8–5.6)
Mean Plasma Glucose: 122.63 mg/dL

## 2022-09-30 LAB — TROPONIN I (HIGH SENSITIVITY): Troponin I (High Sensitivity): 38 ng/L — ABNORMAL HIGH (ref <18)

## 2022-09-30 MED ORDER — POTASSIUM CHLORIDE CRYS ER 20 MEQ PO TBCR
60.0000 meq | EXTENDED_RELEASE_TABLET | Freq: Once | ORAL | Status: AC
  Administered 2022-09-30: 60 meq via ORAL
  Filled 2022-09-30: qty 3

## 2022-09-30 MED ORDER — METOPROLOL TARTRATE 25 MG PO TABS
100.0000 mg | ORAL_TABLET | Freq: Once | ORAL | Status: AC
  Administered 2022-09-30: 100 mg via ORAL
  Filled 2022-09-30: qty 4

## 2022-09-30 MED ORDER — IOHEXOL 350 MG/ML SOLN
95.0000 mL | Freq: Once | INTRAVENOUS | Status: AC | PRN
  Administered 2022-09-30: 95 mL via INTRAVENOUS

## 2022-09-30 NOTE — Progress Notes (Signed)
  Transition of Care [facility]) Screening Note   Patient Details  Name: Anthony Boyd Date of Birth: 10/23/1965   Transition of Care [facility]) CM/SW Contact:    Lanier Clam, RN Phone Number: 09/30/2022, 2:48 PM    Transition of Care Department [facility]) has reviewed patient and no TOC needs have been identified at this time. We will continue to monitor patient advancement through interdisciplinary progression rounds. If new patient transition needs arise, please place a TOC consult.

## 2022-09-30 NOTE — Hospital Course (Signed)
57 y.o. male with medical history significant of class II obesity, hypertension, rheumatoid arthritis, prediabetes and GERD; who presented to the hospital secondary to chest pain.   In the ED, initial EKG was noted to be unremarkable with trop noted to be around 44-45, little relief with NTG. Cardiology was consulted who recommended inpt work up for chest pain.

## 2022-09-30 NOTE — Progress Notes (Signed)
  Progress Note   Patient: Anthony Boyd HKV:[id number] DOB: 1965-09-26 DOA: 09/29/2022     0 DOS: the patient was seen and examined on 09/30/2022   Brief hospital course: 57 y.o. male with medical history significant of class II obesity, hypertension, rheumatoid arthritis, prediabetes and GERD; who presented to the hospital secondary to chest pain.   In the ED, initial EKG was noted to be unremarkable with trop noted to be around 44-45, little relief with NTG. Cardiology was consulted who recommended inpt work up for chest pain.  Assessment and Plan: Chest pain -Heart score 4-5 -Atypical presentation with typical symptoms and significant risk factors; has never been evaluated for cardiac problems. -Trop initially 45, trended down to 38 -2d echo reviewed. Normal LVEF without WMA -Appreciate input by Cardiology. Recs for coronary CT which was performed 4/23, awaiting results -TSH 3.586, A1c 5.9, LDL 146   Class 2 obesity -Body mass index is 35.71 kg/m. -recommend diet/lifestyle modification   Rheumatoid arthritis of multiple sites without organ or system involvement with positive rheumatoid factor -Continue home analgesic regimen and the use of leflunomide. -Patient will also continue every 3 weeks injections of Golimumab and outpatient follow up with rheumatology service.   Pre-diabetes -A1c 5.9 -diet/lifestyle modification   Essential hypertension -continue home antihypertensive agents -Heart healthy/low-sodium diet  -Follow-up vital signs.   Elevated LFTs -Mild elevation -Patient reports secondary to previous rheumatology treatment (prior use of methotrexate). -Minimize hepatotoxic agents and follow trend. -Patient methotrexate has now been discontinued and a new regimen has been started for his rheumatoid arthritis.   Subjective: Without chest pains this AM. Feeling better. Eager to go home soon  Hypokalemia -replaced -recheck bmet in AM  Physical Exam: Vitals:    09/30/22 1053 09/30/22 1224 09/30/22 1439 09/30/22 1443  BP:  (!) 173/93  (!) 147/94  Pulse:  87  67  Resp:  Temp: 98.1 F (36.7 C) 98.4 F (36.9 C)  97.9 F (36.6 C)  TempSrc: Oral Other (Comment)  Oral  SpO2:  97% 98% 98%  Weight:      Height:       General exam: Awake, laying in bed, in nad Respiratory system: Normal respiratory effort, no wheezing Cardiovascular system: regular rate, s1, s2 Gastrointestinal system: Soft, nondistended, positive BS Central nervous system: CN2-12 grossly intact, strength intact Extremities: Perfused, no clubbing Skin: Normal skin turgor, no notable skin lesions seen Psychiatry: Mood normal // no visual hallucinations   Data Reviewed:  Labs reviewed: Na 135, K 3.4, Cr 0.63  Family Communication: Pt in room, family not at bedside  Disposition: Status is: Observation The patient remains OBS appropriate and will d/c before 2 midnights.  Planned Discharge Destination: Home    Author: Rickey Barbara, MD 09/30/2022 6:07 PM  For on call review [email or web address].

## 2022-09-30 NOTE — Progress Notes (Signed)
Echocardiogram 2D Echocardiogram has been performed.  Toni Amend 09/30/2022, 3:40 PM

## 2022-09-30 NOTE — ED Notes (Signed)
ED TO INPATIENT HANDOFF REPORT  Name/Age/Gender Anthony Boyd 57 y.o. male  Code Status    Code Status Orders  (From admission, onward)           Start     Ordered   09/29/22 1922  Full code  Continuous       Question:  By:  Answer:  Consent: discussion documented in EHR   09/29/22 1924           Code Status History     Date Active Date Inactive Code Status Order ID Comments User Context   03/04/2019 1254 03/05/2019 1926 Full Code [id number]  Kathryne Hitch, MD Inpatient       Home/SNF/Other Home  Chief Complaint Chest pain [R07.9]  Level of Care/Admitting Diagnosis ED Disposition     ED Disposition  Admit   Condition  --   Comment  Hospital Area: [facility] [[id number]]  Level of Care: Telemetry [5]  Admit to tele based on following criteria: Monitor for Ischemic changes  May place patient in observation at [facility] or [facility] if equivalent level of care is available:: No  Covid Evaluation: Asymptomatic - no recent exposure (last 10 days) testing not required  Diagnosis: Chest pain [409811]  Admitting Physician: Jerald Kief [[id number]]  Attending Physician: Jerald Kief [phone]          Medical History Past Medical History:  Diagnosis Date   Allergy    Arthritis    Knees, 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   Hypertension    Osteoporosis    Rheumatoid arthritis(714.0)     Allergies No Known Allergies  IV Location/Drains/Wounds Patient Lines/Drains/Airways Status     Active Line/Drains/Airways     Name Placement date Placement time Site Days   Peripheral IV 09/29/22 20 G Right Antecubital 09/29/22  2000  Antecubital  1            Labs/Imaging Results for orders placed or performed during the hospital encounter of 09/29/22 (from the past 48 hour(s))  CBC with Differential     Status: Abnormal   Collection Time: 09/29/22 12:28 PM  Result Value Ref Range   WBC 7.1 4.0 - 10.5 K/uL   RBC 5.40 4.22 - 5.81 MIL/uL    Hemoglobin 13.8 13.0 - 17.0 g/dL   HCT 82.9 56.2 - 13.0 %   MCV 81.3 80.0 - 100.0 fL   MCH 25.6 (L) 26.0 - 34.0 pg   MCHC 31.4 30.0 - 36.0 g/dL   RDW 86.5 (H) 78.4 - 69.6 %   Platelets 278 150 - 400 K/uL   nRBC 0.0 0.0 - 0.2 %   Neutrophils Relative % 72 %   Neutro Abs 5.1 1.7 - 7.7 K/uL   Lymphocytes Relative 21 %   Lymphs Abs 1.5 0.7 - 4.0 K/uL   Monocytes Relative 6 %   Monocytes Absolute 0.5 0.1 - 1.0 K/uL   Eosinophils Relative 1 %   Eosinophils Absolute 0.1 0.0 - 0.5 K/uL   Basophils Relative 0 %   Basophils Absolute 0.0 0.0 - 0.1 K/uL   Immature Granulocytes 0 %   Abs Immature Granulocytes 0.02 0.00 - 0.07 K/uL    Comment: Performed at [facility], [address], [place], [place] [address]  Lipase, blood     Status: None   Collection Time: 09/29/22 12:28 PM  Result Value Ref Range   Lipase 29 11 - 51 U/L    Comment: Performed at [facility], [address] [place], [place] [address]  Basic metabolic panel     Status: Abnormal   Collection Time: 09/29/22 12:28 PM  Result Value Ref Range   Sodium 134 (L) 135 - 145 mmol/L   Potassium 4.1 3.5 - 5.1 mmol/L    Comment: HEMOLYSIS AT THIS LEVEL MAY AFFECT RESULT   Chloride 101 98 - 111 mmol/L   CO2 24 22 - 32 mmol/L   Glucose, Bld 95 70 - 99 mg/dL    Comment: Glucose reference range applies only to samples taken after fasting for at least 8 hours.   BUN 12 6 - 20 mg/dL   Creatinine, Ser 1.91 (L) 0.61 - 1.24 mg/dL   Calcium 8.8 (L) 8.9 - 10.3 mg/dL   GFR, Estimated >47 >82 mL/min    Comment: (NOTE) Calculated using the CKD-EPI Creatinine Equation (2021)    Anion gap 9 5 - 15    Comment: Performed at [facility], [address] [place], [place] [address]  Hepatic function panel     Status: Abnormal   Collection Time: 09/29/22 12:28 PM  Result Value Ref Range   Total Protein 7.7 6.5 - 8.1 g/dL   Albumin 3.1 (L) 3.5 - 5.0 g/dL   AST 81 (H) 15 - 41 U/L     Comment: HEMOLYSIS AT THIS LEVEL MAY AFFECT RESULT   ALT 53 (H) 0 - 44 U/L    Comment: HEMOLYSIS AT THIS LEVEL MAY AFFECT RESULT   Alkaline Phosphatase 65 38 - 126 U/L   Total Bilirubin 1.1 0.3 - 1.2 mg/dL    Comment: HEMOLYSIS AT THIS LEVEL MAY AFFECT RESULT   Bilirubin, Direct 0.4 (H) 0.0 - 0.2 mg/dL   Indirect Bilirubin 0.7 0.3 - 0.9 mg/dL    Comment: Performed at [facility], [address] [place], [place] [address]  Troponin I (High Sensitivity)     Status: Abnormal   Collection Time: 09/29/22 12:28 PM  Result Value Ref Range   Troponin I (High Sensitivity) 44 (H) <18 ng/L    Comment: (NOTE) Elevated high sensitivity troponin I (hsTnI) values and significant  changes across serial measurements may suggest ACS but many other  chronic and acute conditions are known to elevate hsTnI results.  Refer to the "Links" section for chest pain algorithms and additional  guidance. Performed at [facility], [address], [place], [place] [address]   Troponin I (High Sensitivity)     Status: Abnormal   Collection Time: 09/29/22  2:56 PM  Result Value Ref Range   Troponin I (High Sensitivity) 45 (H) <18 ng/L    Comment: (NOTE) Elevated high sensitivity troponin I (hsTnI) values and significant  changes across serial measurements may suggest ACS but many other  chronic and acute conditions are known to elevate hsTnI results.  Refer to the "Links" section for chest pain algorithms and additional  guidance. Performed at [facility], [address], [place], [place] [address]   Hemoglobin A1c     Status: Abnormal   Collection Time: 09/29/22  7:23 PM  Result Value Ref Range   Hgb A1c MFr Bld 5.9 (H) 4.8 - 5.6 %    Comment: (NOTE) Pre diabetes:          5.7%-6.4%  Diabetes:              >6.4%  Glycemic control for   <7.0% adults with diabetes    Mean Plasma Glucose  122.63 mg/dL    Comment: Performed at [facility], [address], [place], [place] [address]  Lipid panel     Status: Abnormal   Collection Time: 09/29/22  7:25 PM  Result Value Ref Range   Cholesterol 238 (H) 0 - 200 mg/dL   Triglycerides 81 <604 mg/dL   HDL 76 >54 mg/dL   Total CHOL/HDL Ratio 3.1 RATIO   VLDL 16 0 - 40 mg/dL   LDL Cholesterol 098 (H) 0 - 99 mg/dL    Comment:        Total Cholesterol/HDL:CHD Risk Coronary Heart Disease Risk Table                     Men   Women  1/2 Average Risk   3.4   3.3  Average Risk       5.0   4.4  2 X Average Risk   9.6   7.1  3 X Average Risk  23.4   11.0        Use the calculated Patient Ratio above and the CHD Risk Table to determine the patient's CHD Risk.        ATP III CLASSIFICATION (LDL):  <100     mg/dL   Optimal  119-147  mg/dL   Near or Above                    Optimal  130-159  mg/dL   Borderline  829-562  mg/dL   High  >130     mg/dL   Very High Performed at [facility], [address] [place], [place] [address]   HIV Antibody (routine testing w rflx)     Status: None   Collection Time: 09/29/22  8:00 PM  Result Value Ref Range   HIV Screen 4th Generation wRfx Non Reactive Non Reactive    Comment: Performed at [facility], [address], [place], [place] [address]  CBC     Status: Abnormal   Collection Time: 09/29/22  8:00 PM  Result Value Ref Range   WBC 6.5 4.0 - 10.5 K/uL   RBC 5.34 4.22 - 5.81 MIL/uL   Hemoglobin 13.4 13.0 - 17.0 g/dL   HCT 95.2 84.1 - 32.4 %   MCV 80.5 80.0 - 100.0 fL   MCH 25.1 (L) 26.0 - 34.0 pg   MCHC 31.2 30.0 - 36.0 g/dL   RDW 40.1 (H) 02.7 - 25.3 %   Platelets 289 150 - 400 K/uL   nRBC 0.0 0.0 - 0.2 %    Comment: Performed at [facility], [address], [place], [place] [address]  Creatinine, serum     Status: Abnormal   Collection Time: 09/29/22  8:00 PM  Result Value Ref Range   Creatinine, Ser 0.59 (L) 0.61 - 1.24 mg/dL   GFR, Estimated >34 >74 mL/min    Comment:  (NOTE) Calculated using the CKD-EPI Creatinine Equation (2021) Performed at [facility], [address], [place], [place] [address]   Magnesium     Status: None   Collection Time: 09/29/22  8:00 PM  Result Value Ref Range   Magnesium 2.0 1.7 - 2.4 mg/dL    Comment: Performed at [facility], [address] [place], [place] [address]  Phosphorus     Status: None   Collection Time: 09/29/22  8:00 PM  Result Value Ref Range   Phosphorus 3.6 2.5 -  4.6 mg/dL    Comment: HEMOLYSIS AT THIS LEVEL MAY AFFECT RESULT Performed at [facility], [address], [place], [place] [address]   Troponin I (High Sensitivity)     Status: Abnormal   Collection Time: 09/29/22 10:35 PM  Result Value Ref Range   Troponin I (High Sensitivity) 42 (H) <18 ng/L    Comment: (NOTE) Elevated high sensitivity troponin I (hsTnI) values and significant  changes across serial measurements may suggest ACS but many other  chronic and acute conditions are known to elevate hsTnI results.  Refer to the "Links" section for chest pain algorithms and additional  guidance. Performed at [facility], [address], [place], [place] [address]   TSH     Status: None   Collection Time: 09/30/22  1:20 AM  Result Value Ref Range   TSH 3.586 0.350 - 4.500 uIU/mL    Comment: Performed by a 3rd Generation assay with a functional sensitivity of <=0.01 uIU/mL. Performed at [facility], [address] [place], [place] [address]   Basic metabolic panel     Status: Abnormal   Collection Time: 09/30/22  1:20 AM  Result Value Ref Range   Sodium 135 135 - 145 mmol/L   Potassium 3.4 (L) 3.5 - 5.1 mmol/L   Chloride 100 98 - 111 mmol/L   CO2 26 22 - 32 mmol/L   Glucose, Bld 94 70 - 99 mg/dL    Comment: Glucose reference range applies only to samples taken after fasting for at least 8 hours.   BUN 11 6 - 20 mg/dL   Creatinine, Ser 2.13  0.61 - 1.24 mg/dL   Calcium 8.5 (L) 8.9 - 10.3 mg/dL   GFR, Estimated >08 >65 mL/min    Comment: (NOTE) Calculated using the CKD-EPI Creatinine Equation (2021)    Anion gap 9 5 - 15    Comment: Performed at [facility], [address] [place], [place] [address]  CBC     Status: Abnormal   Collection Time: 09/30/22  1:20 AM  Result Value Ref Range   WBC 5.8 4.0 - 10.5 K/uL   RBC 5.09 4.22 - 5.81 MIL/uL   Hemoglobin 13.0 13.0 - 17.0 g/dL   HCT 62.9 52.8 - 41.3 %   MCV 80.7 80.0 - 100.0 fL   MCH 25.5 (L) 26.0 - 34.0 pg   MCHC 31.6 30.0 - 36.0 g/dL   RDW 24.4 (H) 01.0 - 27.2 %   Platelets 265 150 - 400 K/uL   nRBC 0.0 0.0 - 0.2 %    Comment: Performed at [facility], [address] [place], [place] [address]  Troponin I (High Sensitivity)     Status: Abnormal   Collection Time: 09/30/22  1:20 AM  Result Value Ref Range   Troponin I (High Sensitivity) 38 (H) <18 ng/L    Comment: (NOTE) Elevated high sensitivity troponin I (hsTnI) values and significant  changes across serial measurements may suggest ACS but many other  chronic and acute conditions are known to elevate hsTnI results.  Refer to the "Links" section for chest pain algorithms and additional  guidance. Performed at [facility], [address] [place], [place] [address]    CT Angio Chest/Abd/Pel for Dissection W and/or Wo Contrast  Result Date: 09/29/2022 CLINICAL DATA:  Chest pain. EXAM: CT ANGIOGRAPHY CHEST, ABDOMEN AND PELVIS TECHNIQUE: Non-contrast CT of the chest was initially obtained. Multidetector CT imaging through the chest,  abdomen and pelvis was performed using the standard protocol during bolus administration of intravenous contrast. Multiplanar reconstructed images and MIPs were obtained and reviewed to evaluate the vascular anatomy. RADIATION DOSE REDUCTION: This exam was performed according to the departmental dose-optimization program which  includes automated exposure control, adjustment of the mA and/or kV according to patient size and/or use of iterative reconstruction technique. CONTRAST:  OMNIPAQUE IOHEXOL 350 MG/ML SOLN COMPARISON:  May 17, 2009. FINDINGS: CTA CHEST FINDINGS Cardiovascular: Preferential opacification of the thoracic aorta. No evidence of thoracic aortic aneurysm or dissection. Normal heart size. No pericardial effusion. Mediastinum/Nodes: No enlarged mediastinal, hilar, or axillary lymph nodes. Thyroid gland, trachea, and esophagus demonstrate no significant findings. Lungs/Pleura: No pneumothorax is noted. Minimal left pleural effusion is noted. Mild bibasilar subsegmental atelectasis is noted. Musculoskeletal: No chest wall abnormality. No acute or significant osseous findings. Review of the MIP images confirms the above findings. CTA ABDOMEN AND PELVIS FINDINGS VASCULAR Aorta: Normal caliber aorta without aneurysm, dissection, vasculitis or significant stenosis. Celiac: Patent without evidence of aneurysm, dissection, vasculitis or significant stenosis. SMA: Patent without evidence of aneurysm, dissection, vasculitis or significant stenosis. Renals: Bilateral renal arteries are patent without evidence of aneurysm, dissection, vasculitis, fibromuscular dysplasia or significant stenosis. IMA: Patent without evidence of aneurysm, dissection, vasculitis or significant stenosis. Inflow: Patent without evidence of aneurysm, dissection, vasculitis or significant stenosis. Veins: No obvious venous abnormality within the limitations of this arterial phase study. Review of the MIP images confirms the above findings. NON-VASCULAR Hepatobiliary: No focal liver abnormality is seen. No gallstones, gallbladder wall thickening, or biliary dilatation. Pancreas: Unremarkable. No pancreatic ductal dilatation or surrounding inflammatory changes. Spleen: Normal in size without focal abnormality. Adrenals/Urinary Tract: Adrenal glands are  unremarkable. Kidneys are normal, without renal calculi, focal lesion, or hydronephrosis. Bladder is unremarkable. Stomach/Bowel: Stomach is within normal limits. Appendix appears normal. No evidence of bowel wall thickening, distention, or inflammatory changes. Lymphatic: No significant adenopathy is noted. Reproductive: Mild prostatic enlargement. Other: Small fat containing right inguinal hernia. No ascites is noted. Musculoskeletal: No acute or significant osseous findings. Review of the MIP images confirms the above findings. IMPRESSION: No evidence of thoracic or abdominal aortic dissection or aneurysm. Minimal left pleural effusion is noted. Mild bibasilar subsegmental atelectasis is noted. Mild prostatic enlargement. Small fat containing right inguinal hernia. Electronically Signed   By: Lupita Raider M.D.   On: 09/29/2022 16:39   DG Chest Portable 1 View  Result Date: 09/29/2022 CLINICAL DATA:  Left-sided substernal chest pain EXAM: PORTABLE CHEST 1 VIEW COMPARISON:  05/08/2009 FINDINGS: Heart size upper limits of normal. Mild tortuous aorta. Increased markings at both lung bases that could be atelectasis or mild bibasilar pneumonia. Upper lungs are clear. No visible effusion. IMPRESSION: Increased markings at both lung bases that could be atelectasis or mild bibasilar pneumonia. Electronically Signed   By: Paulina Fusi M.D.   On: 09/29/2022 13:23    Pending Labs Unresulted Labs (From admission, onward)     Start     Ordered   10/06/22 0500  Creatinine, serum  (enoxaparin (LOVENOX)    CrCl >/= 30 ml/min)  Weekly,   R     Comments: while on enoxaparin therapy    09/29/22 1924            Vitals/Pain Today's Vitals   09/30/22 0645 09/30/22 0900 09/30/22 1053 09/30/22 1224  BP:  (!) 167/97  (!) 173/93  Pulse:  80  87  Resp:  16  16  Temp: 98.2 F (36.8 C)  98.1 F (36.7 C) 98.4 F (36.9 C)  TempSrc: Oral  Oral Other (Comment)  SpO2:  97%  97%  Weight:      Height:       PainSc:        Isolation Precautions No active isolations  Medications Medications  nitroGLYCERIN (NITROSTAT) SL tablet 0.4 mg (0.4 mg Sublingual Given 09/29/22 1710)  lidocaine (LIDODERM) 5 % 1 patch (1 patch Transdermal Patch Removed 09/30/22 1101)  enoxaparin (LOVENOX) injection 40 mg (40 mg Subcutaneous Given 09/29/22 2137)  0.9 %  sodium chloride infusion ( Intravenous New Bag/Given 09/29/22 2038)  acetaminophen (TYLENOL) tablet 650 mg (has no administration in time range)    Or  acetaminophen (TYLENOL) suppository 650 mg (has no administration in time range)  morphine (PF) 2 MG/ML injection 2 mg (has no administration in time range)  ondansetron (ZOFRAN) tablet 4 mg (has no administration in time range)    Or  ondansetron (ZOFRAN) injection 4 mg (has no administration in time range)  pantoprazole (PROTONIX) EC tablet 40 mg (40 mg Oral Given 09/30/22 0910)  celecoxib (CELEBREX) capsule 200 mg (200 mg Oral Given 09/30/22 0912)  folic acid (FOLVITE) tablet 1 mg (1 mg Oral Given 09/30/22 0910)  amLODipine (NORVASC) tablet 10 mg (10 mg Oral Given 09/30/22 0911)  leflunomide (ARAVA) tablet 20 mg (20 mg Oral Given 09/30/22 0911)  tiZANidine (ZANAFLEX) tablet 2 mg (has no administration in time range)  cycloSPORINE (RESTASIS) 0.05 % ophthalmic emulsion 1 drop (1 drop Both Eyes Given 09/30/22 0911)  dextromethorphan (DELSYM) 30 MG/5ML liquid 30 mg (has no administration in time range)  irbesartan (AVAPRO) tablet 300 mg (300 mg Oral Given 09/30/22 0912)  cloNIDine (CATAPRES) tablet 0.2 mg (0.2 mg Oral Given 09/29/22 1253)  famotidine (PEPCID) IVPB 20 mg premix (0 mg Intravenous Stopped 09/29/22 1342)  alum & mag hydroxide-simeth (MAALOX/MYLANTA) 200-200-20 MG/5ML suspension 30 mL (30 mLs Oral Given 09/29/22 1251)  amLODipine (NORVASC) tablet 10 mg (10 mg Oral Given 09/29/22 1255)  hydrochlorothiazide (HYDRODIURIL) tablet 12.5 mg (12.5 mg Oral Given 09/29/22 1255)  iohexol (OMNIPAQUE) 350 MG/ML  injection 100 mL (100 mLs Intravenous Contrast Given 09/29/22 1547)  ketorolac (TORADOL) 15 MG/ML injection 15 mg (15 mg Intravenous Given 09/29/22 1842)  potassium chloride SA (KLOR-CON M) CR tablet 60 mEq (60 mEq Oral Given 09/30/22 0910)  metoprolol tartrate (LOPRESSOR) tablet 100 mg (100 mg Oral Given 09/30/22 1209)    Mobility walks

## 2022-09-30 NOTE — ED Notes (Signed)
Care link arrived for patient to go to [facility] for CT.

## 2022-09-30 NOTE — Consult Note (Signed)
Cardiology Consultation   Patient ID: CALLEN VANCUREN MRN: [id number]; DOB: 08-26-1965  Admit date: 09/29/2022 Date of Consult: 09/30/2022  PCP:  Georgianne Fick, MD   [facility] Providers Cardiologist:  None        Patient Profile:   Anthony Boyd is a 57 y.o. male with a hx of hypertension, RA, pre-diabetes, GERD, obesity who is being seen 09/30/2022 for the evaluation of chest discomfort at the request of Dr. Gwenlyn Perking.  History of Present Illness:   Mr. Cruzan presented to the ED yesterday with chest pain that he first noticed on Saturday. Pain was felt in left chest, worsened with movement or coughing though not exacerbated by exertion. Describes it as almost as if something was stuck in his throat and slowly moving down. He also felt some generalized dyspnea with this pressure sensation. In the past few weeks, patient noticed unusual exertional dyspnea. He initially attributed to deconditioning 2/2 right ankle (has tentative surgery plans for August). Now wonders if it might be related to chest discomfort and/or high BP. Regarding BP, says that it has trended high over the past month despite medication changes by PCP. He admits to eating fast food at least 3 times per week. He does not smoke or drink.   Pain persisted through Sunday and when it worsened yesterday, he called his PCP who advised ED evaluation. Initial workup noted albumin 3.1, AST 81, ALT 53, direct bili 0.4. Troponin 44->45->42->38.   Past Medical History:  Diagnosis Date   Allergy    Arthritis    Knees, 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   Hypertension    Osteoporosis    Rheumatoid arthritis(714.0)     Past Surgical History:  Procedure Laterality Date   FOOT SURGERY  1998, 2001   INSERTION OF MESH N/A 04/29/2022   Procedure: INSERTION OF MESH;  Surgeon: Manus Rudd, MD;  Location: [facility];  Service: General;  Laterality: N/A;   KNEE SURGERY  2003   left   TOTAL KNEE ARTHROPLASTY Left  03/04/2019   Procedure: LEFT TOTAL KNEE ARTHROPLASTY;  Surgeon: Kathryne Hitch, MD;  Location: [facility];  Service: Orthopedics;  Laterality: Left;   UMBILICAL HERNIA REPAIR N/A 04/29/2022   Procedure: OPEN UMBILICAL HERNIA REPAIR WITH MESH;  Surgeon: Manus Rudd, MD;  Location: [facility];  Service: General;  Laterality: N/A;     Home Medications:  Prior to Admission medications   Medication Sig Start Date End Date Taking? Authorizing Provider  amLODipine (NORVASC) 10 MG tablet Take 10 mg by mouth daily.   Yes [provider]  celecoxib (CELEBREX) 200 MG capsule Take 200 mg by mouth 2 (two) times daily. 05/29/17  Yes [provider]  folic acid (FOLVITE) 1 MG tablet Take 1 mg by mouth daily. 04/02/17  Yes [provider]  golimumab (SIMPONI ARIA) 50 MG/4ML SOLN injection Inject 50 mg into the vein every 8 (eight) weeks.   Yes [provider]  leflunomide (ARAVA) 20 MG tablet Take 20 mg by mouth daily.   Yes [provider]  olmesartan-hydrochlorothiazide (BENICAR HCT) 40-12.5 MG tablet Take 1 tablet by mouth daily.   Yes [provider]  predniSONE (DELTASONE) 10 MG tablet Take 10 mg by mouth daily as needed (as directed for arthritis).   Yes [provider]  rosuvastatin (CRESTOR) 20 MG tablet Take 20 mg by mouth at bedtime.   Yes [provider]  TUMS 500 MG chewable tablet Chew 1-3 tablets  by mouth daily as needed for indigestion or heartburn.   Yes [provider]  UNKNOWN TO PATIENT Place 1 drop into both eyes See admin instructions. Unnamed eye drops- Place 1 drop into both eyes once a day   Yes [provider]    Inpatient Medications: Scheduled Meds:  amLODipine  10 mg Oral Daily   celecoxib  200 mg Oral BID   cycloSPORINE  1 drop Both Eyes BID   enoxaparin (LOVENOX) injection  40 mg Subcutaneous Q24H   folic acid  1 mg Oral Daily   irbesartan  300 mg Oral Daily   And    hydrochlorothiazide  12.5 mg Oral Daily   leflunomide  20 mg Oral Daily   lidocaine  1 patch Transdermal Q24H   metoprolol tartrate  100 mg Oral Once   pantoprazole  40 mg Oral BID   Continuous Infusions:  sodium chloride 75 mL/hr at 09/29/22 2038   PRN Meds: acetaminophen **OR** acetaminophen, dextromethorphan, morphine injection, nitroGLYCERIN, ondansetron **OR** ondansetron (ZOFRAN) IV, tiZANidine  Allergies:   No Known Allergies  Social History:   Social History   Socioeconomic History   Marital status: Significant Other    Spouse name: Not on file   Number of children: Not on file   Years of education: Not on file   Highest education level: Not on file  Occupational History   Not on file  Tobacco Use   Smoking status: Never   Smokeless tobacco: Never  Vaping Use   Vaping Use: Never used  Substance and Sexual Activity   Alcohol use: No   Drug use: No   Sexual activity: Not on file  Other Topics Concern   Not on file  Social History Narrative   Not on file   Social Determinants of Health   Financial Resource Strain: Not on file  Food Insecurity: Not on file  Transportation Needs: Not on file  Physical Activity: Not on file  Stress: Not on file  Social Connections: Not on file  Intimate Partner Violence: Not on file    Family History:    Family History  Problem Relation Age of Onset   Hypertension Mother    Hypertension Father      ROS:  Please see the history of present illness.   All other ROS reviewed and negative.     Physical Exam/Data:   Vitals:   09/30/22 0500 09/30/22 0600 09/30/22 0645 09/30/22 0900  BP: (!) 161/97 (!) 183/106  (!) 167/97  Pulse: 74 77  80  Resp: (!) Temp:   98.2 F (36.8 C)   TempSrc:   Oral   SpO2: 97% 96%  97%  Weight:      Height:        Intake/Output Summary (Last 24 hours) at 09/30/2022 1031 Last data filed at 09/29/2022 1342 Gross per 24 hour  Intake 43.39 ml  Output --  Net 43.39 ml       09/29/2022   12:16 PM 05/28/2022   12:54 PM 04/29/2022    7:10 AM  Last 3 Weights  Weight (lbs) 228 lb 231 lb 12.8 oz 228 lb 6.3 oz  Weight (kg) 103.42 kg 105.144 kg 103.6 kg     Body mass index is 35.71 kg/m.  General:  Well nourished, well developed, in no acute distress HEENT: normal Neck: no JVD Vascular: No carotid bruits; Distal pulses 2+ bilaterally Cardiac:  normal S1, S2; RRR; no murmur  Lungs:  clear to auscultation bilaterally, no wheezing, rhonchi or rales  Abd: soft, nontender, no hepatomegaly  Ext: no edema Musculoskeletal:  No deformities, BUE and BLE strength normal and equal Skin: warm and dry  Neuro:  CNs 2-12 intact, no focal abnormalities noted Psych:  Normal affect   EKG:  The EKG was personally reviewed and demonstrates:  sinus rhythm, no acute ischemic changes Telemetry:  Telemetry was personally reviewed and demonstrates:  sinus rhythm  Relevant CV Studies: Echocardiogram pending  Laboratory Data:  High Sensitivity Troponin:   Recent Labs  Lab 09/29/22 1228 09/29/22 1456 09/29/22 2235 09/30/22 0120  TROPONINIHS 44* 45* 42* 38*     Chemistry Recent Labs  Lab 09/29/22 1228 09/29/22 2000 09/30/22 0120  NA 134*  --  135  K 4.1  --  3.4*  CL 101  --  100  CO2 24  --  26  GLUCOSE 95  --  94  BUN 12  --  11  CREATININE 0.59* 0.59* 0.63  CALCIUM 8.8*  --  8.5*  MG  --  2.0  --   GFRNONAA >60 >60 >60  ANIONGAP 9  --  9    Recent Labs  Lab 09/29/22 1228  PROT 7.7  ALBUMIN 3.1*  AST 81*  ALT 53*  ALKPHOS 65  BILITOT 1.1   Lipids  Recent Labs  Lab 09/29/22 1925  CHOL 238*  TRIG 81  HDL 76  LDLCALC 146*  CHOLHDL 3.1    Hematology Recent Labs  Lab 09/29/22 1228 09/29/22 2000 09/30/22 0120  WBC 7.1 6.5 5.8  RBC 5.40 5.34 5.09  HGB 13.8 13.4 13.0  HCT 43.9 43.0 41.1  MCV 81.3 80.5 80.7  MCH 25.6* 25.1* 25.5*  MCHC 31.4 31.2 31.6  RDW 15.7* 15.7* 15.7*  PLT 278 289 265   Thyroid  Recent Labs  Lab  09/30/22 0120  TSH 3.586    BNPNo results for input(s): "BNP", "PROBNP" in the last 168 hours.  DDimer No results for input(s): "DDIMER" in the last 168 hours.   Radiology/Studies:  CT Angio Chest/Abd/Pel for Dissection W and/or Wo Contrast  Result Date: 09/29/2022 CLINICAL DATA:  Chest pain. EXAM: CT ANGIOGRAPHY CHEST, ABDOMEN AND PELVIS TECHNIQUE: Non-contrast CT of the chest was initially obtained. Multidetector CT imaging through the chest, abdomen and pelvis was performed using the standard protocol during bolus administration of intravenous contrast. Multiplanar reconstructed images and MIPs were obtained and reviewed to evaluate the vascular anatomy. RADIATION DOSE REDUCTION: This exam was performed according to the departmental dose-optimization program which includes automated exposure control, adjustment of the mA and/or kV according to patient size and/or use of iterative reconstruction technique. CONTRAST:  OMNIPAQUE IOHEXOL 350 MG/ML SOLN COMPARISON:  May 17, 2009. FINDINGS: CTA CHEST FINDINGS Cardiovascular: Preferential opacification of the thoracic aorta. No evidence of thoracic aortic aneurysm or dissection. Normal heart size. No pericardial effusion. Mediastinum/Nodes: No enlarged mediastinal, hilar, or axillary lymph nodes. Thyroid gland, trachea, and esophagus demonstrate no significant findings. Lungs/Pleura: No pneumothorax is noted. Minimal left pleural effusion is noted. Mild bibasilar subsegmental atelectasis is noted. Musculoskeletal: No chest wall abnormality. No acute or significant osseous findings. Review of the MIP images confirms the above findings. CTA ABDOMEN AND PELVIS FINDINGS VASCULAR Aorta: Normal caliber aorta without aneurysm, dissection, vasculitis or significant stenosis. Celiac: Patent without evidence of aneurysm, dissection, vasculitis or significant stenosis. SMA: Patent without evidence of aneurysm, dissection, vasculitis or significant stenosis.  Renals: Bilateral renal arteries are patent without evidence of  aneurysm, dissection, vasculitis, fibromuscular dysplasia or significant stenosis. IMA: Patent without evidence of aneurysm, dissection, vasculitis or significant stenosis. Inflow: Patent without evidence of aneurysm, dissection, vasculitis or significant stenosis. Veins: No obvious venous abnormality within the limitations of this arterial phase study. Review of the MIP images confirms the above findings. NON-VASCULAR Hepatobiliary: No focal liver abnormality is seen. No gallstones, gallbladder wall thickening, or biliary dilatation. Pancreas: Unremarkable. No pancreatic ductal dilatation or surrounding inflammatory changes. Spleen: Normal in size without focal abnormality. Adrenals/Urinary Tract: Adrenal glands are unremarkable. Kidneys are normal, without renal calculi, focal lesion, or hydronephrosis. Bladder is unremarkable. Stomach/Bowel: Stomach is within normal limits. Appendix appears normal. No evidence of bowel wall thickening, distention, or inflammatory changes. Lymphatic: No significant adenopathy is noted. Reproductive: Mild prostatic enlargement. Other: Small fat containing right inguinal hernia. No ascites is noted. Musculoskeletal: No acute or significant osseous findings. Review of the MIP images confirms the above findings. IMPRESSION: No evidence of thoracic or abdominal aortic dissection or aneurysm. Minimal left pleural effusion is noted. Mild bibasilar subsegmental atelectasis is noted. Mild prostatic enlargement. Small fat containing right inguinal hernia. Electronically Signed   By: Lupita Raider M.D.   On: 09/29/2022 16:39   DG Chest Portable 1 View  Result Date: 09/29/2022 CLINICAL DATA:  Left-sided substernal chest pain EXAM: PORTABLE CHEST 1 VIEW COMPARISON:  05/08/2009 FINDINGS: Heart size upper limits of normal. Mild tortuous aorta. Increased markings at both lung bases that could be atelectasis or mild bibasilar  pneumonia. Upper lungs are clear. No visible effusion. IMPRESSION: Increased markings at both lung bases that could be atelectasis or mild bibasilar pneumonia. Electronically Signed   By: Paulina Fusi M.D.   On: 09/29/2022 13:23     Assessment and Plan:   Atypical chest pain Exertional dyspnea Elevated troponin  Patient presented to the ED with chest discomfort x3 days, made worse with deep inspiration. Also notes increased shortness of breath with exertion over the past few weeks.   Overall mixed symptoms with reproducible chest pain but also exertional dyspnea, not clearly ACS. Troponin 44->45->42->38 may be secondary to uncontrolled hypertension. ECG without ischemic changes noted. Will plan to check echocardiogram today and cardiac CTA. Scheduled metoprolol ordered to facilitate scan. Focus should be on hypertension management   Hypertension  Patient notably hypertensive since arriving to the ED. Admits that BP has been up for the last month. This is despite compliance with home meds: Benicar 40-12.5mg , Amlodipine 10mg .   Patient may need referral to hypertension clinic for resistant hypertension. Likely needs sleep study as well.   Risk Assessment/Risk Scores:     TIMI Risk Score for Unstable Angina or Non-ST Elevation MI:   The patient's TIMI risk score is 1, which indicates a 5% risk of all cause mortality, new or recurrent myocardial infarction or need for urgent revascularization in the next 14 days.          For questions or updates, please contact [facility] Please consult [email or web address] for contact info under    Signed, Perlie Gold, PA-C  09/30/2022 10:31 AM

## 2022-10-01 DIAGNOSIS — R7303 Prediabetes: Secondary | ICD-10-CM | POA: Diagnosis not present

## 2022-10-01 DIAGNOSIS — R0789 Other chest pain: Secondary | ICD-10-CM | POA: Diagnosis not present

## 2022-10-01 DIAGNOSIS — R079 Chest pain, unspecified: Secondary | ICD-10-CM | POA: Diagnosis not present

## 2022-10-01 DIAGNOSIS — M0579 Rheumatoid arthritis with rheumatoid factor of multiple sites without organ or systems involvement: Secondary | ICD-10-CM | POA: Diagnosis not present

## 2022-10-01 DIAGNOSIS — I10 Essential (primary) hypertension: Secondary | ICD-10-CM | POA: Diagnosis not present

## 2022-10-01 DIAGNOSIS — E669 Obesity, unspecified: Secondary | ICD-10-CM | POA: Diagnosis not present

## 2022-10-01 LAB — CBC
HCT: 45.2 % (ref 39.0–52.0)
Hemoglobin: 14.1 g/dL (ref 13.0–17.0)
MCH: 25.5 pg — ABNORMAL LOW (ref 26.0–34.0)
MCHC: 31.2 g/dL (ref 30.0–36.0)
MCV: 81.6 fL (ref 80.0–100.0)
Platelets: 311 10*3/uL (ref 150–400)
RBC: 5.54 MIL/uL (ref 4.22–5.81)
RDW: 15.4 % (ref 11.5–15.5)
WBC: 5.9 10*3/uL (ref 4.0–10.5)
nRBC: 0 % (ref 0.0–0.2)

## 2022-10-01 LAB — COMPREHENSIVE METABOLIC PANEL
ALT: 40 U/L (ref 0–44)
AST: 55 U/L — ABNORMAL HIGH (ref 15–41)
Albumin: 2.8 g/dL — ABNORMAL LOW (ref 3.5–5.0)
Alkaline Phosphatase: 61 U/L (ref 38–126)
Anion gap: 10 (ref 5–15)
BUN: 17 mg/dL (ref 6–20)
CO2: 24 mmol/L (ref 22–32)
Calcium: 8.5 mg/dL — ABNORMAL LOW (ref 8.9–10.3)
Chloride: 99 mmol/L (ref 98–111)
Creatinine, Ser: 0.76 mg/dL (ref 0.61–1.24)
GFR, Estimated: >60 mL/min (ref >60)
Glucose, Bld: 84 mg/dL (ref 70–99)
Potassium: 4.1 mmol/L (ref 3.5–5.1)
Sodium: 133 mmol/L — ABNORMAL LOW (ref 135–145)
Total Bilirubin: 0.9 mg/dL (ref 0.3–1.2)
Total Protein: 7.3 g/dL (ref 6.5–8.1)

## 2022-10-01 MED ORDER — IRBESARTAN 300 MG PO TABS: 300.0000 mg | ORAL_TABLET | Freq: Every day | ORAL | 0 refills | Status: AC

## 2022-10-01 MED ORDER — CYCLOSPORINE 0.05 % OP EMUL: 1.0000 [drp] | Freq: Two times a day (BID) | OPHTHALMIC | 0 refills | Status: AC

## 2022-10-01 MED ORDER — PANTOPRAZOLE SODIUM 40 MG PO TBEC: 40.0000 mg | DELAYED_RELEASE_TABLET | Freq: Two times a day (BID) | ORAL | 0 refills | Status: DC

## 2022-10-01 MED ORDER — TIZANIDINE HCL 2 MG PO TABS: 2.0000 mg | ORAL_TABLET | Freq: Three times a day (TID) | ORAL | 0 refills | Status: AC | PRN

## 2022-10-01 NOTE — Discharge Summary (Signed)
Physician Discharge Summary  Anthony Boyd OZH:[id number] DOB: 1965-09-12 DOA: 09/29/2022  PCP: Georgianne Fick, MD  Admit date: 09/29/2022 Discharge date: 10/01/2022  Admitted From: Home: Disposition: Home  Recommendations for Outpatient Follow-up:  Follow up with PCP in 1-2 weeks No indication for cardiology follow-up at this time however if symptoms recur it would be reasonable to reevaluate in the future. Repeat labs with PCP in the next 1 to 2 weeks  Home Health: None Equipment/Devices: None  Discharge Condition: Stable CODE STATUS: Full Diet recommendation:  Low salt low fat  Brief/Interim Summary: 57 y.o. male with medical history significant of class II obesity, hypertension, rheumatoid arthritis, prediabetes and GERD; who presented to the hospital secondary to chest pain.   Patient admitted as above with atypical chest pain, evaluated extensively by cardiology with no overt or worrisome findings.  Patient did have moderately uncontrolled blood pressure throughout but otherwise no further indications for cardiac workup or evaluation.  Recommend close follow-up with PCP in the near future to further discuss home medications as well as discussed medication compliance and recurrent symptoms.  Discharge Diagnoses:  Principal Problem:   Chest pain Active Problems:   Essential hypertension   Pre-diabetes   Rheumatoid arthritis of multiple sites without organ or system involvement with positive rheumatoid factor   Class 2 obesity  Atypical chest pain, resolved, ACS ruled out  Class 2 obesity -Body mass index is 35.71 kg/m. -recommend diet/lifestyle modification   Rheumatoid arthritis of multiple sites without organ or system involvement with positive rheumatoid factor -Continue home analgesic regimen and the use of leflunomide. -Patient will also continue every 3 weeks injections of Golimumab and outpatient follow up with rheumatology service. -Request close  follow-up with PCP for further discussion about as needed prednisone   Pre-diabetes -A1c 5.9 -diet/lifestyle modification discussed   Essential hypertension -See medication changes as below.   Elevated LFTs -Mild elevation -Likely secondary to previous treatment with methotrexate.   Discharge Instructions  Discharge Instructions     Ambulatory referral to Cardiology   Complete by: As directed    Chest pain      Allergies as of 10/01/2022   No Known Allergies      Medication List     STOP taking these medications    olmesartan-hydrochlorothiazide 40-12.5 MG tablet Commonly known as: BENICAR HCT   predniSONE 10 MG tablet Commonly known as: DELTASONE       TAKE these medications    amLODipine 10 MG tablet Commonly known as: NORVASC Take 10 mg by mouth daily.   celecoxib 200 MG capsule Commonly known as: CELEBREX Take 200 mg by mouth 2 (two) times daily.   cycloSPORINE 0.05 % ophthalmic emulsion Commonly known as: Restasis Place 1 drop into both eyes 2 (two) times daily. What changed:  how much to take how to take this when to take this   folic acid 1 MG tablet Commonly known as: FOLVITE Take 1 mg by mouth daily.   irbesartan 300 MG tablet Commonly known as: AVAPRO Take 1 tablet (300 mg total) by mouth daily. Start taking on: October 02, 2022   leflunomide 20 MG tablet Commonly known as: ARAVA Take 20 mg by mouth daily.   pantoprazole 40 MG tablet Commonly known as: PROTONIX Take 1 tablet (40 mg total) by mouth 2 (two) times daily.   rosuvastatin 20 MG tablet Commonly known as: CRESTOR Take 20 mg by mouth at bedtime.   Simponi Aria 50 MG/4ML Soln injection Generic drug: golimumab Inject  50 mg into the vein every 8 (eight) weeks.   tiZANidine 2 MG tablet Commonly known as: ZANAFLEX Take 1 tablet (2 mg total) by mouth 3 (three) times daily as needed.   Tums 500 MG chewable tablet Generic drug: calcium carbonate Chew 1-3 tablets by  mouth daily as needed for indigestion or heartburn.   UNKNOWN TO PATIENT Place 1 drop into both eyes See admin instructions. Unnamed eye drops- Place 1 drop into both eyes once a day        Follow-up Information     [facility] [place].   Contact information: [address] [place] [address] [phone]        Go to  [facility] Emergency Department at [facility].   Specialty: Emergency Medicine Why: As needed Contact information: [address] [place] [id number] mc [place] [address] [phone]               No Known Allergies  Consultations: Cardiology  Procedures/Studies: CT CORONARY MORPH W/CTA COR W/SCORE W/CA W/CM &/OR WO/CM  Addendum Date: 10/01/2022   ADDENDUM REPORT: 10/01/2022 08:13 EXAM: OVER-READ INTERPRETATION  CT CHEST The following report is an over-read performed by radiologist Dr. Karle Barr [facility] on 10/01/2022. This over-read does not include interpretation of cardiac or coronary anatomy or pathology. The coronary CTA interpretation by the cardiologist is attached. COMPARISON:  CT chest 09/29/2022 FINDINGS: Aorta normal caliber. No pericardial effusion. Central pulmonary arteries grossly patent. Esophagus unremarkable. Mildly enlarged RIGHT infrahilar node 11 mm image 21. Upper additional mildly enlarged azygo esophageal recess node 13 mm. Normal sized LEFT infrahilar node. Visualized upper abdomen unremarkable. Small LEFT pleural effusion and LEFT basilar atelectasis. Central peribronchial thickening. Minimal subsegmental atelectasis RIGHT base as well. No basilar infiltrate or pneumothorax. Osseous structures unremarkable. IMPRESSION: Small LEFT pleural effusion and LEFT basilar atelectasis. Bronchitic changes and minimal RIGHT basilar atelectasis. Nonspecific mild adenopathy. Electronically Signed   By: Ulyses Southward M.D.   On:  10/01/2022 08:13   Result Date: 10/01/2022 CLINICAL DATA:  Chest pain EXAM: Cardiac CTA MEDICATIONS: Sub lingual nitro.  x 2 TECHNIQUE: The patient was scanned on a Siemens 192 slice scanner. Gantry rotation speed was 250 msecs. Collimation was 0.6 mm. A 100 kV prospective scan was triggered in the ascending thoracic aorta at 35-75% of the R-R interval. Average HR during the scan was 60 bpm. The 3D data set was interpreted on a dedicated work station using MPR, MIP and VRT modes. A total of 80cc of contrast was used. FINDINGS: Non-cardiac: See separate report from [facility]. No LA appendage thrombus, pulmonary veins drain normally to the left atrium. Calcium Score: 0 Agatston units Coronary Arteries: Right dominant with no anomalies LM: No plaque or stenosis. LAD system:  No plaque or stenosis. Circumflex system: No plaque or stenosis. RCA system:  No plaque or stenosis. IMPRESSION: 1. Coronary calcium score 0 Agatston units, suggesting low risk for future cardiac events. 2.  No significant CAD noted. [employer] promotion account executive Electronically Signed: By: Marca Ancona M.D. On: 10/01/2022 08:06   ECHOCARDIOGRAM COMPLETE  Result Date: 09/30/2022    ECHOCARDIOGRAM REPORT   Patient Name:   WREN GALLAGA Date of Exam: 09/30/2022 Medical Rec #:  [id number]        Height:       67.0 in Accession #:    [id number]       Weight:  228.0 lb Date of Birth:  [birth year]        BSA:          2.138 m Patient Age:    56 years         BP:           147/94 mmHg Patient Gender: M                HR:           68 bpm. Exam Location:  Inpatient Procedure: 2D Echo, Color Doppler and Cardiac Doppler Indications:    R07.9* Chest pain, unspecified  History:        Patient has no prior history of Echocardiogram examinations.                 Risk Factors:Hypertension.  Sonographer:    Mike Gip Referring Phys: [phone] CARLOS MADERA IMPRESSIONS  1. Left ventricular ejection fraction, by estimation, is 60 to 65%. The left ventricle  has normal function. The left ventricle has no regional wall motion abnormalities. There is mild concentric left ventricular hypertrophy. Left ventricular diastolic parameters are consistent with Grade I diastolic dysfunction (impaired relaxation).  2. Right ventricular systolic function is normal. The right ventricular size is mildly enlarged.  3. Left atrial size was mildly dilated.  4. The mitral valve is normal in structure. Mild mitral valve regurgitation. No evidence of mitral stenosis.  5. The aortic valve is tricuspid. Aortic valve regurgitation is not visualized. No aortic stenosis is present.  6. The inferior vena cava is dilated in size with >50% respiratory variability, suggesting right atrial pressure of 8 mmHg. FINDINGS  Left Ventricle: Left ventricular ejection fraction, by estimation, is 60 to 65%. The left ventricle has normal function. The left ventricle has no regional wall motion abnormalities. The left ventricular internal cavity size was normal in size. There is  mild concentric left ventricular hypertrophy. Left ventricular diastolic parameters are consistent with Grade I diastolic dysfunction (impaired relaxation). Normal left ventricular filling pressure. Right Ventricle: The right ventricular size is mildly enlarged. No increase in right ventricular wall thickness. Right ventricular systolic function is normal. Left Atrium: Left atrial size was mildly dilated. Right Atrium: Right atrial size was normal in size. Pericardium: There is no evidence of pericardial effusion. Mitral Valve: The mitral valve is normal in structure. Mild mitral valve regurgitation, with centrally-directed jet. No evidence of mitral valve stenosis. Tricuspid Valve: The tricuspid valve is normal in structure. Tricuspid valve regurgitation is not demonstrated. Aortic Valve: The aortic valve is tricuspid. Aortic valve regurgitation is not visualized. No aortic stenosis is present. Pulmonic Valve: The pulmonic valve was  grossly normal. Pulmonic valve regurgitation is not visualized. Aorta: The aortic root and ascending aorta are structurally normal, with no evidence of dilitation. Venous: The inferior vena cava is dilated in size with greater than 50% respiratory variability, suggesting right atrial pressure of 8 mmHg. IAS/Shunts: No atrial level shunt detected by color flow Doppler.  LEFT VENTRICLE PLAX 2D LVIDd:         4.20 cm      Diastology LVIDs:         2.90 cm      LV e' medial:    5.66 cm/s LV PW:         1.20 cm      LV E/e' medial:  9.6 LV IVS:        1.20 cm      LV e' lateral:   5.77  cm/s LVOT diam:     2.20 cm      LV E/e' lateral: 9.4 LV SV:         82 LV SV Index:   38 LVOT Area:     3.80 cm  LV Volumes (MOD) LV vol d, MOD A2C: 113.0 ml LV vol d, MOD A4C: 92.6 ml LV vol s, MOD A2C: 44.3 ml LV vol s, MOD A4C: 32.3 ml LV SV MOD A2C:     68.7 ml LV SV MOD A4C:     92.6 ml LV SV MOD BP:      67.5 ml RIGHT VENTRICLE             IVC RV Basal diam:  4.50 cm     IVC diam: 2.10 cm RV S prime:     11.70 cm/s TAPSE (M-mode): 1.7 cm LEFT ATRIUM             Index        RIGHT ATRIUM           Index LA diam:        3.70 cm 1.73 cm/m   RA Area:     19.90 cm LA Vol (A2C):   66.9 ml 31.29 ml/m  RA Volume:   60.70 ml  28.39 ml/m LA Vol (A4C):   62.2 ml 29.09 ml/m LA Biplane Vol: 64.3 ml 30.07 ml/m  AORTIC VALVE LVOT Vmax:   94.80 cm/s LVOT Vmean:  68.800 cm/s LVOT VTI:    0.215 m  AORTA Ao Root diam: 3.50 cm Ao Asc diam:  3.30 cm MITRAL VALVE MV Area (PHT): 2.62 cm    SHUNTS MV Decel Time: 290 msec    Systemic VTI:  0.22 m MV E velocity: 54.40 cm/s  Systemic Diam: 2.20 cm MV A velocity: 89.50 cm/s MV E/A ratio:  0.61 Mihai Croitoru MD Electronically signed by Thurmon Fair MD Signature Date/Time: 09/30/2022/4:01:20 PM    Final    CT Angio Chest/Abd/Pel for Dissection W and/or Wo Contrast  Result Date: 09/29/2022 CLINICAL DATA:  Chest pain. EXAM: CT ANGIOGRAPHY CHEST, ABDOMEN AND PELVIS TECHNIQUE: Non-contrast CT of the  chest was initially obtained. Multidetector CT imaging through the chest, abdomen and pelvis was performed using the standard protocol during bolus administration of intravenous contrast. Multiplanar reconstructed images and MIPs were obtained and reviewed to evaluate the vascular anatomy. RADIATION DOSE REDUCTION: This exam was performed according to the departmental dose-optimization program which includes automated exposure control, adjustment of the mA and/or kV according to patient size and/or use of iterative reconstruction technique. CONTRAST:  OMNIPAQUE IOHEXOL 350 MG/ML SOLN COMPARISON:  May 17, 2009. FINDINGS: CTA CHEST FINDINGS Cardiovascular: Preferential opacification of the thoracic aorta. No evidence of thoracic aortic aneurysm or dissection. Normal heart size. No pericardial effusion. Mediastinum/Nodes: No enlarged mediastinal, hilar, or axillary lymph nodes. Thyroid gland, trachea, and esophagus demonstrate no significant findings. Lungs/Pleura: No pneumothorax is noted. Minimal left pleural effusion is noted. Mild bibasilar subsegmental atelectasis is noted. Musculoskeletal: No chest wall abnormality. No acute or significant osseous findings. Review of the MIP images confirms the above findings. CTA ABDOMEN AND PELVIS FINDINGS VASCULAR Aorta: Normal caliber aorta without aneurysm, dissection, vasculitis or significant stenosis. Celiac: Patent without evidence of aneurysm, dissection, vasculitis or significant stenosis. SMA: Patent without evidence of aneurysm, dissection, vasculitis or significant stenosis. Renals: Bilateral renal arteries are patent without evidence of aneurysm, dissection, vasculitis, fibromuscular dysplasia or significant stenosis. IMA: Patent without evidence of aneurysm, dissection, vasculitis  or significant stenosis. Inflow: Patent without evidence of aneurysm, dissection, vasculitis or significant stenosis. Veins: No obvious venous abnormality within the  limitations of this arterial phase study. Review of the MIP images confirms the above findings. NON-VASCULAR Hepatobiliary: No focal liver abnormality is seen. No gallstones, gallbladder wall thickening, or biliary dilatation. Pancreas: Unremarkable. No pancreatic ductal dilatation or surrounding inflammatory changes. Spleen: Normal in size without focal abnormality. Adrenals/Urinary Tract: Adrenal glands are unremarkable. Kidneys are normal, without renal calculi, focal lesion, or hydronephrosis. Bladder is unremarkable. Stomach/Bowel: Stomach is within normal limits. Appendix appears normal. No evidence of bowel wall thickening, distention, or inflammatory changes. Lymphatic: No significant adenopathy is noted. Reproductive: Mild prostatic enlargement. Other: Small fat containing right inguinal hernia. No ascites is noted. Musculoskeletal: No acute or significant osseous findings. Review of the MIP images confirms the above findings. IMPRESSION: No evidence of thoracic or abdominal aortic dissection or aneurysm. Minimal left pleural effusion is noted. Mild bibasilar subsegmental atelectasis is noted. Mild prostatic enlargement. Small fat containing right inguinal hernia. Electronically Signed   By: Lupita Raider M.D.   On: 09/29/2022 16:39   DG Chest Portable 1 View  Result Date: 09/29/2022 CLINICAL DATA:  Left-sided substernal chest pain EXAM: PORTABLE CHEST 1 VIEW COMPARISON:  05/08/2009 FINDINGS: Heart size upper limits of normal. Mild tortuous aorta. Increased markings at both lung bases that could be atelectasis or mild bibasilar pneumonia. Upper lungs are clear. No visible effusion. IMPRESSION: Increased markings at both lung bases that could be atelectasis or mild bibasilar pneumonia. Electronically Signed   By: Paulina Fusi M.D.   On: 09/29/2022 13:23   DG Knee Complete 4 Views Right  Result Date: 09/05/2022 CLINICAL DATA:  Pain.  Fall. EXAM: RIGHT KNEE - COMPLETE 4+ VIEW COMPARISON:  None  Available. FINDINGS: Tricompartmental degenerative changes identified with significant loss of joint space medially. Loose bodies in the suprapatellar space. No joint effusion. No other suspicious abnormalities. IMPRESSION: 1. Tricompartmental degenerative changes with significant loss of joint space medially. 2. Loose bodies in the suprapatellar space. Electronically Signed   By: Gerome Sam III M.D.   On: 09/05/2022 14:45     Subjective: No acute issues or events overnight   Discharge Exam: Vitals:   10/01/22 0835 10/01/22 1229  BP: 127/67 (!) 143/93  Pulse: 70 76  Resp: 20 18  Temp:  98.9 F (37.2 C)  SpO2: 97% 96%   Vitals:   10/01/22 0226 10/01/22 0603 10/01/22 0835 10/01/22 1229  BP: 120/84 138/87 127/67 (!) 143/93  Pulse: 70 68 70 76  Resp: 20 18 20 18   Temp: 97.6 F (36.4 C) 98.2 F (36.8 C)  98.9 F (37.2 C)  TempSrc: Oral Oral  Oral  SpO2: 97% 97% 97% 96%  Weight:      Height:        General: Pt is alert, awake, not in acute distress Cardiovascular: RRR, S1/S2 +, no rubs, no gallops Respiratory: CTA bilaterally, no wheezing, no rhonchi Abdominal: Soft, NT, ND, bowel sounds + Extremities: no edema, no cyanosis    The results of significant diagnostics from this hospitalization (including imaging, microbiology, ancillary and laboratory) are listed below for reference.     Microbiology: No results found for this or any previous visit (from the past 240 hour(s)).   Labs: BNP (last 3 results) No results for input(s): "BNP" in the last 8760 hours. Basic Metabolic Panel: Recent Labs  Lab 09/29/22 1228 09/29/22 2000 09/30/22 0120 10/01/22 0508  NA 134*  --  135 133*  K 4.1  --  3.4* 4.1  CL 101  --  100 99  CO2 24  --  26 24  GLUCOSE 95  --  94 84  BUN 12  --  11 17  CREATININE 0.59* 0.59* 0.63 0.76  CALCIUM 8.8*  --  8.5* 8.5*  MG  --  2.0  --   --   PHOS  --  3.6  --   --    Liver Function Tests: Recent Labs  Lab 09/29/22 1228  10/01/22 0508  AST 81* 55*  ALT 53* 40  ALKPHOS 65 61  BILITOT 1.1 0.9  PROT 7.7 7.3  ALBUMIN 3.1* 2.8*   Recent Labs  Lab 09/29/22 1228  LIPASE 29   No results for input(s): "AMMONIA" in the last 168 hours. CBC: Recent Labs  Lab 09/29/22 1228 09/29/22 2000 09/30/22 0120 10/01/22 0508  WBC 7.1 6.5 5.8 5.9  NEUTROABS 5.1  --   --   --   HGB 13.8 13.4 13.0 14.1  HCT 43.9 43.0 41.1 45.2  MCV 81.3 80.5 80.7 81.6  PLT 278 289 265 311   Hgb A1c Recent Labs    09/29/22 1923  HGBA1C 5.9*   Lipid Profile Recent Labs    09/29/22 1925  CHOL 238*  HDL 76  LDLCALC 146*  TRIG 81  CHOLHDL 3.1   Thyroid function studies Recent Labs    09/30/22 0120  TSH 3.586   Urinalysis    Component Value Date/Time   LABSPEC 1.025 05/08/2009 1926   PHURINE 6.0 05/08/2009 1926   GLUCOSEU NEGATIVE 05/08/2009 1926   HGBUR MODERATE (A) 05/08/2009 1926   BILIRUBINUR NEGATIVE 05/08/2009 1926   KETONESUR 15 (A) 05/08/2009 1926   PROTEINUR 100 (A) 05/08/2009 1926   UROBILINOGEN 0.2 05/08/2009 1926   NITRITE NEGATIVE 05/08/2009 1926   LEUKOCYTESUR  05/08/2009 1926    NEGATIVE Biochemical Testing Only. Please order routine urinalysis from main lab if confirmatory testing is needed.   Sepsis Labs Recent Labs  Lab 09/29/22 1228 09/29/22 2000 09/30/22 0120 10/01/22 0508  WBC 7.1 6.5 5.8 5.9   Time coordinating discharge: Over 30 minutes  SIGNED:   Azucena Fallen, DO [facility] 10/01/2022, 1:40 PM Pager   If 7PM-7AM, please contact night-coverage [email or web address]

## 2022-10-01 NOTE — Progress Notes (Signed)
Rounding Note    Patient Name: Anthony Boyd Date of Encounter: 10/01/2022  [facility] Cardiologist: None   Subjective   No acute overnight events. No recurrent chest pain. No shortness of breath. Reviewed coronary CTA and Echo results from yesterday.   Inpatient Medications    Scheduled Meds:  amLODipine  10 mg Oral Daily   celecoxib  200 mg Oral BID   cycloSPORINE  1 drop Both Eyes BID   enoxaparin (LOVENOX) injection  40 mg Subcutaneous Q24H   folic acid  1 mg Oral Daily   irbesartan  300 mg Oral Daily   leflunomide  20 mg Oral Daily   lidocaine  1 patch Transdermal Q24H   pantoprazole  40 mg Oral BID   Continuous Infusions:  PRN Meds: acetaminophen **OR** acetaminophen, dextromethorphan, morphine injection, nitroGLYCERIN, ondansetron **OR** ondansetron (ZOFRAN) IV, tiZANidine   Vital Signs    Vitals:   09/30/22 2241 10/01/22 0226 10/01/22 0603 10/01/22 0835  BP: 123/82 120/84 138/87 127/67  Pulse: 72 70 68 70  Resp: 20 20 18 20   Temp: 98.2 F (36.8 C) 97.6 F (36.4 C) 98.2 F (36.8 C)   TempSrc: Oral Oral Oral   SpO2: 97% 97% 97% 97%  Weight:      Height:        Intake/Output Summary (Last 24 hours) at 10/01/2022 1202 Last data filed at 10/01/2022 1035 Gross per 24 hour  Intake 1360.08 ml  Output --  Net 1360.08 ml      09/29/2022   12:16 PM 05/28/2022   12:54 PM 04/29/2022    7:10 AM  Last 3 Weights  Weight (lbs) 228 lb 231 lb 12.8 oz 228 lb 6.3 oz  Weight (kg) 103.42 kg 105.144 kg 103.6 kg      Telemetry    Normal sinus rhythm with rates in the 60s to 70s. Occasional PVCs - Personally Reviewed  ECG    No new ECG tracing today. - Personally Reviewed  Physical Exam   GEN: No acute distress.   Neck: No JVD. Cardiac: RRR. No murmurs, rubs, or gallops.  Respiratory: Clear to auscultation bilaterally. No wheezes, rhonchi, or rales. GI: Soft, non-distended, and non-tender. MS: No lower extremity edema. No deformity. Skin:  Warm and dry. Neuro:  No focal deficits.  Psych: Normal affect. Responds appropriately.  Labs    High Sensitivity Troponin:   Recent Labs  Lab 09/29/22 1228 09/29/22 1456 09/29/22 2235 09/30/22 0120  TROPONINIHS 44* 45* 42* 38*     Chemistry Recent Labs  Lab 09/29/22 1228 09/29/22 2000 09/30/22 0120 10/01/22 0508  NA 134*  --  135 133*  K 4.1  --  3.4* 4.1  CL 101  --  100 99  CO2 24  --  26 24  GLUCOSE 95  --  94 84  BUN 12  --  11 17  CREATININE 0.59* 0.59* 0.63 0.76  CALCIUM 8.8*  --  8.5* 8.5*  MG  --  2.0  --   --   PROT 7.7  --   --  7.3  ALBUMIN 3.1*  --   --  2.8*  AST 81*  --   --  55*  ALT 53*  --   --  40  ALKPHOS 65  --   --  61  BILITOT 1.1  --   --  0.9  GFRNONAA >60 >60 >60 >60  ANIONGAP 9  --  9 10    Lipids  Recent  Labs  Lab 09/29/22 1925  CHOL 238*  TRIG 81  HDL 76  LDLCALC 146*  CHOLHDL 3.1    Hematology Recent Labs  Lab 09/29/22 2000 09/30/22 0120 10/01/22 0508  WBC 6.5 5.8 5.9  RBC 5.34 5.09 5.54  HGB 13.4 13.0 14.1  HCT 43.0 41.1 45.2  MCV 80.5 80.7 81.6  MCH 25.1* 25.5* 25.5*  MCHC 31.2 31.6 31.2  RDW 15.7* 15.7* 15.4  PLT 289 265 311   Thyroid  Recent Labs  Lab 09/30/22 0120  TSH 3.586    BNPNo results for input(s): "BNP", "PROBNP" in the last 168 hours.  DDimer No results for input(s): "DDIMER" in the last 168 hours.   Radiology    CT CORONARY MORPH W/CTA COR W/SCORE W/CA W/CM &/OR WO/CM  Addendum Date: 10/01/2022   ADDENDUM REPORT: 10/01/2022 08:13 EXAM: OVER-READ INTERPRETATION  CT CHEST The following report is an over-read performed by radiologist Dr. Karle Barr [facility] on 10/01/2022. This over-read does not include interpretation of cardiac or coronary anatomy or pathology. The coronary CTA interpretation by the cardiologist is attached. COMPARISON:  CT chest 09/29/2022 FINDINGS: Aorta normal caliber. No pericardial effusion. Central pulmonary arteries grossly patent. Esophagus unremarkable.  Mildly enlarged RIGHT infrahilar node 11 mm image 21. Upper additional mildly enlarged azygo esophageal recess node 13 mm. Normal sized LEFT infrahilar node. Visualized upper abdomen unremarkable. Small LEFT pleural effusion and LEFT basilar atelectasis. Central peribronchial thickening. Minimal subsegmental atelectasis RIGHT base as well. No basilar infiltrate or pneumothorax. Osseous structures unremarkable. IMPRESSION: Small LEFT pleural effusion and LEFT basilar atelectasis. Bronchitic changes and minimal RIGHT basilar atelectasis. Nonspecific mild adenopathy. Electronically Signed   By: Ulyses Southward M.D.   On: 10/01/2022 08:13   Result Date: 10/01/2022 CLINICAL DATA:  Chest pain EXAM: Cardiac CTA MEDICATIONS: Sub lingual nitro.  x 2 TECHNIQUE: The patient was scanned on a Siemens 192 slice scanner. Gantry rotation speed was 250 msecs. Collimation was 0.6 mm. A 100 kV prospective scan was triggered in the ascending thoracic aorta at 35-75% of the R-R interval. Average HR during the scan was 60 bpm. The 3D data set was interpreted on a dedicated work station using MPR, MIP and VRT modes. A total of 80cc of contrast was used. FINDINGS: Non-cardiac: See separate report from [facility]. No LA appendage thrombus, pulmonary veins drain normally to the left atrium. Calcium Score: 0 Agatston units Coronary Arteries: Right dominant with no anomalies LM: No plaque or stenosis. LAD system:  No plaque or stenosis. Circumflex system: No plaque or stenosis. RCA system:  No plaque or stenosis. IMPRESSION: 1. Coronary calcium score 0 Agatston units, suggesting low risk for future cardiac events. 2.  No significant CAD noted. [employer] promotion account executive Electronically Signed: By: Marca Ancona M.D. On: 10/01/2022 08:06   ECHOCARDIOGRAM COMPLETE  Result Date: 09/30/2022    ECHOCARDIOGRAM REPORT   Patient Name:   Anthony Boyd Date of Exam: 09/30/2022 Medical Rec #:  [id number]        Height:       67.0 in Accession #:     [id number]       Weight:       228.0 lb Date of Birth:  11-06-1965        BSA:          2.138 m Patient Age:    56 years         BP:           147/94 mmHg Patient  Gender: M                HR:           68 bpm. Exam Location:  Inpatient Procedure: 2D Echo, Color Doppler and Cardiac Doppler Indications:    R07.9* Chest pain, unspecified  History:        Patient has no prior history of Echocardiogram examinations.                 Risk Factors:Hypertension.  Sonographer:    Mike Gip Referring Phys: [phone] CARLOS MADERA IMPRESSIONS  1. Left ventricular ejection fraction, by estimation, is 60 to 65%. The left ventricle has normal function. The left ventricle has no regional wall motion abnormalities. There is mild concentric left ventricular hypertrophy. Left ventricular diastolic parameters are consistent with Grade I diastolic dysfunction (impaired relaxation).  2. Right ventricular systolic function is normal. The right ventricular size is mildly enlarged.  3. Left atrial size was mildly dilated.  4. The mitral valve is normal in structure. Mild mitral valve regurgitation. No evidence of mitral stenosis.  5. The aortic valve is tricuspid. Aortic valve regurgitation is not visualized. No aortic stenosis is present.  6. The inferior vena cava is dilated in size with >50% respiratory variability, suggesting right atrial pressure of 8 mmHg. FINDINGS  Left Ventricle: Left ventricular ejection fraction, by estimation, is 60 to 65%. The left ventricle has normal function. The left ventricle has no regional wall motion abnormalities. The left ventricular internal cavity size was normal in size. There is  mild concentric left ventricular hypertrophy. Left ventricular diastolic parameters are consistent with Grade I diastolic dysfunction (impaired relaxation). Normal left ventricular filling pressure. Right Ventricle: The right ventricular size is mildly enlarged. No increase in right ventricular wall thickness. Right  ventricular systolic function is normal. Left Atrium: Left atrial size was mildly dilated. Right Atrium: Right atrial size was normal in size. Pericardium: There is no evidence of pericardial effusion. Mitral Valve: The mitral valve is normal in structure. Mild mitral valve regurgitation, with centrally-directed jet. No evidence of mitral valve stenosis. Tricuspid Valve: The tricuspid valve is normal in structure. Tricuspid valve regurgitation is not demonstrated. Aortic Valve: The aortic valve is tricuspid. Aortic valve regurgitation is not visualized. No aortic stenosis is present. Pulmonic Valve: The pulmonic valve was grossly normal. Pulmonic valve regurgitation is not visualized. Aorta: The aortic root and ascending aorta are structurally normal, with no evidence of dilitation. Venous: The inferior vena cava is dilated in size with greater than 50% respiratory variability, suggesting right atrial pressure of 8 mmHg. IAS/Shunts: No atrial level shunt detected by color flow Doppler.  LEFT VENTRICLE PLAX 2D LVIDd:         4.20 cm      Diastology LVIDs:         2.90 cm      LV e' medial:    5.66 cm/s LV PW:         1.20 cm      LV E/e' medial:  9.6 LV IVS:        1.20 cm      LV e' lateral:   5.77 cm/s LVOT diam:     2.20 cm      LV E/e' lateral: 9.4 LV SV:         82 LV SV Index:   38 LVOT Area:     3.80 cm  LV Volumes (MOD) LV vol d, MOD A2C: 113.0 ml LV vol  d, MOD A4C: 92.6 ml LV vol s, MOD A2C: 44.3 ml LV vol s, MOD A4C: 32.3 ml LV SV MOD A2C:     68.7 ml LV SV MOD A4C:     92.6 ml LV SV MOD BP:      67.5 ml RIGHT VENTRICLE             IVC RV Basal diam:  4.50 cm     IVC diam: 2.10 cm RV S prime:     11.70 cm/s TAPSE (M-mode): 1.7 cm LEFT ATRIUM             Index        RIGHT ATRIUM           Index LA diam:        3.70 cm 1.73 cm/m   RA Area:     19.90 cm LA Vol (A2C):   66.9 ml 31.29 ml/m  RA Volume:   60.70 ml  28.39 ml/m LA Vol (A4C):   62.2 ml 29.09 ml/m LA Biplane Vol: 64.3 ml 30.07 ml/m  AORTIC  VALVE LVOT Vmax:   94.80 cm/s LVOT Vmean:  68.800 cm/s LVOT VTI:    0.215 m  AORTA Ao Root diam: 3.50 cm Ao Asc diam:  3.30 cm MITRAL VALVE MV Area (PHT): 2.62 cm    SHUNTS MV Decel Time: 290 msec    Systemic VTI:  0.22 m MV E velocity: 54.40 cm/s  Systemic Diam: 2.20 cm MV A velocity: 89.50 cm/s MV E/A ratio:  0.61 Mihai Croitoru MD Electronically signed by Thurmon Fair MD Signature Date/Time: 09/30/2022/4:01:20 PM    Final    CT Angio Chest/Abd/Pel for Dissection W and/or Wo Contrast  Result Date: 09/29/2022 CLINICAL DATA:  Chest pain. EXAM: CT ANGIOGRAPHY CHEST, ABDOMEN AND PELVIS TECHNIQUE: Non-contrast CT of the chest was initially obtained. Multidetector CT imaging through the chest, abdomen and pelvis was performed using the standard protocol during bolus administration of intravenous contrast. Multiplanar reconstructed images and MIPs were obtained and reviewed to evaluate the vascular anatomy. RADIATION DOSE REDUCTION: This exam was performed according to the departmental dose-optimization program which includes automated exposure control, adjustment of the mA and/or kV according to patient size and/or use of iterative reconstruction technique. CONTRAST:  OMNIPAQUE IOHEXOL 350 MG/ML SOLN COMPARISON:  May 17, 2009. FINDINGS: CTA CHEST FINDINGS Cardiovascular: Preferential opacification of the thoracic aorta. No evidence of thoracic aortic aneurysm or dissection. Normal heart size. No pericardial effusion. Mediastinum/Nodes: No enlarged mediastinal, hilar, or axillary lymph nodes. Thyroid gland, trachea, and esophagus demonstrate no significant findings. Lungs/Pleura: No pneumothorax is noted. Minimal left pleural effusion is noted. Mild bibasilar subsegmental atelectasis is noted. Musculoskeletal: No chest wall abnormality. No acute or significant osseous findings. Review of the MIP images confirms the above findings. CTA ABDOMEN AND PELVIS FINDINGS VASCULAR Aorta: Normal caliber aorta  without aneurysm, dissection, vasculitis or significant stenosis. Celiac: Patent without evidence of aneurysm, dissection, vasculitis or significant stenosis. SMA: Patent without evidence of aneurysm, dissection, vasculitis or significant stenosis. Renals: Bilateral renal arteries are patent without evidence of aneurysm, dissection, vasculitis, fibromuscular dysplasia or significant stenosis. IMA: Patent without evidence of aneurysm, dissection, vasculitis or significant stenosis. Inflow: Patent without evidence of aneurysm, dissection, vasculitis or significant stenosis. Veins: No obvious venous abnormality within the limitations of this arterial phase study. Review of the MIP images confirms the above findings. NON-VASCULAR Hepatobiliary: No focal liver abnormality is seen. No gallstones, gallbladder wall thickening, or biliary dilatation. Pancreas: Unremarkable. No  pancreatic ductal dilatation or surrounding inflammatory changes. Spleen: Normal in size without focal abnormality. Adrenals/Urinary Tract: Adrenal glands are unremarkable. Kidneys are normal, without renal calculi, focal lesion, or hydronephrosis. Bladder is unremarkable. Stomach/Bowel: Stomach is within normal limits. Appendix appears normal. No evidence of bowel wall thickening, distention, or inflammatory changes. Lymphatic: No significant adenopathy is noted. Reproductive: Mild prostatic enlargement. Other: Small fat containing right inguinal hernia. No ascites is noted. Musculoskeletal: No acute or significant osseous findings. Review of the MIP images confirms the above findings. IMPRESSION: No evidence of thoracic or abdominal aortic dissection or aneurysm. Minimal left pleural effusion is noted. Mild bibasilar subsegmental atelectasis is noted. Mild prostatic enlargement. Small fat containing right inguinal hernia. Electronically Signed   By: Lupita Raider M.D.   On: 09/29/2022 16:39   DG Chest Portable 1 View  Result Date:  09/29/2022 CLINICAL DATA:  Left-sided substernal chest pain EXAM: PORTABLE CHEST 1 VIEW COMPARISON:  05/08/2009 FINDINGS: Heart size upper limits of normal. Mild tortuous aorta. Increased markings at both lung bases that could be atelectasis or mild bibasilar pneumonia. Upper lungs are clear. No visible effusion. IMPRESSION: Increased markings at both lung bases that could be atelectasis or mild bibasilar pneumonia. Electronically Signed   By: Paulina Fusi M.D.   On: 09/29/2022 13:23    Cardiac Studies   Echocardiogram 09/30/2022: Impressions: 1. Left ventricular ejection fraction, by estimation, is 60 to 65%. The  left ventricle has normal function. The left ventricle has no regional  wall motion abnormalities. There is mild concentric left ventricular  hypertrophy. Left ventricular diastolic  parameters are consistent with Grade I diastolic dysfunction (impaired  relaxation).   2. Right ventricular systolic function is normal. The right ventricular  size is mildly enlarged.   3. Left atrial size was mildly dilated.   4. The mitral valve is normal in structure. Mild mitral valve  regurgitation. No evidence of mitral stenosis.   5. The aortic valve is tricuspid. Aortic valve regurgitation is not  visualized. No aortic stenosis is present.   6. The inferior vena cava is dilated in size with >50% respiratory  variability, suggesting right atrial pressure of 8 mmHg.  _______________  Coronary CTA 09/30/2022: Impressions: 1. Coronary calcium score 0 Agatston units, suggesting low risk for future cardiac events. 2.  No significant CAD noted.   Patient Profile     57 y.o. male with a history of hypertension, hyeprlipdiemia, pre-diabetes, GERD, rheumatoid arthritis, and obesity who was admitted on 09/29/2022 atypical chest pain and dyspnea on exertion. Cardiology consulted for further evaluation.  Assessment & Plan    Atypical Chest Pain Exertional Dyspnea Elevated Troponin  Patient  presented with atypical chest pain (made worse with deep inspiration and reproducible on exam) x3 days as well as increased shortness of breath with exertion over the past few weeks. High-sensitivity troponin minimally elevated and peaked at 45. EKG showed no acute ischemic changes. Echo showed LVEF of 60-65% with normal wall motion, mild LVH, and  grade 1 diastolic dysfunction as well as mild MR. Coronary CTA showed coronary calcium score of 0 with no evidence of CAD. - No recurrent chest pain.  - Chest pain non-cardiac in nature. He reports pain is sometimes worse after eating salads. Recommended following up with GI.  - No additional cardiac work-up necessary.  Hypertension BP well controlled since last night.  - Continue current medications: Amlodipine  daily and Irbesartan  daily.  Hyperlipidemia Lipid panel this admission: Total Cholesterol 238, Triglycerides 81,  HDL 76, LDL 146. 10 year ASCVD risk is 10.5% which is considered intermediate risk. LFTs mildly elevated on admission but patent reported said this was due to methotrexate.  - Can resume home Crestor and monitor LFTs as an outpatient.   Mild Mitral Regurgitation Noted on Echo this admission.  - PCP can continue to follow as an outpatient.   Otherwise, per primary team: - Prediabetes - GERD - Rheumatoid arthritis - Elevated LFTs  Disposition: Patient is stable for discharge from a cardiac standpoint. Given coronary CTA showed no evidence CAD, I don't think he needs Cardiology follow-up. Can follow-up with PCP. Would recommend a repeat Echo in 3-5 years for routine monitoring of mitral regurgitation but PCP can do this. Dr. Cristal Deer to see later for any addition recommendations.    For questions or updates, please contact [facility] Please consult [email or web address] for contact info under        Signed, Corrin Parker, PA-C  10/01/2022, 12:02 PM

## 2022-10-01 NOTE — Progress Notes (Signed)
Patient discharged: Home with family  Via: Wheelchair   Discharge paperwork given: to patient and family  Reviewed with teach back  IV and telemetry disconnected  Belongings given to patient    

## 2022-10-03 DIAGNOSIS — R7989 Other specified abnormal findings of blood chemistry: Secondary | ICD-10-CM | POA: Diagnosis not present

## 2022-10-03 DIAGNOSIS — M0579 Rheumatoid arthritis with rheumatoid factor of multiple sites without organ or systems involvement: Secondary | ICD-10-CM | POA: Diagnosis not present

## 2022-10-03 DIAGNOSIS — I10 Essential (primary) hypertension: Secondary | ICD-10-CM | POA: Diagnosis not present

## 2022-10-15 DIAGNOSIS — Z23 Encounter for immunization: Secondary | ICD-10-CM | POA: Diagnosis not present

## 2022-10-24 DIAGNOSIS — M0579 Rheumatoid arthritis with rheumatoid factor of multiple sites without organ or systems involvement: Secondary | ICD-10-CM | POA: Diagnosis not present

## 2022-10-24 DIAGNOSIS — Z79899 Other long term (current) drug therapy: Secondary | ICD-10-CM | POA: Diagnosis not present

## 2022-10-24 DIAGNOSIS — M25561 Pain in right knee: Secondary | ICD-10-CM | POA: Diagnosis not present

## 2022-10-24 DIAGNOSIS — M79643 Pain in unspecified hand: Secondary | ICD-10-CM | POA: Diagnosis not present

## 2022-10-29 DIAGNOSIS — M0579 Rheumatoid arthritis with rheumatoid factor of multiple sites without organ or systems involvement: Secondary | ICD-10-CM | POA: Diagnosis not present

## 2022-11-21 DIAGNOSIS — M0579 Rheumatoid arthritis with rheumatoid factor of multiple sites without organ or systems involvement: Secondary | ICD-10-CM | POA: Diagnosis not present

## 2022-11-21 DIAGNOSIS — Z79899 Other long term (current) drug therapy: Secondary | ICD-10-CM | POA: Diagnosis not present

## 2022-11-21 DIAGNOSIS — M79643 Pain in unspecified hand: Secondary | ICD-10-CM | POA: Diagnosis not present

## 2022-11-21 DIAGNOSIS — M25561 Pain in right knee: Secondary | ICD-10-CM | POA: Diagnosis not present

## 2022-11-21 DIAGNOSIS — R059 Cough, unspecified: Secondary | ICD-10-CM | POA: Diagnosis not present

## 2022-11-27 ENCOUNTER — Emergency Department (HOSPITAL_COMMUNITY): Payer: BC Managed Care – PPO

## 2022-11-27 ENCOUNTER — Emergency Department (HOSPITAL_COMMUNITY)
Admission: EM | Admit: 2022-11-27 | Discharge: 2022-11-27 | Disposition: A | Discharge status: Home / Self Care | Payer: BC Managed Care – PPO | Attending: Emergency Medicine | Admitting: Emergency Medicine

## 2022-11-27 ENCOUNTER — Encounter (HOSPITAL_COMMUNITY): Payer: Self-pay | Admitting: Emergency Medicine

## 2022-11-27 DIAGNOSIS — J9811 Atelectasis: Secondary | ICD-10-CM | POA: Diagnosis not present

## 2022-11-27 DIAGNOSIS — M0579 Rheumatoid arthritis with rheumatoid factor of multiple sites without organ or systems involvement: Secondary | ICD-10-CM | POA: Diagnosis not present

## 2022-11-27 DIAGNOSIS — R1012 Left upper quadrant pain: Secondary | ICD-10-CM | POA: Diagnosis not present

## 2022-11-27 DIAGNOSIS — D75839 Thrombocytosis, unspecified: Secondary | ICD-10-CM | POA: Diagnosis not present

## 2022-11-27 DIAGNOSIS — E871 Hypo-osmolality and hyponatremia: Secondary | ICD-10-CM | POA: Insufficient documentation

## 2022-11-27 DIAGNOSIS — R053 Chronic cough: Secondary | ICD-10-CM | POA: Diagnosis not present

## 2022-11-27 DIAGNOSIS — R7303 Prediabetes: Secondary | ICD-10-CM | POA: Diagnosis not present

## 2022-11-27 DIAGNOSIS — J90 Pleural effusion, not elsewhere classified: Secondary | ICD-10-CM | POA: Insufficient documentation

## 2022-11-27 DIAGNOSIS — R109 Unspecified abdominal pain: Secondary | ICD-10-CM

## 2022-11-27 DIAGNOSIS — R1032 Left lower quadrant pain: Secondary | ICD-10-CM | POA: Insufficient documentation

## 2022-11-27 DIAGNOSIS — J479 Bronchiectasis, uncomplicated: Secondary | ICD-10-CM | POA: Diagnosis not present

## 2022-11-27 DIAGNOSIS — I10 Essential (primary) hypertension: Secondary | ICD-10-CM | POA: Insufficient documentation

## 2022-11-27 LAB — COMPREHENSIVE METABOLIC PANEL
ALT: 33 U/L (ref 0–44)
AST: 58 U/L — ABNORMAL HIGH (ref 15–41)
Albumin: 2.8 g/dL — ABNORMAL LOW (ref 3.5–5.0)
Alkaline Phosphatase: 64 U/L (ref 38–126)
Anion gap: 8 (ref 5–15)
BUN: 16 mg/dL (ref 6–20)
CO2: 23 mmol/L (ref 22–32)
Calcium: 8.6 mg/dL — ABNORMAL LOW (ref 8.9–10.3)
Chloride: 102 mmol/L (ref 98–111)
Creatinine, Ser: 0.77 mg/dL (ref 0.61–1.24)
GFR, Estimated: >60 mL/min (ref >60)
Glucose, Bld: 98 mg/dL (ref 70–99)
Potassium: 3.9 mmol/L (ref 3.5–5.1)
Sodium: 133 mmol/L — ABNORMAL LOW (ref 135–145)
Total Bilirubin: 0.9 mg/dL (ref 0.3–1.2)
Total Protein: 7.6 g/dL (ref 6.5–8.1)

## 2022-11-27 LAB — URINALYSIS, ROUTINE W REFLEX MICROSCOPIC
Bilirubin Urine: NEGATIVE
Glucose, UA: NEGATIVE mg/dL
Hgb urine dipstick: NEGATIVE
Ketones, ur: NEGATIVE mg/dL
Leukocytes,Ua: NEGATIVE
Nitrite: NEGATIVE
Protein, ur: 100 mg/dL — AB
Specific Gravity, Urine: 1.029 (ref 1.005–1.030)
pH: 5 (ref 5.0–8.0)

## 2022-11-27 LAB — CBC
HCT: 43.4 % (ref 39.0–52.0)
Hemoglobin: 13.1 g/dL (ref 13.0–17.0)
MCH: 24.7 pg — ABNORMAL LOW (ref 26.0–34.0)
MCHC: 30.2 g/dL (ref 30.0–36.0)
MCV: 81.9 fL (ref 80.0–100.0)
Platelets: 448 10*3/uL — ABNORMAL HIGH (ref 150–400)
RBC: 5.3 MIL/uL (ref 4.22–5.81)
RDW: 15.2 % (ref 11.5–15.5)
WBC: 9.8 10*3/uL (ref 4.0–10.5)
nRBC: 0 % (ref 0.0–0.2)

## 2022-11-27 LAB — LIPASE, BLOOD: Lipase: 36 U/L (ref 11–51)

## 2022-11-27 LAB — BRAIN NATRIURETIC PEPTIDE: B Natriuretic Peptide: 34.4 pg/mL (ref 0.0–100.0)

## 2022-11-27 MED ORDER — AMOXICILLIN-POT CLAVULANATE 875-125 MG PO TABS: 1.0000 | ORAL_TABLET | Freq: Two times a day (BID) | ORAL | 0 refills | Status: DC

## 2022-11-27 MED ORDER — FAMOTIDINE 20 MG PO TABS
20.0000 mg | ORAL_TABLET | Freq: Once | ORAL | Status: AC
  Administered 2022-11-27: 20 mg via ORAL
  Filled 2022-11-27: qty 1

## 2022-11-27 MED ORDER — ALUM & MAG HYDROXIDE-SIMETH 200-200-20 MG/5ML PO SUSP
15.0000 mL | Freq: Once | ORAL | Status: AC
  Administered 2022-11-27: 15 mL via ORAL
  Filled 2022-11-27: qty 30

## 2022-11-27 MED ORDER — SODIUM CHLORIDE 0.9 % IV BOLUS
1000.0000 mL | Freq: Once | INTRAVENOUS | Status: AC
  Administered 2022-11-27: 1000 mL via INTRAVENOUS

## 2022-11-27 MED ORDER — AMOXICILLIN-POT CLAVULANATE 875-125 MG PO TABS
1.0000 | ORAL_TABLET | Freq: Once | ORAL | Status: AC
  Administered 2022-11-27: 1 via ORAL
  Filled 2022-11-27: qty 1

## 2022-11-27 MED ORDER — IOHEXOL 300 MG/ML  SOLN
100.0000 mL | Freq: Once | INTRAMUSCULAR | Status: AC | PRN
  Administered 2022-11-27: 100 mL via INTRAVENOUS

## 2022-11-27 NOTE — ED Provider Notes (Signed)
EMERGENCY DEPARTMENT AT [facility] Provider Note   CSN: [id number] Arrival date & time: 11/27/22  1124     History  Chief Complaint  Patient presents with   Abdominal Pain    Anthony Boyd is a 57 y.o. male.   Abdominal Pain   57 year old male presents emergency department with complaints of left-sided abdominal pain.  Patient states his symptoms began 2-2 and half days ago.  Does not remember inciting event. States that symptoms are worsened whenever he leans down to pick up an object, moves a certain way, coughs.  He also states that when he was lying down eating chicken pot pie 1 to 2 days ago states that symptoms are worsened and somewhat relieved when he sat up to eat subsequently.  Was seen by primary care who sent to the emergency department for further assessment.  Pain located in the left upper and left lower abdomen without radiation.  Described as sharp in nature.  States has had a history of symptoms in the past related to reflux but has been out of his at home Protonix with inability to get refill.  Denies fever, chills, nausea, vomiting, urinary symptoms, change in bowel habits, chest pain, shortness of breath.  Does have history of abdominal surgery including umbilical hernia repair in 05/20/2022.  Past medical history significant for rheumatoid arthritis on Simponi Aria/leflunomide, hypertension, osteoporosis, obesity,  Home Medications Prior to Admission medications   Medication Sig Start Date End Date Taking? Authorizing Provider  amoxicillin-clavulanate (AUGMENTIN) 875-125 MG tablet Take 1 tablet by mouth every 12 (twelve) hours. 11/28/22  Yes Sherian Maroon A, PA  amLODipine (NORVASC) 10 MG tablet Take 10 mg by mouth daily.    [provider]  celecoxib (CELEBREX) 200 MG capsule Take 200 mg by mouth 2 (two) times daily. 05/29/17   [provider]  cycloSPORINE (RESTASIS) 0.05 % ophthalmic emulsion Place 1 drop into both  eyes 2 (two) times daily. 10/01/22   Azucena Fallen, MD  folic acid (FOLVITE) 1 MG tablet Take 1 mg by mouth daily. 04/02/17   [provider]  golimumab (SIMPONI ARIA) 50 MG/4ML SOLN injection Inject 50 mg into the vein every 8 (eight) weeks.    [provider]  irbesartan (AVAPRO) 300 MG tablet Take 1 tablet (300 mg total) by mouth daily. 10/02/22   Azucena Fallen, MD  leflunomide (ARAVA) 20 MG tablet Take 20 mg by mouth daily.    [provider]  pantoprazole (PROTONIX) 40 MG tablet Take 1 tablet (40 mg total) by mouth 2 (two) times daily. 10/01/22   Azucena Fallen, MD  rosuvastatin (CRESTOR) 20 MG tablet Take 20 mg by mouth at bedtime.    [provider]  tiZANidine (ZANAFLEX) 2 MG tablet Take 1 tablet (2 mg total) by mouth 3 (three) times daily as needed. 10/01/22   Azucena Fallen, MD  TUMS 500 MG chewable tablet Chew 1-3 tablets by mouth daily as needed for indigestion or heartburn.    [provider]  UNKNOWN TO PATIENT Place 1 drop into both eyes See admin instructions. Unnamed eye drops- Place 1 drop into both eyes once a day    [provider]      Allergies    Patient has no known allergies.    Review of Systems   Review of Systems  Gastrointestinal:  Positive for abdominal pain.  All other systems reviewed and are negative.   Physical Exam Updated Vital  Signs BP (!) 142/88   Pulse 77   Temp 98.6 F (37 C) (Oral)   Resp 18   Ht 5\' 7"  (1.702 m)   Wt 97.1 kg   SpO2 98%   BMI 33.52 kg/m  Physical Exam Vitals and nursing note reviewed.  Constitutional:      General: He is not in acute distress.    Appearance: He is well-developed.  HENT:     Head: Normocephalic and atraumatic.  Eyes:     Conjunctiva/sclera: Conjunctivae normal.  Cardiovascular:     Rate and Rhythm: Normal rate and regular rhythm.     Heart sounds: No murmur heard. Pulmonary:     Effort: Pulmonary effort is normal. No  respiratory distress.     Comments: Possible Rales rotated bilateral lung bases. Abdominal:     Palpations: Abdomen is soft.     Tenderness: There is abdominal tenderness in the left upper quadrant. There is no right CVA tenderness, left CVA tenderness or guarding. Negative signs include Murphy's sign, Rovsing's sign and McBurney's sign.     Comments: Carnett's sign positive  Musculoskeletal:        General: No swelling.     Cervical back: Neck supple.  Skin:    General: Skin is warm and dry.     Capillary Refill: Capillary refill takes less than 2 seconds.  Neurological:     Mental Status: He is alert.  Psychiatric:        Mood and Affect: Mood normal.     ED Results / Procedures / Treatments   Labs (all labs ordered are listed, but only abnormal results are displayed) Labs Reviewed  COMPREHENSIVE METABOLIC PANEL - Abnormal; Notable for the following components:      Result Value   Sodium 133 (*)    Calcium 8.6 (*)    Albumin 2.8 (*)    AST 58 (*)    All other components within normal limits  CBC - Abnormal; Notable for the following components:   MCH 24.7 (*)    Platelets 448 (*)    All other components within normal limits  URINALYSIS, ROUTINE W REFLEX MICROSCOPIC - Abnormal; Notable for the following components:   Color, Urine AMBER (*)    Protein, ur 100 (*)    Bacteria, UA RARE (*)    All other components within normal limits  LIPASE, BLOOD  BRAIN NATRIURETIC PEPTIDE    EKG EKG Interpretation  Date/Time:  Thursday November 27 2022 15:49:16 EDT Ventricular Rate:  87 PR Interval:  137 QRS Duration: 85 QT Interval:  345 QTC Calculation: 415 R Axis:   54 Text Interpretation: Sinus rhythm Confirmed by Alvester Chou [phone]) on 11/27/2022 4:36:45 PM  Radiology CT Chest Wo Contrast  Result Date: 11/27/2022 CLINICAL DATA:  Chronic cough. EXAM: CT CHEST WITHOUT CONTRAST TECHNIQUE: Multidetector CT imaging of the chest was performed following the standard protocol  without IV contrast. RADIATION DOSE REDUCTION: This exam was performed according to the departmental dose-optimization program which includes automated exposure control, adjustment of the mA and/or kV according to patient size and/or use of iterative reconstruction technique. COMPARISON:  CT chest dated September 29, 2022. FINDINGS: Cardiovascular: No significant vascular findings. Normal heart size. No pericardial effusion. Mediastinum/Nodes: Small reactive mediastinal lymph nodes. No pathologically enlarged mediastinal or axillary lymph nodes. Thyroid gland, trachea, and esophagus demonstrate no significant findings. Lungs/Pleura: Small left-greater-than-right loculated pleural effusions, increased in size since April. Left-greater-than-right bibasilar atelectasis. Scattered pleuroparenchymal scarring throughout both lungs. Focal area of  bronchiectasis in the anterior right upper lobe. No consolidation or pneumothorax. Upper Abdomen: No acute abnormality. Musculoskeletal: No acute or significant osseous findings. IMPRESSION: 1. Small left-greater-than-right loculated pleural effusions, increased in size since April. Electronically Signed   By: Obie Dredge M.D.   On: 11/27/2022 16:28   CT ABDOMEN PELVIS W CONTRAST  Result Date: 11/27/2022 CLINICAL DATA:  Abdominal pain. EXAM: CT ABDOMEN AND PELVIS WITH CONTRAST TECHNIQUE: Multidetector CT imaging of the abdomen and pelvis was performed using the standard protocol following bolus administration of intravenous contrast. RADIATION DOSE REDUCTION: This exam was performed according to the departmental dose-optimization program which includes automated exposure control, adjustment of the mA and/or kV according to patient size and/or use of iterative reconstruction technique. CONTRAST:  OMNIPAQUE IOHEXOL 300 MG/ML  SOLN COMPARISON:  CT chest, abdomen, and pelvis dated September 29, 2022. FINDINGS: Lower chest: Increased loculated left pleural effusion with thin  pleural enhancement. New trace loculated right pleural effusion. Left-greater-than-right basilar subsegmental atelectasis. Hepatobiliary: No focal liver abnormality is seen. No gallstones, gallbladder wall thickening, or biliary dilatation. Pancreas: Unremarkable. No pancreatic ductal dilatation or surrounding inflammatory changes. Spleen: Normal in size without focal abnormality. Adrenals/Urinary Tract: Adrenal glands are unremarkable. Kidneys are normal, without renal calculi, focal lesion, or hydronephrosis. Bladder is for the degree of distention. Stomach/Bowel: Stomach is within normal limits. Appendix appears normal. No evidence of bowel wall thickening, distention, or inflammatory changes. Mild left-sided colonic diverticulosis. Vascular/Lymphatic: No significant vascular findings are present. No enlarged abdominal or pelvic lymph nodes. Reproductive: Borderline prostatomegaly. Other: Unchanged small fat containing bilateral inguinal hernias. More conspicuous mesenteric fat stranding around branches of the SMA in the left abdomen (series 2, image 39). No free fluid or pneumoperitoneum. Musculoskeletal: No acute or significant osseous findings. IMPRESSION: 1. 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 2. Increased loculated left pleural effusion. New trace loculated right pleural effusion. Electronically Signed   By: Obie Dredge M.D.   On: 11/27/2022 15:18    Procedures Procedures    Medications Ordered in ED Medications  famotidine (PEPCID) tablet 20 mg (20 mg Oral Given 11/27/22 1345)  alum & mag hydroxide-simeth (MAALOX/MYLANTA) 200-200-20 MG/5ML suspension 15 mL (15 mLs Oral Given 11/27/22 1345)  sodium chloride 0.9 % bolus 1,000 mL (0 mLs Intravenous Stopped 11/27/22 1521)  iohexol (OMNIPAQUE) 300 MG/ML solution 100 mL (100 mLs Intravenous Contrast Given 11/27/22 1436)  amoxicillin-clavulanate  (AUGMENTIN) 875-125 MG per tablet 1 tablet (1 tablet Oral Given 11/27/22 1651)    ED Course/ Medical Decision Making/ A&P Clinical Course as of 11/27/22 2107  Thu Nov 27, 2022  1537 Reevaluation of the patient after evidence of pleural effusion on CT imaging of the abdomen show the patient still with persistent cough that he states has not worsened since prior hospitalization.  Denies any shortness of breath or pain in his chest.  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  There is also small bilateral pleural effusions with a loculated type appearance.  He has had prior pleural effusions in the past.  Is not clear if this is acutely infectious.  His labs are otherwise unremarkable, pain is relatively controlled.  No evidence of sepsis.  No hypoxia.  I think is  reasonable to treat with antibiotics, Augmentin is cross coverage for both pulmonary infection as well as intra-abdominal infection -will offer pulmonology as outpatient follow-up for these persistent effusions and loculated appearance.  Return precautions were discussed.  Patient is comfortable going home. [MT]    Clinical Course User Index [CR] Peter Garter, PA [MT] Renaye Rakers Kermit Balo, MD                             Medical Decision Making Amount and/or Complexity of Data Reviewed Labs: ordered. Radiology: ordered.  Risk OTC drugs. Prescription drug management.   This patient presents to the ED for concern of abdominal pain, this involves an extensive number of treatment options, and is a complaint that carries with it a high risk of complications and morbidity.  The differential diagnosis includes gastritis, PUD, cholecystitis, CBD pathology, hepatitis, SBO/LBO, diverticulitis, appendicitis, mesenteric ischemia,  pyelonephritis, nephrolithiasis, cystitis   Co morbidities that complicate the patient evaluation  See HPI   Additional history obtained:  Additional history obtained from EMR External records from outside source obtained and reviewed including hospital records   Lab Tests:  I Ordered, and personally interpreted labs.  The pertinent results include: No leukocytosis noted.  No evidence of anemia.  Thrombocytosis of 448.  Lipase within normal limits.  Mild hyponatremia supplemented via IV fluids and hypocalcemia of 8.6 otherwise, electrolytes within normal limits.  No transaminitis.  No renal dysfunction.  UA significant for rare bacteria, 100 protein but otherwise unremarkable.   Imaging Studies ordered:  I ordered imaging studies including CT abdomen pelvis, chest x-ray I independently visualized and interpreted imaging which showed  CT abdomen pelvis: Mesenteric fat stranding around branches of SMA and left abdomen.  Increased loculated left pleural effusion.  New trace loculated right pleural effusion. CT chest: Small left greater than right loculated pleural effusion. I agree with the radiologist interpretation  Cardiac Monitoring: / EKG:  The patient was maintained on a cardiac monitor.  I personally viewed and interpreted the cardiac monitored which showed an underlying rhythm of: Sinus rhythm   Consultations Obtained:  N/a   Problem List / ED Course / Critical interventions / Medication management  Abdominal pain, pleural effusion, I ordered medication including 1 L normal saline, famotidine, Maalox   Reevaluation of the patient after these medicines showed that the patient improved I have reviewed the patients home medicines and have made adjustments as needed   Social Determinants of Health:  Denies tobacco, illicit drug use  Test / Admission - Considered:  Left side abdominal pain, pleural effusion Vitals signs significant for mild hypertension with blood  pressure 142/88. Otherwise within normal range and stable throughout visit. Laboratory/imaging studies significant for: See above 57 year old male presents emergency department with complaints of left-sided abdominal pain.  Regarding abdominal pain, CT imaging showed fat stranding of the mesentery around branches of SMA concerning for possible panniculitis.  Incidentally found on CT imaging of the abdomen was bilateral pleural effusions loculated and slightly increased from prior hospital admission 2 months ago.  Upon further review, patient without chest pain, shortness of breath, worsening cough.  Shared decision making conversation was had with patient regarding further consultations/proceed of admission given bilateral pleural effusion but patient inclined at this time.  Will treat with Augmentin given respiratory coverage for loculated pleural effusions as well as intra-abdominal coverage.  Will recommend close follow-up in the outpatient setting with pulmonology as well as GI for reassessment  of patient's symptoms.  Patient overall well-appearing, nonhypoxic, without leukocytosis, in no acute distress, without meeting SIRS criteria.  Further workup deemed unnecessary at this time.  Treatment plan discussed at length with patient and he acknowledged understanding was agreeable to said plan.  Patient overall well-appearing, afebrile in no acute distress. Worrisome signs and symptoms were discussed with the patient, and the patient acknowledged understanding to return to the ED if noticed. Patient was stable upon discharge.         Final Clinical Impression(s) / ED Diagnoses Final diagnoses:  Pleural effusion  Abdominal pain, unspecified abdominal location    Rx / DC Orders ED Discharge Orders          Ordered    amoxicillin-clavulanate (AUGMENTIN) 875-125 MG tablet  Every 12 hours        11/27/22 1647    Ambulatory referral to Pulmonology        11/27/22 1647               Peter Garter, [place] 11/27/22 2107    Terald Sleeper, MD 11/28/22 [phone]

## 2022-11-27 NOTE — Discharge Instructions (Signed)
Note the visit to the emergency department today was overall reassuring.  You did have evidence of something called panniculitis which is nonspecific inflammatory changes in your abdomen.  CT imaging also showed bilateral pleural effusion.  Regarding both, we will place on antibiotics in the form of Augmentin and recommend continued treatment of pain at home with Tylenol/Motrin.  Will also send in referral for pulmonology follow-up for further assessment regarding PE pleural effusions as well as attached your number to your discharge papers to call.  Please do not hesitate to return to emergency department if the worrisome signs and symptoms we discussed become apparent.

## 2022-11-27 NOTE — ED Triage Notes (Signed)
Pt reports left side/abdomen pain that started on right side and then moved to left 2 days ago and worsened. PCP sent to rule out peritonitis and diverticulitis. Pt on immune suppressants for arthritis. No bowel changes, last bm this morning.

## 2022-11-29 DIAGNOSIS — I10 Essential (primary) hypertension: Secondary | ICD-10-CM | POA: Diagnosis not present

## 2022-11-29 DIAGNOSIS — R030 Elevated blood-pressure reading, without diagnosis of hypertension: Secondary | ICD-10-CM | POA: Diagnosis not present

## 2022-11-29 DIAGNOSIS — J90 Pleural effusion, not elsewhere classified: Secondary | ICD-10-CM | POA: Diagnosis not present

## 2022-11-29 DIAGNOSIS — Z6835 Body mass index (BMI) 35.0-35.9, adult: Secondary | ICD-10-CM | POA: Diagnosis not present

## 2022-11-29 DIAGNOSIS — M069 Rheumatoid arthritis, unspecified: Secondary | ICD-10-CM | POA: Diagnosis not present

## 2022-12-02 DIAGNOSIS — I509 Heart failure, unspecified: Secondary | ICD-10-CM | POA: Diagnosis not present

## 2022-12-02 DIAGNOSIS — R942 Abnormal results of pulmonary function studies: Secondary | ICD-10-CM | POA: Diagnosis not present

## 2022-12-02 DIAGNOSIS — J90 Pleural effusion, not elsewhere classified: Secondary | ICD-10-CM | POA: Diagnosis not present

## 2022-12-02 DIAGNOSIS — M069 Rheumatoid arthritis, unspecified: Secondary | ICD-10-CM | POA: Diagnosis not present

## 2022-12-03 DIAGNOSIS — R0602 Shortness of breath: Secondary | ICD-10-CM | POA: Diagnosis not present

## 2022-12-03 DIAGNOSIS — J90 Pleural effusion, not elsewhere classified: Secondary | ICD-10-CM | POA: Diagnosis not present

## 2022-12-03 DIAGNOSIS — R0781 Pleurodynia: Secondary | ICD-10-CM | POA: Diagnosis not present

## 2022-12-09 ENCOUNTER — Encounter (HOSPITAL_BASED_OUTPATIENT_CLINIC_OR_DEPARTMENT_OTHER): Payer: Self-pay | Admitting: Pulmonary Disease

## 2022-12-09 ENCOUNTER — Ambulatory Visit (HOSPITAL_BASED_OUTPATIENT_CLINIC_OR_DEPARTMENT_OTHER): Payer: BC Managed Care – PPO | Admitting: Pulmonary Disease

## 2022-12-09 VITALS — BP 156/68 | HR 106 | Temp 98.6°F | Ht 67.0 in | Wt 207.2 lb

## 2022-12-09 DIAGNOSIS — J90 Pleural effusion, not elsewhere classified: Secondary | ICD-10-CM | POA: Diagnosis not present

## 2022-12-09 DIAGNOSIS — R0609 Other forms of dyspnea: Secondary | ICD-10-CM | POA: Diagnosis not present

## 2022-12-09 NOTE — Patient Instructions (Signed)
Will arrange for appointment with interventional radiology to assess for a left thoracentesis  Will schedule a pulmonary function test  Follow up in 4 weeks

## 2022-12-09 NOTE — Progress Notes (Signed)
[facility]  Chief Complaint  Patient presents with   Consult    Patient is here to talk about SOB.     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  Past Medical History:  Allergies, Rheumatoid arthritis, HTN, Osteoporosis  Constitutional:  BP (!) 156/68 (BP Location: Right Arm, Patient Position: Sitting, Cuff Size: Normal)   Pulse (!) 106   Temp 98.6 F (37 C) (Oral)   Ht 5\' 7"  (1.702 m)   Wt 207 lb 3.2 oz (94 kg)   SpO2 98%   BMI 32.45 kg/m   Brief Summary:  Anthony Boyd is a 57 y.o. male with dyspnea and pleuritic chest pain.      Subjective:   He was in hospital in April 2024 for chest pain.  Evaluation was unremarkable except for very small left pleural effusion.  Symptoms persisted.  He had repeat CT chest that showed Lt > Rt loculated effusions, regional bronchiectasis, and areas of scarring and atelectasis.  He has pain with movement in breathing around his lower left chest, but sometimes also around his lower right chest.  Has occasional cough with clear sputum.  No fever, dysphagia, reflux, wheezing, or hemoptysis.  He gets short of breath now with activity that he used to be able to complete easily.  No recent sick exposures or travel history.  No animal/bird exposures.  No history of exposure to tuberculosis.  He works as a Chartered certified accountant.  No history of smoking.   He has history of rheumatoid arthritis.  He was on methotrexate.  This was stopped about 9 months ago.  He was switched to simponi and is on 10 mg prednisone daily.  He feels his joint symptoms are controlled okay.    Physical Exam:   Appearance - well kempt   ENMT - no sinus tenderness, no oral exudate, no LAN, Mallampati 3 airway, no stridor  Respiratory -  decreased breath sounds at bases, no wheezing or rales  CV - s1s2 regular rate and rhythm, no murmurs  Ext - no clubbing, no edema  Skin - no rashes  Psych - normal mood and affect   Pulmonary testing:    Chest Imaging:  CT chest 09/29/22 >> minimal Lt pleural effusion CT chest 11/27/22 >> Lt > Rt loculated effusions, focal BTX RUL, scattered areas of scarring  Cardiac Tests:  Echo 09/30/22 >> EF 60 to 65%, grade 1 DD, mild MR  Social History:  He  reports that he has never smoked. He has never used smokeless tobacco. He reports that he does not drink alcohol and does not use drugs.  Family History:  His family history includes Hypertension in his father and mother.    Discussion:  He has developed dyspnea, cough and pleuritic chest pain since April 2024.  CT chest imaging now shows left greater than right loculated pleural effusions, regional bronchiectasis, areas of lung scarring and atelectasis.  Possible causes include previous infection, rheumatoid arthritis, or medication reaction.  I am less concerned about malignancy.  Assessment/Plan:   Loculated pleural effusions. - since the left effusion is larger, will have him assessed by interventional radiology for a left thoracentesis - will send fluid for glucose, protein, LDH, cell count, cytology, gram stain and bacterial culture, AFB and fungal cultures - will arrange  for pulmonary function testing - he might need thoracic surgery assessment if PFT shows significant restrictive lung disease from loculated effusion - advise he should remain out of work until cause of this is better defined  Rheumatoid arthritis. - followed by Dr. Haydee Salter with rheumatology  Time Spent Involved in Patient Care on Day of Examination:  52 minutes  Follow up:   Patient Instructions  Will arrange for appointment with interventional radiology to assess for a left thoracentesis  Will schedule a pulmonary function test  Follow up in 4  weeks  Medication List:   Allergies as of 12/09/2022   No Known Allergies      Medication List        Accurate as of December 09, 2022  1:44 PM. If you have any questions, ask your nurse or doctor.          amLODipine 10 MG tablet Commonly known as: NORVASC Take 10 mg by mouth daily.   amoxicillin-clavulanate 875-125 MG tablet Commonly known as: AUGMENTIN Take 1 tablet by mouth every 12 (twelve) hours.   celecoxib 200 MG capsule Commonly known as: CELEBREX Take 200 mg by mouth 2 (two) times daily.   cycloSPORINE 0.05 % ophthalmic emulsion Commonly known as: Restasis Place 1 drop into both eyes 2 (two) times daily.   folic acid 1 MG tablet Commonly known as: FOLVITE Take 1 mg by mouth daily.   irbesartan 300 MG tablet Commonly known as: AVAPRO Take 1 tablet (300 mg total) by mouth daily.   leflunomide 20 MG tablet Commonly known as: ARAVA Take 20 mg by mouth daily.   pantoprazole 40 MG tablet Commonly known as: PROTONIX Take 1 tablet (40 mg total) by mouth 2 (two) times daily.   rosuvastatin 20 MG tablet Commonly known as: CRESTOR Take 20 mg by mouth at bedtime.   Simponi Aria 50 MG/4ML Soln injection Generic drug: golimumab Inject 50 mg into the vein every 8 (eight) weeks.   tiZANidine 2 MG tablet Commonly known as: ZANAFLEX Take 1 tablet (2 mg total) by mouth 3 (three) times daily as needed.   Tums 500 MG chewable tablet Generic drug: calcium carbonate Chew 1-3 tablets by mouth daily as needed for indigestion or heartburn.   UNKNOWN TO PATIENT Place 1 drop into both eyes See admin instructions. Unnamed eye drops- Place 1 drop into both eyes once a day        Signature:  Coralyn Helling, MD [facility] Pager - [phone] 12/09/2022, 1:44 PM

## 2022-12-12 ENCOUNTER — Other Ambulatory Visit: Payer: Self-pay | Admitting: Pulmonary Disease

## 2022-12-12 DIAGNOSIS — J90 Pleural effusion, not elsewhere classified: Secondary | ICD-10-CM

## 2022-12-15 ENCOUNTER — Other Ambulatory Visit: Payer: Self-pay | Admitting: Pulmonary Disease

## 2022-12-15 ENCOUNTER — Ambulatory Visit (HOSPITAL_COMMUNITY)
Admission: RE | Admit: 2022-12-15 | Discharge: 2022-12-15 | Disposition: A | Discharge status: Home / Self Care | Payer: BC Managed Care – PPO | Source: Ambulatory Visit | Attending: Pulmonary Disease | Admitting: Pulmonary Disease

## 2022-12-15 DIAGNOSIS — J90 Pleural effusion, not elsewhere classified: Secondary | ICD-10-CM

## 2022-12-15 DIAGNOSIS — R0602 Shortness of breath: Secondary | ICD-10-CM | POA: Diagnosis not present

## 2022-12-15 MED ORDER — LIDOCAINE HCL 1 % IJ SOLN
INTRAMUSCULAR | Status: AC
  Filled 2022-12-15: qty 20

## 2022-12-15 NOTE — Progress Notes (Signed)
Patient presented to IR for thoracentesis evaluation. Limited ultrasound examination of left and right lungs revealed only scant pleural fluid. Fluid volume insufficient to safely perform this procedure. Image findings discussed with the patient. No procedure performed.   Alwyn Ren, [place] [phone] 12/15/2022, 10:13 AM

## 2022-12-16 ENCOUNTER — Encounter: Payer: Self-pay | Admitting: Pulmonary Disease

## 2022-12-16 ENCOUNTER — Telehealth: Payer: Self-pay | Admitting: Pulmonary Disease

## 2022-12-16 ENCOUNTER — Telehealth (HOSPITAL_BASED_OUTPATIENT_CLINIC_OR_DEPARTMENT_OTHER): Payer: Self-pay | Admitting: Pulmonary Disease

## 2022-12-16 DIAGNOSIS — J90 Pleural effusion, not elsewhere classified: Secondary | ICD-10-CM

## 2022-12-16 NOTE — Telephone Encounter (Signed)
Pt is out of work and wants to know when can he go back

## 2022-12-16 NOTE — Telephone Encounter (Signed)
I signed a letter for him so he can go back to work.  It is in the [employer] office.  Please have this faxed to [facility] office and call him to pick up letter.

## 2022-12-16 NOTE — Telephone Encounter (Signed)
Had u/s by IR - not enough fluid.  D/w pt.  Will arrange for repeat CT chest without contrast.  If he still has significant effusion that isn't amenable to IR thoracentesis, then will need referral to thoracic surgery.

## 2022-12-16 NOTE — Telephone Encounter (Signed)
Patient states he went for his thoracentesis, but he was told there was not enough fluid on the lungs for the procedure to be completed. Patient would like to know next steps. Please advise and call patient back.

## 2022-12-18 ENCOUNTER — Telehealth (HOSPITAL_BASED_OUTPATIENT_CLINIC_OR_DEPARTMENT_OTHER): Payer: Self-pay | Admitting: Pulmonary Disease

## 2022-12-18 NOTE — Telephone Encounter (Signed)
VS, please advise.  

## 2022-12-18 NOTE — Telephone Encounter (Signed)
Patient wants to know if having CT scan on 7/15 is going to affect his blood work and infusion that is scheduled for 7/16. Patient also has a few other questions and would like to speak with someone. Please advise and call patient back.

## 2022-12-19 ENCOUNTER — Telehealth: Payer: Self-pay | Admitting: Pulmonary Disease

## 2022-12-19 DIAGNOSIS — E782 Mixed hyperlipidemia: Secondary | ICD-10-CM | POA: Diagnosis not present

## 2022-12-19 DIAGNOSIS — I10 Essential (primary) hypertension: Secondary | ICD-10-CM | POA: Diagnosis not present

## 2022-12-19 DIAGNOSIS — R7303 Prediabetes: Secondary | ICD-10-CM | POA: Diagnosis not present

## 2022-12-19 DIAGNOSIS — M0579 Rheumatoid arthritis with rheumatoid factor of multiple sites without organ or systems involvement: Secondary | ICD-10-CM | POA: Diagnosis not present

## 2022-12-19 DIAGNOSIS — R7989 Other specified abnormal findings of blood chemistry: Secondary | ICD-10-CM | POA: Diagnosis not present

## 2022-12-19 NOTE — Telephone Encounter (Signed)
The letter was signed and faxed to [facility] office  Closing encounter

## 2022-12-19 NOTE — Telephone Encounter (Signed)
I wrote a letter in Epic on 12/16/22 stating he can go back to work on 12/18/22.

## 2022-12-19 NOTE — Telephone Encounter (Signed)
Left message on pt's VM advising CT that has been ordered is without contrast.  Asked pt to cb with any other questions. Nothing further needed at this time.

## 2022-12-19 NOTE — Telephone Encounter (Signed)
Having a CT scan will not have any effect on his lab work or getting an infusion on 7/16.

## 2022-12-19 NOTE — Telephone Encounter (Signed)
Pt called in bc he has questions about his CT scan wants to know if it will be without radiation

## 2022-12-22 ENCOUNTER — Ambulatory Visit (HOSPITAL_BASED_OUTPATIENT_CLINIC_OR_DEPARTMENT_OTHER)
Admission: RE | Admit: 2022-12-22 | Discharge: 2022-12-22 | Disposition: A | Discharge status: Home / Self Care | Payer: BC Managed Care – PPO | Source: Ambulatory Visit | Attending: Pulmonary Disease | Admitting: Pulmonary Disease

## 2022-12-22 DIAGNOSIS — J90 Pleural effusion, not elsewhere classified: Secondary | ICD-10-CM | POA: Insufficient documentation

## 2022-12-22 DIAGNOSIS — R918 Other nonspecific abnormal finding of lung field: Secondary | ICD-10-CM | POA: Diagnosis not present

## 2022-12-22 NOTE — Telephone Encounter (Signed)
Advised patient per Dr. Craige Cotta. CT scan wont affect labs or infusion. Patient verbalized understanding. NFN

## 2022-12-24 DIAGNOSIS — M0579 Rheumatoid arthritis with rheumatoid factor of multiple sites without organ or systems involvement: Secondary | ICD-10-CM | POA: Diagnosis not present

## 2022-12-26 ENCOUNTER — Telehealth: Payer: Self-pay | Admitting: Pulmonary Disease

## 2022-12-26 DIAGNOSIS — J90 Pleural effusion, not elsewhere classified: Secondary | ICD-10-CM | POA: Diagnosis not present

## 2022-12-26 DIAGNOSIS — R0781 Pleurodynia: Secondary | ICD-10-CM | POA: Diagnosis not present

## 2022-12-26 DIAGNOSIS — R0602 Shortness of breath: Secondary | ICD-10-CM | POA: Diagnosis not present

## 2022-12-26 MED ORDER — PREDNISONE 10 MG PO TABS: ORAL_TABLET | ORAL | 0 refills | Status: DC

## 2022-12-26 NOTE — Telephone Encounter (Signed)
CT chest 12/22/22 >> mild peribronchovascular and peripheral subpleural nodularity. Favor subtle areas of traction bronchiectasis. Basilar interstitial and septal thickening. Relatively thin rind of pleural fluid in the lower right hemithorax, indicative of chronicity. Small left pleural effusion, decreased from 11/27/2022.  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   D/w pt.  Will have him use prednisone 30 mg daily for 7 days, then 20 mg daily until his ROV on 01/13/23.  He got simponi infusion earlier this week, and will follow up with Dr. Kathi Ludwig to discuss therapy options for his rheumatoid arthritis.

## 2023-01-05 ENCOUNTER — Telehealth: Payer: Self-pay | Admitting: Pulmonary Disease

## 2023-01-05 NOTE — Telephone Encounter (Signed)
Did you want patient to keep taking this long term  is this okay to refill, please advise

## 2023-01-06 NOTE — Telephone Encounter (Signed)
He should be taking 20 mg prednisone daily until his appointment on 01/13/23.

## 2023-01-08 ENCOUNTER — Telehealth: Payer: Self-pay | Admitting: Pulmonary Disease

## 2023-01-08 NOTE — Telephone Encounter (Signed)
Patient is calling again regarding his request for Prednisone.  He understands he has an upcoming appt. But he said he will run out of medication before 8/6.  Please call patient to discuss further.  CB# [phone]

## 2023-01-09 ENCOUNTER — Other Ambulatory Visit: Payer: Self-pay | Admitting: Pulmonary Disease

## 2023-01-09 MED ORDER — PREDNISONE 10 MG PO TABS: 20.0000 mg | ORAL_TABLET | Freq: Every day | ORAL | 0 refills | Status: DC

## 2023-01-09 NOTE — Telephone Encounter (Signed)
Left message informing patient he should be taking 20mg  predinsone daily until his appointment and rx sent to pharmacy.

## 2023-01-13 ENCOUNTER — Ambulatory Visit (HOSPITAL_BASED_OUTPATIENT_CLINIC_OR_DEPARTMENT_OTHER): Payer: BC Managed Care – PPO | Admitting: Pulmonary Disease

## 2023-01-13 ENCOUNTER — Encounter (HOSPITAL_BASED_OUTPATIENT_CLINIC_OR_DEPARTMENT_OTHER): Payer: Self-pay | Admitting: Pulmonary Disease

## 2023-01-13 VITALS — BP 128/84 | HR 91 | Resp 19 | Ht 67.0 in | Wt 211.0 lb

## 2023-01-13 DIAGNOSIS — J849 Interstitial pulmonary disease, unspecified: Secondary | ICD-10-CM | POA: Diagnosis not present

## 2023-01-13 NOTE — H&P (View-Only) (Signed)
 [facility], Critical Care, and Sleep Medicine  Chief Complaint  Patient presents with   Follow-up    Pt wasn't able to Pft to get test done     Past Surgical History:  He  has a past surgical history that includes Foot surgery (1998, 2001); Knee surgery (2003); Total knee arthroplasty (Left, 03/04/2019); Umbilical hernia repair (N/A, 04/29/2022); and Insertion of mesh (N/A, 04/29/2022).  Past Medical History:  Allergies, Rheumatoid arthritis, HTN, Osteoporosis  Constitutional:  BP 128/84   Pulse 91   Resp 19   Ht 5\' 7"  (1.702 m)   Wt 211 lb (95.7 kg)   SpO2 97%   BMI 33.05 kg/m   Brief Summary:  Anthony Boyd is a 57 y.o. male with dyspnea and pleuritic chest pain.  He has ILD with NSIP pattern and history of rheumatoid arthritis.      Subjective:   Seen by IR.  Not enough fluid to warrant thoracentesis.  Repeat CT chest showed pattern suggestive of NSIP and decreased Lt effusion.  He was started on prednisone 30 mg daily for one week, and then 20 mg daily.    He is still having trouble with his breathing and pain in his chest with breathing.  Has intermittent cough.  Felt some improvement with higher dose of prednisone, but worried about side effects.  He is uncertain about his work status.  His PCP has him out of work until mid-September.  Physical Exam:   Appearance - well kempt   ENMT - no sinus tenderness, no oral exudate, no LAN, Mallampati 3 airway, no stridor  Respiratory - decreased breath sounds at bases, no wheezing or rales  CV - s1s2 regular rate and rhythm, no murmurs  Ext - no clubbing, no edema  Skin - no rashes  Psych - normal mood and affect   Pulmonary testing:    Chest Imaging:  CT chest 09/29/22 >> minimal Lt pleural effusion CT chest 11/27/22 >> Lt > Rt loculated effusions, focal BTX RUL, scattered areas of scarring CT chest 12/22/22 >> mild peribronchovascular and peripheral subpleural nodularity. Favor subtle areas of  traction bronchiectasis. Basilar interstitial and septal thickening. Relatively thin rind of pleural fluid in the lower right hemithorax, indicative of chronicity. Small left pleural effusion, decreased from 11/27/2022.  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  Cardiac Tests:  Echo 09/30/22 >> EF 60 to 65%, grade 1 DD, mild MR  Social History:  He  reports that he has never smoked. He has never used smokeless tobacco. He reports that he does not drink alcohol and does not use drugs.  Family History:  His family history includes Hypertension in his father and mother.     Assessment/Plan:   Interstitial lung disease. - pattern suggestive of NSIP possibly related to rheumatoid arthritis - PFT pending - will need to arrange for bronchoscopy with BAL +/- transbronchial biopsy to rule out infection before deciding whether he needs additional immunosuppressive therapy (such as with mycophenolate mofetil) - discussed option of referral to ILD specialist >> he would like to see what happens with bronchoscopy and PFT first  Rheumatoid arthritis. - followed by Dr. Haydee Salter with rheumatology  Loculated pleural effusions. - not enough to warrant thoracentesis based on IR ultrasound imaging from 12/15/22 - decreased size on CT chest imaging from 12/22/22 - could be related to rheumatoid arthritis also  Time Spent Involved in Patient Care on Day of Examination:  48 minutes  Follow up:   Patient  Instructions  We are arranging for further testing to determine the cause of your interstitial lung disease.  The main concern is that his is related to rheumatoid arthritis, but other conditions can cause this also.  We will call to schedule your bronchoscopy.  Follow up in 6 to 8 weeks with Dr. Craige Cotta or a nurse practitioner.  Medication List:   Allergies as of 01/13/2023   No Known Allergies      Medication List        Accurate as of January 13, 2023  5:21 PM. If you have any  questions, ask your nurse or doctor.          STOP taking these medications    predniSONE 10 MG tablet Commonly known as: DELTASONE Stopped by: Coralyn Helling       TAKE these medications    amLODipine 10 MG tablet Commonly known as: NORVASC Take 10 mg by mouth daily.   celecoxib 200 MG capsule Commonly known as: CELEBREX Take 200 mg by mouth 2 (two) times daily.   cycloSPORINE 0.05 % ophthalmic emulsion Commonly known as: Restasis Place 1 drop into both eyes 2 (two) times daily.   folic acid 1 MG tablet Commonly known as: FOLVITE Take 1 mg by mouth daily.   irbesartan 300 MG tablet Commonly known as: AVAPRO Take 1 tablet (300 mg total) by mouth daily.   pantoprazole 40 MG tablet Commonly known as: PROTONIX Take 1 tablet (40 mg total) by mouth 2 (two) times daily.   rosuvastatin 20 MG tablet Commonly known as: CRESTOR Take 20 mg by mouth at bedtime.   Simponi Aria 50 MG/4ML Soln injection Generic drug: golimumab Inject 50 mg into the vein every 8 (eight) weeks.   tiZANidine 2 MG tablet Commonly known as: ZANAFLEX Take 1 tablet (2 mg total) by mouth 3 (three) times daily as needed.   Tums 500 MG chewable tablet Generic drug: calcium carbonate Chew 1-3 tablets by mouth daily as needed for indigestion or heartburn.   UNKNOWN TO PATIENT Place 1 drop into both eyes See admin instructions. Unnamed eye drops- Place 1 drop into both eyes once a day        Signature:  Coralyn Helling, MD [facility] Pulmonary/Critical Care Pager - [phone] 01/13/2023, 5:21 PM

## 2023-01-13 NOTE — Telephone Encounter (Signed)
On vacation and return to office on 01/13/23.  Only seeing message now.  He has appointment on 01/13/23.  Will discuss need to continue prednisone at his ROV.

## 2023-01-13 NOTE — Progress Notes (Signed)
[facility], Critical Care, and Sleep Medicine  Chief Complaint  Patient presents with   Follow-up    Pt wasn't able to Pft to get test done     Past Surgical History:  He  has a past surgical history that includes Foot surgery (1998, 2001); Knee surgery (2003); Total knee arthroplasty (Left, 03/04/2019); Umbilical hernia repair (N/A, 04/29/2022); and Insertion of mesh (N/A, 04/29/2022).  Past Medical History:  Allergies, Rheumatoid arthritis, HTN, Osteoporosis  Constitutional:  BP 128/84   Pulse 91   Resp 19   Ht 5\' 7"  (1.702 m)   Wt 211 lb (95.7 kg)   SpO2 97%   BMI 33.05 kg/m   Brief Summary:  Anthony Boyd is a 57 y.o. male with dyspnea and pleuritic chest pain.  He has ILD with NSIP pattern and history of rheumatoid arthritis.      Subjective:   Seen by IR.  Not enough fluid to warrant thoracentesis.  Repeat CT chest showed pattern suggestive of NSIP and decreased Lt effusion.  He was started on prednisone 30 mg daily for one week, and then 20 mg daily.    He is still having trouble with his breathing and pain in his chest with breathing.  Has intermittent cough.  Felt some improvement with higher dose of prednisone, but worried about side effects.  He is uncertain about his work status.  His PCP has him out of work until mid-September.  Physical Exam:   Appearance - well kempt   ENMT - no sinus tenderness, no oral exudate, no LAN, Mallampati 3 airway, no stridor  Respiratory - decreased breath sounds at bases, no wheezing or rales  CV - s1s2 regular rate and rhythm, no murmurs  Ext - no clubbing, no edema  Skin - no rashes  Psych - normal mood and affect   Pulmonary testing:    Chest Imaging:  CT chest 09/29/22 >> minimal Lt pleural effusion CT chest 11/27/22 >> Lt > Rt loculated effusions, focal BTX RUL, scattered areas of scarring CT chest 12/22/22 >> mild peribronchovascular and peripheral subpleural nodularity. Favor subtle areas of  traction bronchiectasis. Basilar interstitial and septal thickening. Relatively thin rind of pleural fluid in the lower right hemithorax, indicative of chronicity. Small left pleural effusion, decreased from 11/27/2022.  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  Cardiac Tests:  Echo 09/30/22 >> EF 60 to 65%, grade 1 DD, mild MR  Social History:  He  reports that he has never smoked. He has never used smokeless tobacco. He reports that he does not drink alcohol and does not use drugs.  Family History:  His family history includes Hypertension in his father and mother.     Assessment/Plan:   Interstitial lung disease. - pattern suggestive of NSIP possibly related to rheumatoid arthritis - PFT pending - will need to arrange for bronchoscopy with BAL +/- transbronchial biopsy to rule out infection before deciding whether he needs additional immunosuppressive therapy (such as with mycophenolate mofetil) - discussed option of referral to ILD specialist >> he would like to see what happens with bronchoscopy and PFT first  Rheumatoid arthritis. - followed by Dr. Haydee Salter with rheumatology  Loculated pleural effusions. - not enough to warrant thoracentesis based on IR ultrasound imaging from 12/15/22 - decreased size on CT chest imaging from 12/22/22 - could be related to rheumatoid arthritis also  Time Spent Involved in Patient Care on Day of Examination:  48 minutes  Follow up:   Patient  Instructions  We are arranging for further testing to determine the cause of your interstitial lung disease.  The main concern is that his is related to rheumatoid arthritis, but other conditions can cause this also.  We will call to schedule your bronchoscopy.  Follow up in 6 to 8 weeks with Dr. Craige Cotta or a nurse practitioner.  Medication List:   Allergies as of 01/13/2023   No Known Allergies      Medication List        Accurate as of January 13, 2023  5:21 PM. If you have any  questions, ask your nurse or doctor.          STOP taking these medications    predniSONE 10 MG tablet Commonly known as: DELTASONE Stopped by: Coralyn Helling       TAKE these medications    amLODipine 10 MG tablet Commonly known as: NORVASC Take 10 mg by mouth daily.   celecoxib 200 MG capsule Commonly known as: CELEBREX Take 200 mg by mouth 2 (two) times daily.   cycloSPORINE 0.05 % ophthalmic emulsion Commonly known as: Restasis Place 1 drop into both eyes 2 (two) times daily.   folic acid 1 MG tablet Commonly known as: FOLVITE Take 1 mg by mouth daily.   irbesartan 300 MG tablet Commonly known as: AVAPRO Take 1 tablet (300 mg total) by mouth daily.   pantoprazole 40 MG tablet Commonly known as: PROTONIX Take 1 tablet (40 mg total) by mouth 2 (two) times daily.   rosuvastatin 20 MG tablet Commonly known as: CRESTOR Take 20 mg by mouth at bedtime.   Simponi Aria 50 MG/4ML Soln injection Generic drug: golimumab Inject 50 mg into the vein every 8 (eight) weeks.   tiZANidine 2 MG tablet Commonly known as: ZANAFLEX Take 1 tablet (2 mg total) by mouth 3 (three) times daily as needed.   Tums 500 MG chewable tablet Generic drug: calcium carbonate Chew 1-3 tablets by mouth daily as needed for indigestion or heartburn.   UNKNOWN TO PATIENT Place 1 drop into both eyes See admin instructions. Unnamed eye drops- Place 1 drop into both eyes once a day        Signature:  Coralyn Helling, MD [facility] Pulmonary/Critical Care Pager - [phone] 01/13/2023, 5:21 PM

## 2023-01-13 NOTE — Patient Instructions (Signed)
We are arranging for further testing to determine the cause of your interstitial lung disease.  The main concern is that his is related to rheumatoid arthritis, but other conditions can cause this also.  We will call to schedule your bronchoscopy.  Follow up in 6 to 8 weeks with Dr. Craige Cotta or a nurse practitioner.

## 2023-01-14 ENCOUNTER — Institutional Professional Consult (permissible substitution): Payer: BC Managed Care – PPO | Admitting: Pulmonary Disease

## 2023-01-14 ENCOUNTER — Telehealth: Payer: Self-pay | Admitting: Pulmonary Disease

## 2023-01-14 ENCOUNTER — Encounter: Payer: Self-pay | Admitting: Emergency Medicine

## 2023-01-14 DIAGNOSIS — J849 Interstitial pulmonary disease, unspecified: Secondary | ICD-10-CM

## 2023-01-14 NOTE — Telephone Encounter (Signed)
Request placed to schedule bronchoscopy with fluoroscopy for BAL and transbronchial biopsies

## 2023-01-14 NOTE — Telephone Encounter (Signed)
57 yo male with rheumatoid arthritis, dyspnea, pleuritic chest pain, and ILD with NSIP pattern.  Needs bronchoscopy with BAL +/- TBBx to rule out infection prior to augmenting immunotherapy.  D/w Dr. Delton Coombes who will arrange for bronchoscopy.

## 2023-01-15 ENCOUNTER — Telehealth: Payer: Self-pay | Admitting: Emergency Medicine

## 2023-01-15 NOTE — Telephone Encounter (Signed)
Pt calling to get admission info for 01/26/2023 appt , pls advise

## 2023-01-19 NOTE — Telephone Encounter (Signed)
Left message informing patient admissions will call him.

## 2023-01-22 ENCOUNTER — Other Ambulatory Visit: Payer: Self-pay

## 2023-01-22 ENCOUNTER — Encounter (HOSPITAL_COMMUNITY): Payer: Self-pay | Admitting: Emergency Medicine

## 2023-01-22 NOTE — Progress Notes (Signed)
SDW call  Patient was given pre-op instructions over the phone. Patient verbalized understanding of instructions provided.     PCP - Dr. Georgianne Fick Cardiologist - denies Pulmonary:    PPM/ICD - denies Device Orders - n/a Rep Notified - n/a   Chest x-ray - 09/29/2022 EKG -  11/27/2022 Stress Test - ECHO - 09/30/2022 Cardiac Cath -   Sleep Study/sleep apnea/CPAP: denies  Non-diabetic   Blood Thinner Instructions: denies Aspirin Instructions:denies   ERAS Protcol - No, NPO   COVID TEST- n/a    Anesthesia review: No   Patient denies shortness of breath, fever, cough and chest pain over the phone call  Your procedure is scheduled on Monday January 26, 2023  Report to [facility] Main Entrance "A" at  1010  A.M., then check in with the Admitting office.  Call this number if you have problems the morning of surgery:  [phone]   If you have any questions prior to your surgery date call [phone]: Open Monday-Friday 8am-4pm If you experience any cold or flu symptoms such as cough, fever, chills, shortness of breath, etc. between now and your scheduled surgery, please notify us at the above number     Remember:  Do not eat or drink after midnight the night before your surgery  Take these medicines the morning of surgery with A SIP OF WATER:  Norvasc, restasis eye drops  As needed: zanaflex  As of today, STOP taking any Aspirin (unless otherwise instructed by your surgeon) Aleve, Naproxen, Ibuprofen, Motrin, Advil, Goody's, BC's, all herbal medications, fish oil, and all vitamins. This includes your Celebrex

## 2023-01-26 ENCOUNTER — Ambulatory Visit (HOSPITAL_COMMUNITY): Payer: BC Managed Care – PPO

## 2023-01-26 ENCOUNTER — Other Ambulatory Visit: Payer: Self-pay

## 2023-01-26 ENCOUNTER — Encounter (HOSPITAL_COMMUNITY)
Admission: RE | Disposition: A | Discharge status: Home / Self Care | Payer: Self-pay | Source: Home / Self Care | Attending: Emergency Medicine

## 2023-01-26 ENCOUNTER — Ambulatory Visit (HOSPITAL_COMMUNITY)
Admission: RE | Admit: 2023-01-26 | Discharge: 2023-01-26 | Disposition: A | Discharge status: Home / Self Care | Payer: BC Managed Care – PPO | Source: Home / Self Care | Attending: Emergency Medicine | Admitting: Emergency Medicine

## 2023-01-26 ENCOUNTER — Ambulatory Visit (HOSPITAL_COMMUNITY): Payer: BC Managed Care – PPO | Admitting: Anesthesiology

## 2023-01-26 ENCOUNTER — Encounter (HOSPITAL_COMMUNITY): Payer: Self-pay | Admitting: Emergency Medicine

## 2023-01-26 DIAGNOSIS — M069 Rheumatoid arthritis, unspecified: Secondary | ICD-10-CM | POA: Diagnosis not present

## 2023-01-26 DIAGNOSIS — R918 Other nonspecific abnormal finding of lung field: Secondary | ICD-10-CM

## 2023-01-26 DIAGNOSIS — I10 Essential (primary) hypertension: Secondary | ICD-10-CM | POA: Diagnosis not present

## 2023-01-26 DIAGNOSIS — J849 Interstitial pulmonary disease, unspecified: Secondary | ICD-10-CM | POA: Insufficient documentation

## 2023-01-26 DIAGNOSIS — J811 Chronic pulmonary edema: Secondary | ICD-10-CM | POA: Diagnosis not present

## 2023-01-26 DIAGNOSIS — R0989 Other specified symptoms and signs involving the circulatory and respiratory systems: Secondary | ICD-10-CM | POA: Diagnosis not present

## 2023-01-26 DIAGNOSIS — Z48813 Encounter for surgical aftercare following surgery on the respiratory system: Secondary | ICD-10-CM | POA: Diagnosis not present

## 2023-01-26 DIAGNOSIS — R846 Abnormal cytological findings in specimens from respiratory organs and thorax: Secondary | ICD-10-CM | POA: Diagnosis not present

## 2023-01-26 HISTORY — PX: BRONCHIAL BIOPSY: SHX5109

## 2023-01-26 HISTORY — PX: BRONCHIAL WASHINGS: SHX5105

## 2023-01-26 HISTORY — PX: VIDEO BRONCHOSCOPY: SHX5072

## 2023-01-26 LAB — CBC
HCT: 45.6 % (ref 39.0–52.0)
Hemoglobin: 13.7 g/dL (ref 13.0–17.0)
MCH: 24.7 pg — ABNORMAL LOW (ref 26.0–34.0)
MCHC: 30 g/dL (ref 30.0–36.0)
MCV: 82.3 fL (ref 80.0–100.0)
Platelets: 335 10*3/uL (ref 150–400)
RBC: 5.54 MIL/uL (ref 4.22–5.81)
RDW: 16.8 % — ABNORMAL HIGH (ref 11.5–15.5)
WBC: 6.2 10*3/uL (ref 4.0–10.5)
nRBC: 0 % (ref 0.0–0.2)

## 2023-01-26 LAB — BASIC METABOLIC PANEL
Anion gap: 11 (ref 5–15)
BUN: 9 mg/dL (ref 6–20)
CO2: 21 mmol/L — ABNORMAL LOW (ref 22–32)
Calcium: 8.7 mg/dL — ABNORMAL LOW (ref 8.9–10.3)
Chloride: 103 mmol/L (ref 98–111)
Creatinine, Ser: 0.67 mg/dL (ref 0.61–1.24)
GFR, Estimated: >60 mL/min (ref >60)
Glucose, Bld: 92 mg/dL (ref 70–99)
Potassium: 4.2 mmol/L (ref 3.5–5.1)
Sodium: 135 mmol/L (ref 135–145)

## 2023-01-26 LAB — BODY FLUID CELL COUNT WITH DIFFERENTIAL
Eos, Fluid: 0 %
Lymphs, Fluid: 4 %
Monocyte-Macrophage-Serous Fluid: 36 % — ABNORMAL LOW (ref 50–90)
Neutrophil Count, Fluid: 60 % — ABNORMAL HIGH (ref 0–25)
Total Nucleated Cell Count, Fluid: 160 cu mm (ref 0–1000)

## 2023-01-26 SURGERY — BRONCHOSCOPY, WITH FLUOROSCOPY
Anesthesia: General | Laterality: Bilateral

## 2023-01-26 MED ORDER — LABETALOL HCL 5 MG/ML IV SOLN
10.0000 mg | Freq: Once | INTRAVENOUS | Status: AC
  Administered 2023-01-26: 10 mg via INTRAVENOUS
  Filled 2023-01-26: qty 4

## 2023-01-26 MED ORDER — GLYCOPYRROLATE 0.2 MG/ML IJ SOLN
INTRAMUSCULAR | Status: DC | PRN
Start: 2023-01-26 — End: 2023-01-26
  Administered 2023-01-26: .2 mg via INTRAVENOUS

## 2023-01-26 MED ORDER — ONDANSETRON HCL 4 MG/2ML IJ SOLN
INTRAMUSCULAR | Status: DC | PRN
Start: 2023-01-26 — End: 2023-01-26
  Administered 2023-01-26 (×2): 4 mg via INTRAVENOUS

## 2023-01-26 MED ORDER — ESMOLOL HCL 100 MG/10ML IV SOLN
INTRAVENOUS | Status: DC | PRN
  Administered 2023-01-26: 30 mg via INTRAVENOUS

## 2023-01-26 MED ORDER — CHLORHEXIDINE GLUCONATE 0.12 % MT SOLN
OROMUCOSAL | Status: AC
  Administered 2023-01-26: 15 mL
  Filled 2023-01-26: qty 15

## 2023-01-26 MED ORDER — DEXAMETHASONE SODIUM PHOSPHATE 10 MG/ML IJ SOLN
INTRAMUSCULAR | Status: DC | PRN
  Administered 2023-01-26: 10 mg via INTRAVENOUS

## 2023-01-26 MED ORDER — PROPOFOL 10 MG/ML IV BOLUS
INTRAVENOUS | Status: DC | PRN
  Administered 2023-01-26: 20 mg via INTRAVENOUS
  Administered 2023-01-26: 150 mg via INTRAVENOUS
  Administered 2023-01-26: 30 mg via INTRAVENOUS

## 2023-01-26 MED ORDER — SUGAMMADEX SODIUM 200 MG/2ML IV SOLN
INTRAVENOUS | Status: DC | PRN
  Administered 2023-01-26: 200 mg via INTRAVENOUS

## 2023-01-26 MED ORDER — LACTATED RINGERS IV SOLN: INTRAVENOUS | Status: DC

## 2023-01-26 MED ORDER — LIDOCAINE 2% (20 MG/ML) 5 ML SYRINGE
INTRAMUSCULAR | Status: DC | PRN
  Administered 2023-01-26 (×2): 20 mg via INTRAVENOUS
  Administered 2023-01-26: 60 mg via INTRAVENOUS

## 2023-01-26 MED ORDER — FENTANYL CITRATE (PF) 100 MCG/2ML IJ SOLN: 25.0000 ug | INTRAMUSCULAR | Status: DC | PRN

## 2023-01-26 MED ORDER — ROCURONIUM BROMIDE 10 MG/ML (PF) SYRINGE
PREFILLED_SYRINGE | INTRAVENOUS | Status: DC | PRN
  Administered 2023-01-26: 60 mg via INTRAVENOUS

## 2023-01-26 MED ORDER — ACETAMINOPHEN 10 MG/ML IV SOLN: 1000.0000 mg | Freq: Once | INTRAVENOUS | Status: DC | PRN

## 2023-01-26 MED ORDER — FENTANYL CITRATE (PF) 100 MCG/2ML IJ SOLN
INTRAMUSCULAR | Status: AC
  Filled 2023-01-26: qty 2

## 2023-01-26 NOTE — Interval H&P Note (Signed)
History and Physical Interval Note:  01/26/2023 10:55 AM  Anthony Boyd  has presented today for surgery, with the diagnosis of INTERSTITIAL LUNG DEASEASE.  The various methods of treatment have been discussed with the patient and family. After consideration of risks, benefits and other options for treatment, the patient has consented to  Procedure(s): VIDEO BRONCHOSCOPY WITH FLUORO (Bilateral) as a surgical intervention.  The patient's history has been reviewed, patient examined, no change in status, stable for surgery.  I have reviewed the patient's chart and labs.  Questions were answered to the patient's satisfaction.     Leslye Peer

## 2023-01-26 NOTE — Anesthesia Procedure Notes (Signed)
Procedure Name: Intubation Date/Time: 01/26/2023 1:21 PM  Performed by: Stanton Kidney, CRNAPre-anesthesia Checklist: Patient identified, Patient being monitored, Timeout performed, Emergency Drugs available and Suction available Patient Re-evaluated:Patient Re-evaluated prior to induction Oxygen Delivery Method: Circle system utilized Preoxygenation: Pre-oxygenation with 100% oxygen Induction Type: IV induction Ventilation: Mask ventilation without difficulty Laryngoscope Size: Mac and 3 Grade View: Grade I Tube type: Oral Tube size: 7.0 mm Number of attempts: 1 Airway Equipment and Method: Stylet Placement Confirmation: ETT inserted through vocal cords under direct vision, positive ETCO2 and breath sounds checked- equal and bilateral Secured at: 21 cm Tube secured with: Tape Dental Injury: Teeth and Oropharynx as per pre-operative assessment and Injury to lip  Comments: Small upper lip abrasion

## 2023-01-26 NOTE — Anesthesia Postprocedure Evaluation (Signed)
Anesthesia Post Note  Patient: Montez Morita Romano  Procedure(s) Performed: VIDEO BRONCHOSCOPY WITH FLUORO (Bilateral) BRONCHIAL BIOPSIES BRONCHIAL WASHINGS     Patient location during evaluation: PACU Anesthesia Type: General Level of consciousness: awake and alert Pain management: pain level controlled Vital Signs Assessment: post-procedure vital signs reviewed and stable Respiratory status: spontaneous breathing, nonlabored ventilation, respiratory function stable and patient connected to nasal cannula oxygen Cardiovascular status: blood pressure returned to baseline and stable Postop Assessment: no apparent nausea or vomiting Anesthetic complications: no   No notable events documented.  Last Vitals:  Vitals:   01/26/23 1415 01/26/23 1430  BP: (!) 145/90 (!) 148/92  Pulse: 78 77  Resp: (!) 21 (!) 24  Temp:  36.8 C  SpO2: 98% 95%    Last Pain:  Vitals:   01/26/23 1430  PainSc: 0-No pain                 Earl Lites P Alegandro Macnaughton

## 2023-01-26 NOTE — Op Note (Signed)
Video Bronchoscopy Procedure Note  Date of Operation: 01/26/2023  Pre-op Diagnosis: Bilateral pulmonary infiltrates  Post-op Diagnosis: Same  Surgeon: Levy Pupa  Assistants: none  Anesthesia: General anesthesia  Operation: Flexible video fiberoptic bronchoscopy and biopsies.  Estimated Blood Loss: Minimal  Complications: none noted  Indications and History: Anthony Boyd is 57 y.o. with history of rheumatoid arthritis and interstitial infiltrates on chest Chest CT.  Recommendation was to perform video fiberoptic bronchoscopy with biopsies and BAL. The risks, benefits, complications, treatment options and expected outcomes were discussed with the patient.  The possibilities of pneumothorax, pneumonia, reaction to medication, pulmonary aspiration, perforation of a viscus, bleeding, failure to diagnose a condition and creating a complication requiring transfusion or operation were discussed with the patient who freely signed the consent.    Description of Procedure: The patient was seen in the Preoperative Area, was examined and was deemed appropriate to proceed.  The patient was taken to [facility] endoscopy room 3, identified as Anthony Boyd and the procedure verified as Flexible Video Fiberoptic Bronchoscopy.  A Time Out was held and the above information confirmed.   General anesthesia was initiated and the patient was endotracheally intubated.  The video fiberoptic bronchoscope was introduced via the ET tube and a general inspection was performed which showed normal trachea, normal main carina. The R sided airways were inspected and showed normal RUL, BI, RML and RLL. The L side was then inspected. The LLL, Lingular and LUL airways were normal.   BAL was performed in the lingula with 60 cc normal saline instilled and approximately 25 cc returned.  This was sent for cell count and microbiology.  Under fluoroscopic guidance transbronchial biopsies were performed in the left  lower lobe to be sent for cytology/pathology.  The patient tolerated the procedure well. The bronchoscope was removed. There were no obvious complications.   Samples: 1.  Bronchoalveolar lavage from the lingula 2.  Transbronchial forceps biopsies from the left lower lobe  Plans:  We will review the cytology, pathology and microbiology results with the patient when they become available.  Outpatient followup will be with Dr Craige Cotta.    Levy Pupa, MD, PhD 01/26/2023, 1:39 PM [facility] [phone] or if no answer [phone]

## 2023-01-26 NOTE — Anesthesia Preprocedure Evaluation (Addendum)
Anesthesia Evaluation  Patient identified by MRN, date of birth, ID band Patient awake    Reviewed: Allergy & Precautions, NPO status , Patient's Chart, lab work & pertinent test results  Airway Mallampati: II  TM Distance: >3 FB Neck ROM: Full    Dental no notable dental hx.    Pulmonary neg pulmonary ROS   Pulmonary exam normal        Cardiovascular hypertension, Pt. on medications  Rhythm:Regular Rate:Normal     Neuro/Psych negative neurological ROS  negative psych ROS   GI/Hepatic Neg liver ROS,GERD  Medicated,,  Endo/Other  negative endocrine ROS    Renal/GU negative Renal ROS  negative genitourinary   Musculoskeletal  (+) Arthritis , Osteoarthritis,    Abdominal Normal abdominal exam  (+)   Peds  Hematology Lab Results      Component                Value               Date                      WBC                      6.2                 01/26/2023                HGB                      13.7                01/26/2023                HCT                      45.6                01/26/2023                MCV                      82.3                01/26/2023                PLT                      335                 01/26/2023             Lab Results      Component                Value               Date                      NA                       135                 01/26/2023                K  4.2                 01/26/2023                CO2                      21 (L)              01/26/2023                GLUCOSE                  92                  01/26/2023                BUN                      9                   01/26/2023                CREATININE               0.67                01/26/2023                CALCIUM                  8.7 (L)             01/26/2023                GFRNONAA                 >60                 01/26/2023              Anesthesia Other  Findings   Reproductive/Obstetrics                             Anesthesia Physical Anesthesia Plan  ASA: 2  Anesthesia Plan: General   Post-op Pain Management:    Induction: Intravenous  PONV Risk Score and Plan: 2 and Ondansetron, Dexamethasone, Midazolam and Treatment may vary due to age or medical condition  Airway Management Planned: Mask and Oral ETT  Additional Equipment: None  Intra-op Plan:   Post-operative Plan: Extubation in OR  Informed Consent: I have reviewed the patients History and Physical, chart, labs and discussed the procedure including the risks, benefits and alternatives for the proposed anesthesia with the patient or authorized representative who has indicated his/her understanding and acceptance.     Dental advisory given  Plan Discussed with: CRNA  Anesthesia Plan Comments:        Anesthesia Quick Evaluation

## 2023-01-26 NOTE — Discharge Instructions (Addendum)
Flexible Bronchoscopy, Care After This sheet gives you information about how to care for yourself after your test. Your doctor may also give you more specific instructions. If you have problems or questions, contact your doctor. Follow these instructions at home: Eating and drinking When your numbness is gone and your cough and gag reflexes have come back, you may: Eat only soft foods. Slowly drink liquids. The day after the test, go back to your normal diet. Driving Do not drive for 24 hours if you were given a medicine to help you relax (sedative). Do not drive or use heavy machinery while taking prescription pain medicine. General instructions  Take over-the-counter and prescription medicines only as told by your doctor. Return to your normal activities as told. Ask what activities are safe for you. Do not use any products that have nicotine or tobacco in them. This includes cigarettes and e-cigarettes. If you need help quitting, ask your doctor. Keep all follow-up visits as told by your doctor. This is important. It is very important if you had a tissue sample (biopsy) taken. Get help right away if: You have shortness of breath that gets worse. You get light-headed. You feel like you are going to pass out (faint). You have chest pain. You cough up: More than a little blood. More blood than before. Summary Do not eat or drink anything (not even water) for 2 hours after your test, or until your numbing medicine wears off. Do not use cigarettes. Do not use e-cigarettes. Get help right away if you have chest pain.  Please call our office for any questions or concerns.  [phone].  This information is not intended to replace advice given to you by your health care provider. Make sure you discuss any questions you have with your health care provider. Document Released: 03/23/2009 Document Revised: 05/08/2017 Document Reviewed: 06/13/2016 Elsevier Patient Education  2020 Elsevier  Inc.  

## 2023-01-26 NOTE — Transfer of Care (Signed)
Immediate Anesthesia Transfer of Care Note  Patient: Anthony Boyd  Procedure(s) Performed: VIDEO BRONCHOSCOPY WITH FLUORO (Bilateral) BRONCHIAL BIOPSIES BRONCHIAL WASHINGS  Patient Location: PACU  Anesthesia Type:General  Level of Consciousness: awake, alert , and oriented  Airway & Oxygen Therapy: Patient Spontanous Breathing  Post-op Assessment: Report given to RN and Post -op Vital signs reviewed and stable  Post vital signs: Reviewed and stable  Last Vitals:  Vitals Value Taken Time  BP 142/89 01/26/23 1348  Temp 36.8 C 01/26/23 1348  Pulse 82 01/26/23 1352  Resp 17 01/26/23 1352  SpO2 97 % 01/26/23 1352  Vitals shown include unfiled device data.  Last Pain:  Vitals:   01/26/23 1348  PainSc: 0-No pain         Complications: No notable events documented.

## 2023-01-28 LAB — CYTOLOGY - NON PAP

## 2023-01-29 LAB — CULTURE, BAL-QUANTITATIVE W GRAM STAIN: Culture: 10000 — AB

## 2023-01-30 ENCOUNTER — Encounter (HOSPITAL_COMMUNITY): Payer: Self-pay | Admitting: Emergency Medicine

## 2023-01-30 LAB — ACID FAST SMEAR (AFB, MYCOBACTERIA): Acid Fast Smear: NEGATIVE

## 2023-01-31 LAB — ANAEROBIC CULTURE W GRAM STAIN

## 2023-02-10 ENCOUNTER — Telehealth (HOSPITAL_BASED_OUTPATIENT_CLINIC_OR_DEPARTMENT_OTHER): Payer: Self-pay | Admitting: Pulmonary Disease

## 2023-02-10 NOTE — Telephone Encounter (Signed)
Pt. Called back to speak to Dr. Delton Coombes about his result

## 2023-02-10 NOTE — Telephone Encounter (Signed)
Patient calling in regards of his results from his Bronchoscopy from 01/26/23. Please advise and call patient back.

## 2023-02-10 NOTE — Telephone Encounter (Signed)
Call patient discussed his bronchoscopy results.  No answer.  Left a voicemail that we can try him back

## 2023-02-11 NOTE — Telephone Encounter (Signed)
Called the pt again - no answer, left VM and will call him back

## 2023-02-16 NOTE — Telephone Encounter (Signed)
Called the patient again, no answer.  Left him a detailed voicemail to describe his BAL culture results: Bacterial culture negative, fungal culture negative, AFB culture is still not finalized but is negative so far.  I asked him to call me back with any questions.

## 2023-02-18 DIAGNOSIS — M0579 Rheumatoid arthritis with rheumatoid factor of multiple sites without organ or systems involvement: Secondary | ICD-10-CM | POA: Diagnosis not present

## 2023-02-24 DIAGNOSIS — M0579 Rheumatoid arthritis with rheumatoid factor of multiple sites without organ or systems involvement: Secondary | ICD-10-CM | POA: Diagnosis not present

## 2023-02-24 DIAGNOSIS — M25572 Pain in left ankle and joints of left foot: Secondary | ICD-10-CM | POA: Diagnosis not present

## 2023-02-24 DIAGNOSIS — Z79899 Other long term (current) drug therapy: Secondary | ICD-10-CM | POA: Diagnosis not present

## 2023-02-24 DIAGNOSIS — M79672 Pain in left foot: Secondary | ICD-10-CM | POA: Diagnosis not present

## 2023-02-24 DIAGNOSIS — M79641 Pain in right hand: Secondary | ICD-10-CM | POA: Diagnosis not present

## 2023-02-24 DIAGNOSIS — M25571 Pain in right ankle and joints of right foot: Secondary | ICD-10-CM | POA: Diagnosis not present

## 2023-02-24 DIAGNOSIS — M79642 Pain in left hand: Secondary | ICD-10-CM | POA: Diagnosis not present

## 2023-02-24 DIAGNOSIS — M79643 Pain in unspecified hand: Secondary | ICD-10-CM | POA: Diagnosis not present

## 2023-02-24 DIAGNOSIS — M79671 Pain in right foot: Secondary | ICD-10-CM | POA: Diagnosis not present

## 2023-02-25 ENCOUNTER — Ambulatory Visit (HOSPITAL_BASED_OUTPATIENT_CLINIC_OR_DEPARTMENT_OTHER): Payer: BC Managed Care – PPO | Admitting: Pulmonary Disease

## 2023-02-25 ENCOUNTER — Encounter (HOSPITAL_BASED_OUTPATIENT_CLINIC_OR_DEPARTMENT_OTHER): Payer: Self-pay | Admitting: Pulmonary Disease

## 2023-02-25 VITALS — BP 144/90 | HR 97 | Ht 67.0 in | Wt 221.0 lb

## 2023-02-25 DIAGNOSIS — R0609 Other forms of dyspnea: Secondary | ICD-10-CM

## 2023-02-25 DIAGNOSIS — J849 Interstitial pulmonary disease, unspecified: Secondary | ICD-10-CM

## 2023-02-25 DIAGNOSIS — M0510 Rheumatoid lung disease with rheumatoid arthritis of unspecified site: Secondary | ICD-10-CM

## 2023-02-25 LAB — PULMONARY FUNCTION TEST
DL/VA % pred: 155 %
DL/VA: 6.83 ml/min/mmHg/L
DLCO cor % pred: 101 %
DLCO cor: 24.78 ml/min/mmHg
DLCO unc % pred: 98 %
DLCO unc: 24.13 ml/min/mmHg
FEF 25-75 Post: 1.06 L/sec
FEF 25-75 Pre: 0.89 L/sec
FEF2575-%Change-Post: 19 %
FEF2575-%Pred-Post: 39 %
FEF2575-%Pred-Pre: 32 %
FEV1-%Change-Post: 1 %
FEV1-%Pred-Post: 48 %
FEV1-%Pred-Pre: 47 %
FEV1-Post: 1.53 L
FEV1-Pre: 1.5 L
FEV1FVC-%Change-Post: 4 %
FEV1FVC-%Pred-Pre: 92 %
FEV6-%Change-Post: 0 %
FEV6-%Pred-Post: 52 %
FEV6-%Pred-Pre: 51 %
FEV6-Post: 2.06 L
FEV6-Pre: 2.05 L
FEV6FVC-%Change-Post: 3 %
FEV6FVC-%Pred-Post: 104 %
FEV6FVC-%Pred-Pre: 101 %
FVC-%Change-Post: -2 %
FVC-%Pred-Post: 49 %
FVC-%Pred-Pre: 51 %
FVC-Post: 2.06 L
FVC-Pre: 2.12 L
Post FEV1/FVC ratio: 74 %
Post FEV6/FVC ratio: 100 %
Pre FEV1/FVC ratio: 71 %
Pre FEV6/FVC Ratio: 97 %
RV % pred: 100 %
RV: 1.94 L
TLC % pred: 69 %
TLC: 4.21 L

## 2023-02-25 NOTE — Patient Instructions (Signed)
Follow up in 2 months

## 2023-02-25 NOTE — Patient Instructions (Signed)
Full PFT Performed Today

## 2023-02-25 NOTE — Progress Notes (Signed)
[facility]  Chief Complaint  Patient presents with   Medical Management of Chronic Issues    Past Surgical History:  He  has a past surgical history that includes Foot surgery (1998, 2001); Knee surgery (2003); Total knee arthroplasty (Left, 03/04/2019); Umbilical hernia repair (N/A, 04/29/2022); Insertion of mesh (N/A, 04/29/2022); Video bronchoscopy (Bilateral, 01/26/2023); Bronchial biopsy (01/26/2023); and Bronchial washings (01/26/2023).  Past Medical History:  Allergies, Rheumatoid arthritis, HTN, Osteoporosis  Constitutional:  BP (!) 144/90   Pulse 97   Ht 5\' 7"  (1.702 m)   Wt 221 lb (100.2 kg)   SpO2 97%   BMI 34.61 kg/m   Brief Summary:  Anthony Boyd is a 57 y.o. male with dyspnea and pleuritic chest pain.  He has ILD with NSIP pattern and history of rheumatoid arthritis.      Subjective:   Had bronchoscopy with Dr. Delton Coombes on 01/26/23.  Cytology negative, cultures negative to date.  Still has cough and gets winded easily.  Hasn't been able to return to work.  He thinks he will need to apply for long term disability.  Physical Exam:   Appearance - well kempt   ENMT - no sinus tenderness, no oral exudate, no LAN, Mallampati 3 airway, no stridor  Respiratory - basilar crackles  CV - s1s2 regular rate and rhythm, no murmurs  Ext - no clubbing, no edema  Skin - no rashes  Psych - normal mood and affect   Pulmonary testing:  Bronchoscopy 01/26/23 >> cytology, cultures negative PFT 02/25/23 >> FEV1 1.50 (47%), FEV1% 71, TLC 4.21 (69%), DLCO 98%  Chest Imaging:  CT chest 09/29/22 >> minimal Lt pleural effusion CT chest 11/27/22 >> Lt > Rt loculated effusions, focal BTX RUL, scattered areas of scarring CT chest 12/22/22 >> mild peribronchovascular and peripheral subpleural nodularity. Favor subtle areas of traction bronchiectasis. Basilar interstitial and septal thickening. Relatively thin rind of pleural fluid in the  lower right hemithorax, indicative of chronicity. Small left pleural effusion, decreased from 11/27/2022.  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  Cardiac Tests:  Echo 09/30/22 >> EF 60 to 65%, grade 1 DD, mild MR  Social History:  He  reports that he has never smoked. He has never used smokeless tobacco. He reports that he does not drink alcohol and does not use drugs.  Family History:  His family history includes Hypertension in his father and mother.     Assessment/Plan:   Interstitial lung disease. 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 - followed by Dr. Haydee Salter with rheumatology  Loculated pleural effusions. - not enough to warrant thoracentesis based on IR ultrasound imaging from 12/15/22 - decreased size on CT chest imaging from 12/22/22 - could be related to rheumatoid arthritis also  Time Spent Involved in Patient Care on Day of Examination:  38 minutes  Follow up:   Patient Instructions  Follow up in 2 months  Medication List:   Allergies as of 02/25/2023   No Known Allergies      Medication  List        Accurate as of February 25, 2023  4:59 PM. If you have any questions, ask your nurse or doctor.          STOP taking these medications    UNKNOWN TO PATIENT Stopped by: Coralyn Helling       TAKE these medications    amLODipine 10 MG tablet Commonly known as: NORVASC Take 10 mg by mouth daily.   celecoxib 200 MG capsule Commonly known as: CELEBREX Take 200 mg by  mouth 2 (two) times daily.   cycloSPORINE 0.05 % ophthalmic emulsion Commonly known as: Restasis Place 1 drop into both eyes 2 (two) times daily.   folic acid 1 MG tablet Commonly known as: FOLVITE Take 1 mg by mouth daily.   irbesartan 300 MG tablet Commonly known as: AVAPRO Take 1 tablet (300 mg total) by mouth daily.   rosuvastatin 20 MG tablet Commonly known as: CRESTOR Take 20 mg by mouth at bedtime.   Simponi Aria 50 MG/4ML Soln injection Generic drug: golimumab Inject 50 mg into the vein every 8 (eight) weeks.   tiZANidine 2 MG tablet Commonly known as: ZANAFLEX Take 1 tablet (2 mg total) by mouth 3 (three) times daily as needed.   Tums 500 MG chewable tablet Generic drug: calcium carbonate Chew 1-3 tablets by mouth daily as needed for indigestion or heartburn.        Signature:  Coralyn Helling, MD [facility] Pager - [phone] 02/25/2023, 4:59 PM

## 2023-02-25 NOTE — Progress Notes (Signed)
Full PFT Performed Today  

## 2023-02-27 ENCOUNTER — Telehealth (HOSPITAL_BASED_OUTPATIENT_CLINIC_OR_DEPARTMENT_OTHER): Payer: Self-pay | Admitting: Pulmonary Disease

## 2023-02-27 DIAGNOSIS — Z0289 Encounter for other administrative examinations: Secondary | ICD-10-CM

## 2023-02-27 NOTE — Telephone Encounter (Signed)
Patient dropped off disability claim for Dr. Craige Cotta to fill out which has been completed and faxing to [employer].

## 2023-03-01 LAB — FUNGUS CULTURE WITH STAIN

## 2023-03-01 LAB — FUNGUS CULTURE RESULT

## 2023-03-01 LAB — FUNGAL ORGANISM REFLEX

## 2023-03-02 ENCOUNTER — Telehealth: Payer: Self-pay | Admitting: Pulmonary Disease

## 2023-03-02 NOTE — Telephone Encounter (Signed)
Disability form has been faxed to [employer] - fax# [phone]  Attn:  Amedeo Gory.   Included medical records going back to July 2024.

## 2023-03-03 NOTE — Telephone Encounter (Signed)
Patient came by in person and wanted a copy of filled out disability forms. Patient given a copy and has paid $29 processing fee.

## 2023-03-04 DIAGNOSIS — R7303 Prediabetes: Secondary | ICD-10-CM | POA: Diagnosis not present

## 2023-03-04 DIAGNOSIS — I10 Essential (primary) hypertension: Secondary | ICD-10-CM | POA: Diagnosis not present

## 2023-03-04 DIAGNOSIS — Z23 Encounter for immunization: Secondary | ICD-10-CM | POA: Diagnosis not present

## 2023-03-04 DIAGNOSIS — M0579 Rheumatoid arthritis with rheumatoid factor of multiple sites without organ or systems involvement: Secondary | ICD-10-CM | POA: Diagnosis not present

## 2023-03-12 LAB — ACID FAST CULTURE WITH REFLEXED SENSITIVITIES (MYCOBACTERIA): Acid Fast Culture: NEGATIVE

## 2023-04-15 DIAGNOSIS — M0579 Rheumatoid arthritis with rheumatoid factor of multiple sites without organ or systems involvement: Secondary | ICD-10-CM | POA: Diagnosis not present

## 2023-04-22 DIAGNOSIS — J8489 Other specified interstitial pulmonary diseases: Secondary | ICD-10-CM | POA: Diagnosis not present

## 2023-04-22 DIAGNOSIS — J849 Interstitial pulmonary disease, unspecified: Secondary | ICD-10-CM | POA: Diagnosis not present

## 2023-04-22 DIAGNOSIS — R053 Chronic cough: Secondary | ICD-10-CM | POA: Diagnosis not present

## 2023-04-22 DIAGNOSIS — M0510 Rheumatoid lung disease with rheumatoid arthritis of unspecified site: Secondary | ICD-10-CM | POA: Diagnosis not present

## 2023-07-14 DIAGNOSIS — M25539 Pain in unspecified wrist: Secondary | ICD-10-CM | POA: Diagnosis not present

## 2023-07-14 DIAGNOSIS — Z79899 Other long term (current) drug therapy: Secondary | ICD-10-CM | POA: Diagnosis not present

## 2023-07-14 DIAGNOSIS — I10 Essential (primary) hypertension: Secondary | ICD-10-CM | POA: Diagnosis not present

## 2023-07-14 DIAGNOSIS — M79643 Pain in unspecified hand: Secondary | ICD-10-CM | POA: Diagnosis not present

## 2023-07-14 DIAGNOSIS — M25561 Pain in right knee: Secondary | ICD-10-CM | POA: Diagnosis not present

## 2023-07-14 DIAGNOSIS — M0579 Rheumatoid arthritis with rheumatoid factor of multiple sites without organ or systems involvement: Secondary | ICD-10-CM | POA: Diagnosis not present

## 2023-07-15 DIAGNOSIS — M069 Rheumatoid arthritis, unspecified: Secondary | ICD-10-CM | POA: Diagnosis not present

## 2023-07-28 DIAGNOSIS — M069 Rheumatoid arthritis, unspecified: Secondary | ICD-10-CM | POA: Diagnosis not present

## 2023-07-31 DIAGNOSIS — M0579 Rheumatoid arthritis with rheumatoid factor of multiple sites without organ or systems involvement: Secondary | ICD-10-CM | POA: Diagnosis not present

## 2023-09-01 DIAGNOSIS — M0579 Rheumatoid arthritis with rheumatoid factor of multiple sites without organ or systems involvement: Secondary | ICD-10-CM | POA: Diagnosis not present

## 2023-09-01 DIAGNOSIS — Z79899 Other long term (current) drug therapy: Secondary | ICD-10-CM | POA: Diagnosis not present

## 2023-09-01 DIAGNOSIS — M25561 Pain in right knee: Secondary | ICD-10-CM | POA: Diagnosis not present

## 2023-09-01 DIAGNOSIS — I10 Essential (primary) hypertension: Secondary | ICD-10-CM | POA: Diagnosis not present

## 2023-09-01 DIAGNOSIS — M79643 Pain in unspecified hand: Secondary | ICD-10-CM | POA: Diagnosis not present

## 2023-09-01 DIAGNOSIS — M25539 Pain in unspecified wrist: Secondary | ICD-10-CM | POA: Diagnosis not present

## 2023-09-03 DIAGNOSIS — J849 Interstitial pulmonary disease, unspecified: Secondary | ICD-10-CM | POA: Diagnosis not present

## 2023-09-03 DIAGNOSIS — R053 Chronic cough: Secondary | ICD-10-CM | POA: Diagnosis not present

## 2023-09-03 DIAGNOSIS — M0510 Rheumatoid lung disease with rheumatoid arthritis of unspecified site: Secondary | ICD-10-CM | POA: Diagnosis not present

## 2023-09-03 DIAGNOSIS — J8489 Other specified interstitial pulmonary diseases: Secondary | ICD-10-CM | POA: Diagnosis not present

## 2023-10-15 DIAGNOSIS — I10 Essential (primary) hypertension: Secondary | ICD-10-CM | POA: Diagnosis not present

## 2023-10-15 DIAGNOSIS — R7303 Prediabetes: Secondary | ICD-10-CM | POA: Diagnosis not present

## 2023-10-15 DIAGNOSIS — Z125 Encounter for screening for malignant neoplasm of prostate: Secondary | ICD-10-CM | POA: Diagnosis not present

## 2023-10-15 DIAGNOSIS — Z0000 Encounter for general adult medical examination without abnormal findings: Secondary | ICD-10-CM | POA: Diagnosis not present

## 2023-10-20 DIAGNOSIS — M25539 Pain in unspecified wrist: Secondary | ICD-10-CM | POA: Diagnosis not present

## 2023-10-20 DIAGNOSIS — M79643 Pain in unspecified hand: Secondary | ICD-10-CM | POA: Diagnosis not present

## 2023-10-20 DIAGNOSIS — M0579 Rheumatoid arthritis with rheumatoid factor of multiple sites without organ or systems involvement: Secondary | ICD-10-CM | POA: Diagnosis not present

## 2023-10-20 DIAGNOSIS — Z79899 Other long term (current) drug therapy: Secondary | ICD-10-CM | POA: Diagnosis not present

## 2023-10-20 DIAGNOSIS — M25561 Pain in right knee: Secondary | ICD-10-CM | POA: Diagnosis not present

## 2023-10-20 DIAGNOSIS — I10 Essential (primary) hypertension: Secondary | ICD-10-CM | POA: Diagnosis not present

## 2023-10-22 DIAGNOSIS — M0579 Rheumatoid arthritis with rheumatoid factor of multiple sites without organ or systems involvement: Secondary | ICD-10-CM | POA: Diagnosis not present

## 2023-10-22 DIAGNOSIS — Z0000 Encounter for general adult medical examination without abnormal findings: Secondary | ICD-10-CM | POA: Diagnosis not present

## 2023-10-22 DIAGNOSIS — R7303 Prediabetes: Secondary | ICD-10-CM | POA: Diagnosis not present

## 2023-10-22 DIAGNOSIS — E782 Mixed hyperlipidemia: Secondary | ICD-10-CM | POA: Diagnosis not present

## 2023-10-22 DIAGNOSIS — R7989 Other specified abnormal findings of blood chemistry: Secondary | ICD-10-CM | POA: Diagnosis not present

## 2023-10-22 DIAGNOSIS — I10 Essential (primary) hypertension: Secondary | ICD-10-CM | POA: Diagnosis not present

## 2023-11-03 DIAGNOSIS — M0579 Rheumatoid arthritis with rheumatoid factor of multiple sites without organ or systems involvement: Secondary | ICD-10-CM | POA: Diagnosis not present

## 2023-11-03 DIAGNOSIS — M069 Rheumatoid arthritis, unspecified: Secondary | ICD-10-CM | POA: Diagnosis not present

## 2023-11-05 DIAGNOSIS — M0579 Rheumatoid arthritis with rheumatoid factor of multiple sites without organ or systems involvement: Secondary | ICD-10-CM | POA: Diagnosis not present

## 2023-12-21 DIAGNOSIS — M069 Rheumatoid arthritis, unspecified: Secondary | ICD-10-CM | POA: Diagnosis not present

## 2023-12-21 DIAGNOSIS — M0579 Rheumatoid arthritis with rheumatoid factor of multiple sites without organ or systems involvement: Secondary | ICD-10-CM | POA: Diagnosis not present

## 2024-01-01 DIAGNOSIS — J849 Interstitial pulmonary disease, unspecified: Secondary | ICD-10-CM | POA: Diagnosis not present

## 2024-01-15 DIAGNOSIS — M79641 Pain in right hand: Secondary | ICD-10-CM | POA: Diagnosis not present

## 2024-01-15 DIAGNOSIS — M1990 Unspecified osteoarthritis, unspecified site: Secondary | ICD-10-CM | POA: Diagnosis not present

## 2024-01-15 DIAGNOSIS — Z79899 Other long term (current) drug therapy: Secondary | ICD-10-CM | POA: Diagnosis not present

## 2024-01-15 DIAGNOSIS — M0579 Rheumatoid arthritis with rheumatoid factor of multiple sites without organ or systems involvement: Secondary | ICD-10-CM | POA: Diagnosis not present

## 2024-01-15 DIAGNOSIS — M25539 Pain in unspecified wrist: Secondary | ICD-10-CM | POA: Diagnosis not present

## 2024-01-15 DIAGNOSIS — M79642 Pain in left hand: Secondary | ICD-10-CM | POA: Diagnosis not present

## 2024-01-20 DIAGNOSIS — M0510 Rheumatoid lung disease with rheumatoid arthritis of unspecified site: Secondary | ICD-10-CM | POA: Diagnosis not present

## 2024-01-20 DIAGNOSIS — J8489 Other specified interstitial pulmonary diseases: Secondary | ICD-10-CM | POA: Diagnosis not present

## 2024-01-20 DIAGNOSIS — J849 Interstitial pulmonary disease, unspecified: Secondary | ICD-10-CM | POA: Diagnosis not present

## 2024-02-05 ENCOUNTER — Ambulatory Visit: Admitting: Podiatry

## 2024-02-09 DIAGNOSIS — M0579 Rheumatoid arthritis with rheumatoid factor of multiple sites without organ or systems involvement: Secondary | ICD-10-CM | POA: Diagnosis not present

## 2024-02-09 DIAGNOSIS — M069 Rheumatoid arthritis, unspecified: Secondary | ICD-10-CM | POA: Diagnosis not present

## 2024-02-18 ENCOUNTER — Other Ambulatory Visit (INDEPENDENT_AMBULATORY_CARE_PROVIDER_SITE_OTHER): Payer: Self-pay

## 2024-02-18 ENCOUNTER — Ambulatory Visit: Admitting: Orthopedic Surgery

## 2024-02-18 DIAGNOSIS — M05732 Rheumatoid arthritis with rheumatoid factor of left wrist without organ or systems involvement: Secondary | ICD-10-CM

## 2024-02-18 DIAGNOSIS — G5621 Lesion of ulnar nerve, right upper limb: Secondary | ICD-10-CM | POA: Diagnosis not present

## 2024-02-18 DIAGNOSIS — M05731 Rheumatoid arthritis with rheumatoid factor of right wrist without organ or systems involvement: Secondary | ICD-10-CM | POA: Diagnosis not present

## 2024-02-18 DIAGNOSIS — M25521 Pain in right elbow: Secondary | ICD-10-CM

## 2024-02-18 NOTE — Progress Notes (Signed)
 Anthony Boyd - 58 y.o. male MRN [id number]  Date of birth: November 09, 1965  Office Visit Note: Visit Date: 02/18/2024 PCP: Verdia Lombard, MD Referred by: Mikel Leeroy Jenkins CHRISTELLA, MD  Subjective: Chief Complaint  Patient presents with   Left Hand - Pain   Right Hand - Pain   Left Wrist - Pain   Right Wrist - Pain   HPI: Anthony Boyd is a pleasant 58 y.o. male with history of rheumatoid arthritis currently on golimumab  who presents today for evaluation of right hand numbness in the ulnar aspect of the hand with associated clawing of the small finger that has been progressive now for multiple months.  Does have a history notable for trigger digit as well that responded to injection to the right long finger recently.  Has undergone previous electrodiagnostic study which did confirm right cubital tunnel syndrome.  States that the small finger has been in a slightly bent position now for multiple months, worsening in nature.  Has associated nocturnal symptoms as well.  He is here today for specific hand surgical evaluation.  Pertinent ROS were reviewed with the patient and found to be negative unless otherwise specified above in HPI.   Visit Reason: Bil hand/wrist pain Duration of symptoms: Years Hand dominance: right Occupation: Retired Diabetic: No Smoking: No Heart/Lung History: Fluid in Lungs, rheumatoid arthritis (golimumab ) Blood Thinners: No  Prior Testing/EMG: yes Injections (Date): August 2025 Treatments: injections Prior Surgery: No     Assessment & Plan: Visit Diagnoses:  1. Pain in right elbow   2. Cubital tunnel syndrome on right   3. Rheumatoid arthritis involving both wrists with positive rheumatoid factor (HCC)     Plan: Extensive discussion was had with the patient today regarding his multitude of upper extremity complaints.  We discussed the underlying rheumatoid arthritis throughout the hands and wrists, and please see that he is on a disease  modifying antirheumatic drug which should hopefully help with temporizing symptoms in these regions and preventing ongoing joint destruction.  I reviewed in detail his electrodiagnostic study which does show right sided cubital tunnel syndrome.  This is significant given his ongoing numbness as well as his progressive clawing in the right small finger.  At this juncture, he does have some MP hyperextension and PIP flexion consistent with progressive claw of the small finger and both clinical and electrodiagnostic evidence to confirm cubital tunnel syndrome.  He is indicated at this time for right cubital tunnel release with possible nerve transposition to be performed in the outpatient setting.  I would like to confirm with his rheumatologist that we can proceed with surgery while he is on the disease modifying antiemetic drug.  Would prefer at least 4 weeks preop to not have gotten the golimumab .  He states that he receives it every 6 weeks.  Will have surgery scheduling coordinate this.  Risks and benefits of the procedure were discussed, risks including but not limited to infection, bleeding, scarring, stiffness, nerve injury, tendon injury, vascular injury, recurrence of symptoms and need for subsequent operation.  We also discussed the appropriate postoperative protocol and timeframe for return to activities and function.  Patient expressed understanding.  Will move forward with surgical scheduling of right cubital tunnel release and possible nerve transposition at the next available date per patient preference and appropriate rheumatology clearance.   Follow-up: No follow-ups on file.   Meds & Orders: No orders of the defined types were placed in this encounter.   Orders Placed  This Encounter  Procedures   XR Elbow 2 Views Right     Procedures: No procedures performed      Clinical History: No specialty comments available.  He reports that he has never smoked. He has never used smokeless  tobacco. No results for input(s): HGBA1C, LABURIC in the last 8760 hours.  Objective:   Vital Signs: There were no vitals taken for this visit.  Physical Exam  Gen: Well-appearing, in no acute distress; non-toxic CV: Regular Rate. Well-perfused. Warm.  Resp: Breathing unlabored on room air; no wheezing. Psych: Fluid speech in conversation; appropriate affect; normal thought process  Ortho Exam PHYSICAL EXAM:  General: Patient is well appearing and in no distress.   Skin and Muscle: No skin changes are apparent to upper extremities.   Range of Motion and Palpation Tests: Mobility is full about the elbows with flexion and extension.  No evidence of nerve subluxation at right elbow.  Forearm supination and pronation are 80/80 bilaterally.  Wrist flexion/extension is 65/55 bilaterally.  There is evidence of caput ulna bilateral.  Digital flexion and extension are full.  Thumb opposition is full to the base of the small fingers bilaterally.    Neurologic, Vascular, Motor: Sensation is diminished to light touch in the right ulnar nerve distribution.    Tinel sign cubital tunnel: Positive right Scratch collapse elbow: Positive right  Sensory right side 2-point discrimination (ring, small): 7-8 mm  Motor right side SF FDP: 5/5 Froment: Mildly positive right Wartenberg: Negative right FDI wasting: Negative right  There is evidence of MP hyperextension and PIP flexion of the small finger, passively correctable  Fingers pink and well perfused.  Capillary refill is brisk.     Lab Results  Component Value Date   HGBA1C 5.9 (H) 09/29/2022     Imaging: XR Elbow 2 Views Right Result Date: 02/18/2024 There is no evidence of fracture or dislocation. There is no evidence of arthropathy or other focal bone abnormality. Soft tissues are unremarkable.    Past Medical/Family/Surgical/Social History: Medications & Allergies reviewed per EMR, new medications updated. Patient Active  Problem List   Diagnosis Date Noted   Pulmonary infiltrates 01/26/2023   Chest pain 09/29/2022   Class 2 obesity 09/29/2022   Essential hypertension 07/18/2022   Morbid (severe) obesity due to excess calories (HCC) 07/18/2022   Pre-diabetes 07/18/2022   Rheumatoid arthritis of multiple sites without organ or system involvement with positive rheumatoid factor (HCC) 07/18/2022   Adult general medical examination 07/18/2022   Umbilical hernia without obstruction and without gangrene 07/18/2022   Status post total left knee replacement 03/04/2019   Unilateral primary osteoarthritis, left knee 09/08/2017   Unilateral primary osteoarthritis, right knee 09/08/2017   Past Medical History:  Diagnosis Date   Allergy    Arthritis    Knees, 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   Hypertension    Osteoporosis    Rheumatoid arthritis(714.0)    Family History  Problem Relation Age of Onset   Hypertension Mother    Hypertension Father    Past Surgical History:  Procedure Laterality Date   BRONCHIAL BIOPSY  01/26/2023   Procedure: BRONCHIAL BIOPSIES;  Surgeon: Shelah Lamar RAMAN, MD;  Location: [facility]COPY;  Service: Cardiopulmonary;;   BRONCHIAL WASHINGS  01/26/2023   Procedure: BRONCHIAL WASHINGS;  Surgeon: Shelah Lamar RAMAN, MD;  Location: [facility] ENDOSCOPY;  Service: Cardiopulmonary;;   FOOT SURGERY  1998, 2001   INSERTION OF MESH N/A 04/29/2022   Procedure: INSERTION OF MESH;  Surgeon: Belinda Cough, MD;  Location: [facility];  Service: General;  Laterality: N/A;   KNEE SURGERY  2003   left   TOTAL KNEE ARTHROPLASTY Left 03/04/2019   Procedure: LEFT TOTAL KNEE ARTHROPLASTY;  Surgeon: Vernetta Lonni GRADE, MD;  Location: [facility] ORS;  Service: Orthopedics;  Laterality: Left;   UMBILICAL HERNIA REPAIR N/A 04/29/2022   Procedure: OPEN UMBILICAL HERNIA REPAIR WITH MESH;  Surgeon: Belinda Cough, MD;  Location: [facility];  Service: General;  Laterality: N/A;   VIDEO BRONCHOSCOPY Bilateral  01/26/2023   Procedure: VIDEO BRONCHOSCOPY WITH FLUORO;  Surgeon: Shelah Lamar RAMAN, MD;  Location: [facility] ENDOSCOPY;  Service: Cardiopulmonary;  Laterality: Bilateral;   Social History   Occupational History   Not on file  Tobacco Use   Smoking status: Never   Smokeless tobacco: Never  Vaping Use   Vaping status: Never Used  Substance and Sexual Activity   Alcohol use: No   Drug use: No   Sexual activity: Not on file    Jacynda Brunke Estela) Arlinda, M.D. [facility], Hand Surgery]

## 2024-02-22 ENCOUNTER — Ambulatory Visit: Admitting: Podiatry

## 2024-02-22 ENCOUNTER — Ambulatory Visit (INDEPENDENT_AMBULATORY_CARE_PROVIDER_SITE_OTHER)

## 2024-02-22 ENCOUNTER — Encounter: Payer: Self-pay | Admitting: Podiatry

## 2024-02-22 ENCOUNTER — Telehealth: Payer: Self-pay | Admitting: Podiatry

## 2024-02-22 VITALS — Ht 65.5 in | Wt 222.0 lb

## 2024-02-22 DIAGNOSIS — M7751 Other enthesopathy of right foot: Secondary | ICD-10-CM | POA: Diagnosis not present

## 2024-02-22 MED ORDER — TRIAMCINOLONE ACETONIDE 10 MG/ML IJ SUSP
10.0000 mg | Freq: Once | INTRAMUSCULAR | Status: AC
Start: 2024-02-22 — End: 2024-02-22
  Administered 2024-02-22: 10 mg via INTRA_ARTICULAR

## 2024-02-22 NOTE — Telephone Encounter (Signed)
 Patient states the started IV medication on 02/11/24. Is it okay for him to get an injection at today's appointment ? He wants to make sure that the injection wont interfere with the IV.

## 2024-02-24 NOTE — Progress Notes (Signed)
 Subjective:   Patient ID: Koren JONETTA Bennett, male   DOB: 58 y.o.   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

## 2024-03-02 DIAGNOSIS — M0579 Rheumatoid arthritis with rheumatoid factor of multiple sites without organ or systems involvement: Secondary | ICD-10-CM | POA: Diagnosis not present

## 2024-03-02 DIAGNOSIS — J8489 Other specified interstitial pulmonary diseases: Secondary | ICD-10-CM | POA: Diagnosis not present

## 2024-03-02 DIAGNOSIS — M0510 Rheumatoid lung disease with rheumatoid arthritis of unspecified site: Secondary | ICD-10-CM | POA: Diagnosis not present

## 2024-03-02 DIAGNOSIS — Z23 Encounter for immunization: Secondary | ICD-10-CM | POA: Diagnosis not present

## 2024-03-02 DIAGNOSIS — J849 Interstitial pulmonary disease, unspecified: Secondary | ICD-10-CM | POA: Diagnosis not present

## 2024-03-30 DIAGNOSIS — M0579 Rheumatoid arthritis with rheumatoid factor of multiple sites without organ or systems involvement: Secondary | ICD-10-CM | POA: Diagnosis not present

## 2024-03-30 DIAGNOSIS — M069 Rheumatoid arthritis, unspecified: Secondary | ICD-10-CM | POA: Diagnosis not present

## 2024-04-01 DIAGNOSIS — M0579 Rheumatoid arthritis with rheumatoid factor of multiple sites without organ or systems involvement: Secondary | ICD-10-CM | POA: Diagnosis not present

## 2024-04-20 ENCOUNTER — Ambulatory Visit: Admitting: Orthopaedic Surgery

## 2024-04-20 DIAGNOSIS — M25561 Pain in right knee: Secondary | ICD-10-CM | POA: Diagnosis not present

## 2024-04-20 DIAGNOSIS — G8929 Other chronic pain: Secondary | ICD-10-CM

## 2024-04-20 DIAGNOSIS — M1711 Unilateral primary osteoarthritis, right knee: Secondary | ICD-10-CM | POA: Diagnosis not present

## 2024-04-20 MED ORDER — METHYLPREDNISOLONE ACETATE 40 MG/ML IJ SUSP
40.0000 mg | INTRAMUSCULAR | Status: AC | PRN
Start: 2024-04-20 — End: 2024-04-20
  Administered 2024-04-20: 40 mg via INTRA_ARTICULAR

## 2024-04-20 MED ORDER — LIDOCAINE HCL 1 % IJ SOLN
3.0000 mL | INTRAMUSCULAR | Status: AC | PRN
Start: 2024-04-20 — End: 2024-04-20
  Administered 2024-04-20: 3 mL

## 2024-04-20 NOTE — Progress Notes (Signed)
 The patient is a 58 year old we have seen him before.  He has well-documented severe arthritis of his right knee.  When he made this appointment about a month ago his knee was hurting bad enough to either have an injection or consider knee replacement surgery.  He does have a history of a left knee replacement.  He says the knee is now feeling somewhat better but he still wanted to come to be checked out.  He said no acute changes medical status.  He is traveling to [place]  in early December for a wedding.  He is not a diabetic.  Examination of his right knee shows no effusion today.  There is varus malalignment a lot of medial joint line tenderness and patellofemoral crepitation throughout the arc of motion of his knee.  Previous x-rays show severe end-stage bone-on-bone arthritis of the right knee.  We did discuss trying a steroid injection in his knee today since has been a very long time since he had a steroid injection.  He was actually agreeable to this since he does have a trip coming up and he wants to get through the holidays as well.  If the knee worsens at all in any way he knows to reach out to us .  He did tolerate the steroid injection well.  All questions and concerns were addressed and answered.    Procedure Note  Patient: Anthony Boyd             Date of Birth: 1966/05/28           MRN: [id number]             Visit Date: 04/20/2024  Procedures: Visit Diagnoses:  1. Unilateral primary osteoarthritis, right knee   2. Chronic pain of right knee     Large Joint Inj: R knee on 04/20/2024 9:51 AM Indications: diagnostic evaluation and pain Details: 22 G 1.5 in needle, superolateral approach  Arthrogram: No  Medications: 3 mL lidocaine  1 %; 40 mg methylPREDNISolone  acetate 40 MG/ML Outcome: tolerated well, no immediate complications Procedure, treatment alternatives, risks and benefits explained, specific risks discussed. Consent was given by the patient. Immediately prior to  procedure a time out was called to verify the correct patient, procedure, equipment, support staff and site/side marked as required. Patient was prepped and draped in the usual sterile fashion.

## 2024-05-12 DIAGNOSIS — J849 Interstitial pulmonary disease, unspecified: Secondary | ICD-10-CM | POA: Diagnosis not present

## 2024-05-12 DIAGNOSIS — M069 Rheumatoid arthritis, unspecified: Secondary | ICD-10-CM | POA: Diagnosis not present

## 2024-05-12 DIAGNOSIS — Z79899 Other long term (current) drug therapy: Secondary | ICD-10-CM | POA: Diagnosis not present

## 2024-05-12 DIAGNOSIS — M0579 Rheumatoid arthritis with rheumatoid factor of multiple sites without organ or systems involvement: Secondary | ICD-10-CM | POA: Diagnosis not present

## 2024-05-12 DIAGNOSIS — M1990 Unspecified osteoarthritis, unspecified site: Secondary | ICD-10-CM | POA: Diagnosis not present
